# Patient Record
Sex: Male | Born: 1965 | Race: White | Hispanic: No | Marital: Married | Smoking: Never smoker
Health system: Southern US, Community
[De-identification: ages and names within clinical notes are randomized; demographics above are authoritative.]

## PROBLEM LIST (undated history)

## (undated) DIAGNOSIS — E119 Type 2 diabetes mellitus without complications: Secondary | ICD-10-CM

## (undated) DIAGNOSIS — L719 Rosacea, unspecified: Secondary | ICD-10-CM

## (undated) DIAGNOSIS — E785 Hyperlipidemia, unspecified: Secondary | ICD-10-CM

## (undated) DIAGNOSIS — F419 Anxiety disorder, unspecified: Secondary | ICD-10-CM

## (undated) DIAGNOSIS — I10 Essential (primary) hypertension: Secondary | ICD-10-CM

## (undated) DIAGNOSIS — K219 Gastro-esophageal reflux disease without esophagitis: Secondary | ICD-10-CM

## (undated) DIAGNOSIS — T7840XA Allergy, unspecified, initial encounter: Secondary | ICD-10-CM

## (undated) DIAGNOSIS — H269 Unspecified cataract: Secondary | ICD-10-CM

## (undated) HISTORY — DX: Allergy, unspecified, initial encounter: T78.40XA

## (undated) HISTORY — PX: CHOLECYSTECTOMY: SHX55

## (undated) HISTORY — DX: Gastro-esophageal reflux disease without esophagitis: K21.9

## (undated) HISTORY — DX: Essential (primary) hypertension: I10

## (undated) HISTORY — DX: Anxiety disorder, unspecified: F41.9

## (undated) HISTORY — DX: Unspecified cataract: H26.9

## (undated) HISTORY — DX: Type 2 diabetes mellitus without complications: E11.9

## (undated) HISTORY — DX: Hyperlipidemia, unspecified: E78.5

## (undated) HISTORY — PX: COLONOSCOPY: SHX174

## (undated) HISTORY — DX: Rosacea, unspecified: L71.9

## (undated) HISTORY — PX: TOTAL HIP ARTHROPLASTY: SHX124

## (undated) HISTORY — PX: NASAL SINUS SURGERY: SHX719

---

## 2000-04-30 ENCOUNTER — Encounter: Payer: Self-pay | Admitting: Emergency Medicine

## 2000-04-30 ENCOUNTER — Emergency Department (HOSPITAL_COMMUNITY): Admission: EM | Admit: 2000-04-30 | Discharge: 2000-04-30 | Payer: Self-pay | Admitting: Emergency Medicine

## 2001-09-11 ENCOUNTER — Ambulatory Visit (HOSPITAL_COMMUNITY): Admission: RE | Admit: 2001-09-11 | Discharge: 2001-09-11 | Payer: Self-pay | Admitting: *Deleted

## 2001-09-11 ENCOUNTER — Encounter (INDEPENDENT_AMBULATORY_CARE_PROVIDER_SITE_OTHER): Payer: Self-pay | Admitting: *Deleted

## 2002-08-18 ENCOUNTER — Emergency Department (HOSPITAL_COMMUNITY): Admission: EM | Admit: 2002-08-18 | Discharge: 2002-08-18 | Payer: Self-pay

## 2003-05-17 ENCOUNTER — Emergency Department (HOSPITAL_COMMUNITY): Admission: EM | Admit: 2003-05-17 | Discharge: 2003-05-17 | Payer: Self-pay | Admitting: Family Medicine

## 2003-07-03 ENCOUNTER — Ambulatory Visit (HOSPITAL_COMMUNITY): Admission: RE | Admit: 2003-07-03 | Discharge: 2003-07-03 | Payer: Self-pay | Admitting: Neurology

## 2004-04-03 ENCOUNTER — Ambulatory Visit: Payer: Self-pay | Admitting: Internal Medicine

## 2004-06-26 ENCOUNTER — Ambulatory Visit: Payer: Self-pay | Admitting: Internal Medicine

## 2004-06-30 ENCOUNTER — Ambulatory Visit: Payer: Self-pay | Admitting: Internal Medicine

## 2006-11-25 ENCOUNTER — Encounter: Payer: Self-pay | Admitting: Internal Medicine

## 2006-11-25 ENCOUNTER — Ambulatory Visit: Payer: Self-pay | Admitting: Internal Medicine

## 2006-11-25 DIAGNOSIS — K219 Gastro-esophageal reflux disease without esophagitis: Secondary | ICD-10-CM | POA: Insufficient documentation

## 2006-11-25 DIAGNOSIS — J45909 Unspecified asthma, uncomplicated: Secondary | ICD-10-CM | POA: Insufficient documentation

## 2006-11-25 DIAGNOSIS — I10 Essential (primary) hypertension: Secondary | ICD-10-CM | POA: Insufficient documentation

## 2006-11-25 DIAGNOSIS — F411 Generalized anxiety disorder: Secondary | ICD-10-CM | POA: Insufficient documentation

## 2006-11-25 DIAGNOSIS — J309 Allergic rhinitis, unspecified: Secondary | ICD-10-CM | POA: Insufficient documentation

## 2007-06-14 ENCOUNTER — Ambulatory Visit: Payer: Self-pay | Admitting: Internal Medicine

## 2007-06-16 LAB — CONVERTED CEMR LAB
ALT: 68 units/L — ABNORMAL HIGH (ref 0–53)
AST: 39 units/L — ABNORMAL HIGH (ref 0–37)
Albumin: 4.2 g/dL (ref 3.5–5.2)
Alkaline Phosphatase: 50 units/L (ref 39–117)
BUN: 12 mg/dL (ref 6–23)
Basophils Absolute: 0 10*3/uL (ref 0.0–0.1)
Basophils Relative: 0.8 % (ref 0.0–1.0)
Bilirubin Urine: NEGATIVE
Bilirubin, Direct: 0.1 mg/dL (ref 0.0–0.3)
CO2: 31 meq/L (ref 19–32)
Calcium: 9.2 mg/dL (ref 8.4–10.5)
Chloride: 107 meq/L (ref 96–112)
Cholesterol: 212 mg/dL (ref 0–200)
Creatinine, Ser: 1.1 mg/dL (ref 0.4–1.5)
Direct LDL: 140.1 mg/dL
Eosinophils Absolute: 0.2 10*3/uL (ref 0.0–0.7)
Eosinophils Relative: 5.9 % — ABNORMAL HIGH (ref 0.0–5.0)
GFR calc Af Amer: 94 mL/min
GFR calc non Af Amer: 78 mL/min
Glucose, Bld: 112 mg/dL — ABNORMAL HIGH (ref 70–99)
HCT: 43 % (ref 39.0–52.0)
HDL: 39 mg/dL (ref >39.0)
Hemoglobin, Urine: NEGATIVE
Hemoglobin: 14.6 g/dL (ref 13.0–17.0)
Ketones, ur: NEGATIVE mg/dL
Leukocytes, UA: NEGATIVE
Lymphocytes Relative: 33.5 % (ref 12.0–46.0)
MCHC: 33.9 g/dL (ref 30.0–36.0)
MCV: 89.8 fL (ref 78.0–100.0)
Monocytes Absolute: 0.7 10*3/uL (ref 0.1–1.0)
Monocytes Relative: 16.5 % — ABNORMAL HIGH (ref 3.0–12.0)
Neutro Abs: 1.9 10*3/uL (ref 1.4–7.7)
Neutrophils Relative %: 43.3 % (ref 43.0–77.0)
Nitrite: NEGATIVE
Platelets: 195 10*3/uL (ref 150–400)
Potassium: 4.1 meq/L (ref 3.5–5.1)
RBC: 4.79 M/uL (ref 4.22–5.81)
RDW: 11.9 % (ref 11.5–14.6)
Sodium: 141 meq/L (ref 135–145)
Specific Gravity, Urine: 1.015 (ref 1.000–1.03)
Total Bilirubin: 0.9 mg/dL (ref 0.3–1.2)
Total CHOL/HDL Ratio: 5.4
Total Protein, Urine: NEGATIVE mg/dL
Total Protein: 7 g/dL (ref 6.0–8.3)
Triglycerides: 129 mg/dL (ref 0–149)
Urine Glucose: NEGATIVE mg/dL
Urobilinogen, UA: 0.2 (ref 0.0–1.0)
VLDL: 26 mg/dL (ref 0–40)
WBC: 4.2 10*3/uL — ABNORMAL LOW (ref 4.5–10.5)
pH: 7 (ref 5.0–8.0)

## 2007-06-21 ENCOUNTER — Ambulatory Visit: Payer: Self-pay | Admitting: Internal Medicine

## 2007-06-21 DIAGNOSIS — R945 Abnormal results of liver function studies: Secondary | ICD-10-CM | POA: Insufficient documentation

## 2007-06-21 DIAGNOSIS — R7309 Other abnormal glucose: Secondary | ICD-10-CM | POA: Insufficient documentation

## 2007-06-21 DIAGNOSIS — D485 Neoplasm of uncertain behavior of skin: Secondary | ICD-10-CM | POA: Insufficient documentation

## 2007-07-31 ENCOUNTER — Encounter: Payer: Self-pay | Admitting: Internal Medicine

## 2007-07-31 ENCOUNTER — Ambulatory Visit: Payer: Self-pay | Admitting: Internal Medicine

## 2007-07-31 DIAGNOSIS — L570 Actinic keratosis: Secondary | ICD-10-CM | POA: Insufficient documentation

## 2007-11-01 ENCOUNTER — Telehealth: Payer: Self-pay | Admitting: Internal Medicine

## 2007-11-15 ENCOUNTER — Telehealth: Payer: Self-pay | Admitting: Internal Medicine

## 2007-11-29 ENCOUNTER — Encounter: Payer: Self-pay | Admitting: Internal Medicine

## 2008-01-15 ENCOUNTER — Telehealth: Payer: Self-pay | Admitting: Internal Medicine

## 2008-04-16 ENCOUNTER — Telehealth: Payer: Self-pay | Admitting: Internal Medicine

## 2008-05-09 ENCOUNTER — Ambulatory Visit: Payer: Self-pay | Admitting: Family Medicine

## 2008-05-14 ENCOUNTER — Telehealth: Payer: Self-pay | Admitting: Internal Medicine

## 2008-06-26 ENCOUNTER — Telehealth: Payer: Self-pay | Admitting: Internal Medicine

## 2009-01-23 ENCOUNTER — Ambulatory Visit: Payer: Self-pay | Admitting: Internal Medicine

## 2009-01-27 LAB — CONVERTED CEMR LAB
ALT: 76 units/L — ABNORMAL HIGH (ref 0–53)
AST: 47 units/L — ABNORMAL HIGH (ref 0–37)
Albumin: 4.1 g/dL (ref 3.5–5.2)
Alkaline Phosphatase: 37 units/L — ABNORMAL LOW (ref 39–117)
BUN: 8 mg/dL (ref 6–23)
Basophils Absolute: 0 10*3/uL (ref 0.0–0.1)
Basophils Relative: 0.6 % (ref 0.0–3.0)
Bilirubin Urine: NEGATIVE
Bilirubin, Direct: 0.1 mg/dL (ref 0.0–0.3)
CO2: 29 meq/L (ref 19–32)
Calcium: 9.3 mg/dL (ref 8.4–10.5)
Chloride: 102 meq/L (ref 96–112)
Cholesterol: 230 mg/dL — ABNORMAL HIGH (ref 0–200)
Creatinine, Ser: 1 mg/dL (ref 0.4–1.5)
Direct LDL: 118.7 mg/dL
Eosinophils Absolute: 0.4 10*3/uL (ref 0.0–0.7)
Eosinophils Relative: 6.3 % — ABNORMAL HIGH (ref 0.0–5.0)
GFR calc non Af Amer: 86.24 mL/min (ref >60)
Glucose, Bld: 107 mg/dL — ABNORMAL HIGH (ref 70–99)
HCT: 44.2 % (ref 39.0–52.0)
HDL: 44.1 mg/dL (ref >39.00)
Hemoglobin: 15.2 g/dL (ref 13.0–17.0)
Ketones, ur: NEGATIVE mg/dL
Leukocytes, UA: NEGATIVE
Lymphocytes Relative: 39 % (ref 12.0–46.0)
Lymphs Abs: 2.2 10*3/uL (ref 0.7–4.0)
MCHC: 34.3 g/dL (ref 30.0–36.0)
MCV: 91.8 fL (ref 78.0–100.0)
Monocytes Absolute: 0.6 10*3/uL (ref 0.1–1.0)
Monocytes Relative: 11.4 % (ref 3.0–12.0)
Neutro Abs: 2.5 10*3/uL (ref 1.4–7.7)
Neutrophils Relative %: 42.7 % — ABNORMAL LOW (ref 43.0–77.0)
Nitrite: NEGATIVE
Platelets: 172 10*3/uL (ref 150.0–400.0)
Potassium: 3.4 meq/L — ABNORMAL LOW (ref 3.5–5.1)
RBC: 4.82 M/uL (ref 4.22–5.81)
RDW: 11.7 % (ref 11.5–14.6)
Sodium: 140 meq/L (ref 135–145)
Specific Gravity, Urine: 1.02 (ref 1.000–1.030)
TSH: 2.67 microintl units/mL (ref 0.35–5.50)
Total Bilirubin: 0.9 mg/dL (ref 0.3–1.2)
Total CHOL/HDL Ratio: 5
Total Protein, Urine: NEGATIVE mg/dL
Total Protein: 7.2 g/dL (ref 6.0–8.3)
Triglycerides: 506 mg/dL — ABNORMAL HIGH (ref 0.0–149.0)
Urine Glucose: NEGATIVE mg/dL
Urobilinogen, UA: 0.2 (ref 0.0–1.0)
VLDL: 101.2 mg/dL — ABNORMAL HIGH (ref 0.0–40.0)
WBC: 5.7 10*3/uL (ref 4.5–10.5)
pH: 6 (ref 5.0–8.0)

## 2009-04-01 ENCOUNTER — Telehealth: Payer: Self-pay | Admitting: Internal Medicine

## 2009-05-01 ENCOUNTER — Telehealth: Payer: Self-pay | Admitting: Internal Medicine

## 2009-07-22 ENCOUNTER — Telehealth: Payer: Self-pay | Admitting: Internal Medicine

## 2009-10-02 ENCOUNTER — Telehealth: Payer: Self-pay | Admitting: Internal Medicine

## 2009-12-29 ENCOUNTER — Ambulatory Visit: Payer: Self-pay | Admitting: Internal Medicine

## 2009-12-29 DIAGNOSIS — R1011 Right upper quadrant pain: Secondary | ICD-10-CM | POA: Insufficient documentation

## 2009-12-30 LAB — CONVERTED CEMR LAB
ALT: 66 units/L — ABNORMAL HIGH (ref 0–53)
AST: 40 units/L — ABNORMAL HIGH (ref 0–37)
Albumin: 4.6 g/dL (ref 3.5–5.2)
Alkaline Phosphatase: 39 units/L (ref 39–117)
BUN: 20 mg/dL (ref 6–23)
Basophils Absolute: 0 10*3/uL (ref 0.0–0.1)
Basophils Relative: 0.6 % (ref 0.0–3.0)
Bilirubin Urine: NEGATIVE
Bilirubin, Direct: 0.1 mg/dL (ref 0.0–0.3)
CO2: 27 meq/L (ref 19–32)
Calcium: 9.4 mg/dL (ref 8.4–10.5)
Chloride: 99 meq/L (ref 96–112)
Creatinine, Ser: 1 mg/dL (ref 0.4–1.5)
Eosinophils Absolute: 0.5 10*3/uL (ref 0.0–0.7)
Eosinophils Relative: 8 % — ABNORMAL HIGH (ref 0.0–5.0)
GFR calc non Af Amer: 84.89 mL/min (ref >60)
Glucose, Bld: 94 mg/dL (ref 70–99)
HCT: 43.9 % (ref 39.0–52.0)
Hemoglobin: 15.4 g/dL (ref 13.0–17.0)
Ketones, ur: NEGATIVE mg/dL
Leukocytes, UA: NEGATIVE
Lipase: 33 units/L (ref 11.0–59.0)
Lymphocytes Relative: 29 % (ref 12.0–46.0)
Lymphs Abs: 1.9 10*3/uL (ref 0.7–4.0)
MCHC: 35.1 g/dL (ref 30.0–36.0)
MCV: 90 fL (ref 78.0–100.0)
Monocytes Absolute: 0.7 10*3/uL (ref 0.1–1.0)
Monocytes Relative: 10 % (ref 3.0–12.0)
Neutro Abs: 3.4 10*3/uL (ref 1.4–7.7)
Neutrophils Relative %: 52.4 % (ref 43.0–77.0)
Nitrite: NEGATIVE
Platelets: 187 10*3/uL (ref 150.0–400.0)
Potassium: 4.2 meq/L (ref 3.5–5.1)
RBC: 4.88 M/uL (ref 4.22–5.81)
RDW: 12.5 % (ref 11.5–14.6)
Sed Rate: 5 mm/hr (ref 0–22)
Sodium: 135 meq/L (ref 135–145)
Specific Gravity, Urine: 1.02 (ref 1.000–1.030)
Total Bilirubin: 0.8 mg/dL (ref 0.3–1.2)
Total Protein, Urine: NEGATIVE mg/dL
Total Protein: 7.4 g/dL (ref 6.0–8.3)
Urine Glucose: NEGATIVE mg/dL
Urobilinogen, UA: 0.2 (ref 0.0–1.0)
WBC: 6.6 10*3/uL (ref 4.5–10.5)
pH: 5 (ref 5.0–8.0)

## 2010-01-05 ENCOUNTER — Encounter
Admission: RE | Admit: 2010-01-05 | Discharge: 2010-01-05 | Payer: Self-pay | Source: Home / Self Care | Attending: Internal Medicine | Admitting: Internal Medicine

## 2010-01-08 ENCOUNTER — Telehealth: Payer: Self-pay | Admitting: Internal Medicine

## 2010-01-08 DIAGNOSIS — K8020 Calculus of gallbladder without cholecystitis without obstruction: Secondary | ICD-10-CM | POA: Insufficient documentation

## 2010-02-18 ENCOUNTER — Encounter: Payer: Self-pay | Admitting: Internal Medicine

## 2010-02-24 NOTE — Progress Notes (Signed)
Summary: Rf Clonazepam  Phone Note Refill Request Message from:  Pharmacy  Refills Requested: Medication #1:  KLONOPIN 0.5 MG  TABS 2 times a day as needed   Dosage confirmed as above?Dosage Confirmed   Supply Requested: 60   Last Refilled: 04/06/2009  Method Requested: Telephone to Pharmacy Next Appointment Scheduled: none Initial call taken by: Lanier Prude, [facility]),  October 02, 2009 9:15 AM  Follow-up for Phone Call        ok  3 ref Follow-up by: Tresa Garter MD,  October 02, 2009 12:38 PM    Prescriptions: KLONOPIN 0.5 MG  TABS (CLONAZEPAM) 2 times a day as needed  #60 x 3   Entered by:   Lamar Sprinkles, CMA   Authorized by:   Tresa Garter MD   Signed by:   Lamar Sprinkles, CMA on 10/02/2009   Method used:   Telephoned to ...       [facility] (retail)       [address] [place], [place]  [address]       Ph: [phone]       Fax: [phone]   RxID:   [id number]

## 2010-02-24 NOTE — Progress Notes (Signed)
  Phone Note Refill Request Message from:  Fax from Pharmacy on April 01, 2009 9:51 AM  Refills Requested: Medication #1:  FLOVENT HFA 110 MCG/ACT  AERO 2 puffs qd   Dosage confirmed as above?Dosage Confirmed   Supply Requested: 3 months  Method Requested: Fax to [facility] Initial call taken by: Rock Nephew CMA,  April 01, 2009 9:51 AM    Prescriptions: FLOVENT HFA 110 MCG/ACT  AERO (FLUTICASONE PROPIONATE  HFA) 2 puffs qd  #3 x 3   Entered by:   Rock Nephew CMA   Authorized by:   Tresa Garter MD   Signed by:   Rock Nephew CMA on 04/01/2009   Method used:   Faxed to ...       [facility]* (mail-order)             ,          Ph: [phone]       Fax: [phone]   RxID:   [id number]

## 2010-02-24 NOTE — Progress Notes (Signed)
Summary: diovan & flonase  Phone Note Refill Request Message from:  Fax from Pharmacy on May 01, 2009 10:47 AM  Refills Requested: Medication #1:  DIOVAN HCT 160-12.5 MG  TABS one by mouth every morning  Medication #2:  FLONASE 50 MCG/ACT SUSP 2 spr qd [facility] [phone] Last ov 01/24/09  Initial call taken by: Orlan Leavens,  May 01, 2009 10:47 AM    Prescriptions: DIOVAN HCT 160-12.5 MG  TABS (VALSARTAN-HYDROCHLOROTHIAZIDE) one by mouth every morning  #90 x 2   Entered by:   Orlan Leavens   Authorized by:   Tresa Garter MD   Signed by:   Orlan Leavens on 05/01/2009   Method used:   Faxed to ...       [facility]* (mail-order)             ,          Ph: [phone]       Fax: [phone]   RxID:   [id number] FLONASE 50 MCG/ACT SUSP (FLUTICASONE PROPIONATE) 2 spr qd  #3 x 2   Entered by:   Orlan Leavens   Authorized by:   Tresa Garter MD   Signed by:   Orlan Leavens on 05/01/2009   Method used:   Faxed to ...       [facility]* (mail-order)             ,          Ph: [phone]       Fax: [phone]   RxID:   [id number]

## 2010-02-24 NOTE — Assessment & Plan Note (Signed)
Summary: PER PT D/T--MID TO LOWER R SIDE BACK PAIN X SEVERAL WK  --STC   Vital Signs:  Patient profile:   45 year old male Height:      72 inches Weight:      200 pounds BMI:     27.22 Temp:     98.6 degrees F oral Pulse rate:   80 / minute Pulse rhythm:   regular Resp:     16 per minute BP sitting:   120 / 88  (left arm) Cuff size:   regular  Vitals Entered By: Lanier Prude, CMA(AAMA) (December 29, 2009 8:33 AM) CC: Rt side abd/back pain X 2 wks Is Patient Diabetic? No   CC:  Rt side abd/back pain X 2 wks.  History of Present Illness: C/o R midback and RUQ pain x x months. 2 wks ago it got worse w/eating and physical activity  Current Medications (verified): 1)  Nexium 40 Mg Cpdr (Esomeprazole Magnesium) .Marland Kitchen.. 1 Qam 2)  Fluoxetine Hcl 10 Mg  Caps (Fluoxetine Hcl) .... Qd 3)  Klonopin 0.5 Mg  Tabs (Clonazepam) .... 2 Times A Day As Needed 4)  Flonase 50 Mcg/act Susp (Fluticasone Propionate) .... 2 Spr Qd 5)  Flovent Hfa 110 Mcg/act  Aero (Fluticasone Propionate  Hfa) .... 2 Puffs Qd 6)  Diovan Hct 160-12.5 Mg  Tabs (Valsartan-Hydrochlorothiazide) .... One By Mouth Every Morning 7)  Vitamin D 1000 Unit Tabs (Cholecalciferol) .Marland Kitchen.. 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    Physical Exam  General:  well developed, well nourished, no acute distress Nose:  External  nasal examination shows no deformity or inflammation. Nasal mucosa are pink and moist without lesions or exudates. Mouth:  Oral mucosa and oropharynx without lesions or exudates.  Teeth in good repair. Lungs:  Normal respiratory effort, chest expands symmetrically. Lungs are clear to auscultation, no crackles or wheezes. Heart:  Normal rate and regular rhythm. S1 and S2 normal without gallop, murmur, click, rub or other extra sounds. Abdomen:  Bowel sounds positive,abdomen soft and non-tender without masses, organomegaly or hernias noted. Sensitive in RUQ, no mass. Msk:  No deformity or scoliosis noted of thoracic or lumbar spine.   Neurologic:  No cranial nerve deficits noted. Station and gait are normal. Plantar reflexes are down-going bilaterally. DTRs are symmetrical throughout. Sensory, motor and coordinative functions appear intact. Skin:  no obvious rashes or lesions Psych:  Cognition and judgment appear intact. Alert and cooperative with normal attention span and concentration. No apparent delusions, illusions, hallucinations   Impression & Recommendations:  Problem # 1:  RUQ PAIN (ICD-789.01) - r/o GS vs MSK vs other Assessment New  Orders: T-Helicobacter AB - IgG (63016-01093) Radiology Referral (Radiology) abd [place] TLB-Hepatic/Liver Function Pnl (80076-HEPATIC) TLB-Lipase (83690-LIPASE) TLB-BMP (Basic Metabolic Panel-BMET) (80048-METABOL) TLB-CBC Platelet - w/Differential (85025-CBCD) TLB-Sedimentation Rate (ESR) (85652-ESR) TLB-Udip ONLY (81003-UDIP)  Problem # 2:  GERD (ICD-530.81) Assessment: Deteriorated  His updated medication list for this problem includes:    Nexium 40 Mg Cpdr (  Esomeprazole magnesium) .Marland Kitchen... 1 qam  Complete Medication List: 1)  Nexium 40 Mg Cpdr (Esomeprazole magnesium) .Marland Kitchen.. 1 qam 2)  Fluoxetine Hcl 10 Mg Caps (Fluoxetine hcl) .... Qd 3)  Klonopin 0.5 Mg Tabs (Clonazepam) .... 2 times a day as needed 4)  Flonase 50 Mcg/act Susp (Fluticasone  propionate) .... 2 spr qd 5)  Flovent Hfa 110 Mcg/act Aero (Fluticasone propionate  hfa) .... 2 puffs qd 6)  Diovan Hct 160-12.5 Mg Tabs (Valsartan-hydrochlorothiazide) .... One by mouth every morning 7)  Vitamin D 1000 Unit Tabs (Cholecalciferol) .Marland Kitchen.. 1 by mouth qd 8)  Ciprofloxacin Hcl 500 Mg Tabs (Ciprofloxacin hcl) .Marland Kitchen.. 1 by mouth bid  Patient Instructions: 1)  Call if you are not better in a reasonable amount of time or if worse. Go to ER if feeling really bad!  2)  Avoid fatty, spicy foods for now 3)  Please schedule a follow-up appointment in 6 months well w/labs. Prescriptions: CIPROFLOXACIN HCL 500 MG TABS (CIPROFLOXACIN HCL) 1 by mouth bid  #20 x 1   Entered and Authorized by:   Tresa Garter MD   Signed by:   Tresa Garter MD on 12/29/2009   Method used:   Print then Give to Patient   RxID:   [id number]    Orders Added: 1)  T-Helicobacter AB - IgG [[id number]] 2)  Est. Patient Level IV [84132] 3)  Radiology Referral [Radiology] 4)  TLB-Hepatic/Liver Function Pnl [80076-HEPATIC] 5)  TLB-Lipase [83690-LIPASE] 6)  TLB-BMP (Basic Metabolic Panel-BMET) [80048-METABOL] 7)  TLB-CBC Platelet - w/Differential [85025-CBCD] 8)  TLB-Sedimentation Rate (ESR) [85652-ESR] 9)  TLB-Udip ONLY [81003-UDIP]

## 2010-02-24 NOTE — Progress Notes (Signed)
  Phone Note Refill Request Message from:  Fax from Pharmacy on July 22, 2009 8:47 AM  Refills Requested: Medication #1:  NEXIUM 40 MG CPDR 1 qam pt needs 90 day supply faxed to [facility]  Initial call taken by: Ami Bullins CMA,  July 22, 2009 8:47 AM    Prescriptions: NEXIUM 40 MG CPDR (ESOMEPRAZOLE MAGNESIUM) 1 qam  #90 x 3   Entered by:   Ami Bullins CMA   Authorized by:   Tresa Garter MD   Signed by:   Bill Salinas CMA on 07/22/2009   Method used:   Electronically to        [facility]* (retail)             ,          Ph: [phone]       Fax: [phone]   RxID:   [id number] NEXIUM 40 MG CPDR (ESOMEPRAZOLE MAGNESIUM) 1 qam  #90 x 3   Entered by:   Bill Salinas CMA   Authorized by:   Jacques Navy MD   Signed by:   Bill Salinas CMA on 07/22/2009   Method used:   Electronically to        [facility]* (retail)             ,          Ph: [phone]       Fax: [phone]   RxID:   [id number]

## 2010-02-26 NOTE — Progress Notes (Signed)
Summary: RESULTS  Phone Note Call from Patient Call back at [phone]   Summary of Call: Patient is requesting results of u/s.  Initial call taken by: Lamar Sprinkles, CMA,  January 08, 2010 2:22 PM  Follow-up for Phone Call        Gallstones on [place] - he needs to see a surgeon. Who would he like to see? Follow-up by: Tresa Garter MD,  January 09, 2010 12:04 PM  Additional Follow-up for Phone Call Additional follow up Details #1::        Pt does not have any surgeon in mind. He does prefer to go to [facility].  Additional Follow-up by: Lamar Sprinkles, CMA,  January 09, 2010 2:23 PM  New Problems: CHOLELITHIASIS [phone])   Additional Follow-up for Phone Call Additional follow up Details #2::    ok Follow-up by: Tresa Garter MD,  January 09, 2010 5:25 PM  New Problems: CHOLELITHIASIS [phone])

## 2010-03-12 NOTE — Letter (Signed)
Summary: [facility]   Imported By: Sherian Rein 03/04/2010 11:06:51  _____________________________________________________________________  External Attachment:    Type:   Image     Comment:   External Document

## 2010-06-29 ENCOUNTER — Encounter: Payer: Self-pay | Admitting: Internal Medicine

## 2010-06-30 ENCOUNTER — Ambulatory Visit (INDEPENDENT_AMBULATORY_CARE_PROVIDER_SITE_OTHER): Payer: BC Managed Care – PPO | Admitting: Internal Medicine

## 2010-06-30 ENCOUNTER — Encounter: Payer: Self-pay | Admitting: Internal Medicine

## 2010-06-30 DIAGNOSIS — R197 Diarrhea, unspecified: Secondary | ICD-10-CM

## 2010-06-30 DIAGNOSIS — K915 Postcholecystectomy syndrome: Secondary | ICD-10-CM | POA: Insufficient documentation

## 2010-06-30 MED ORDER — PANCRELIPASE (LIP-PROT-AMYL) 20000-68000 UNITS PO CPEP: 1.0000 | ORAL_CAPSULE | Freq: Three times a day (TID) | ORAL | Status: DC

## 2010-06-30 MED ORDER — CLONAZEPAM 0.5 MG PO TABS: 0.5000 mg | ORAL_TABLET | Freq: Every evening | ORAL | Status: DC | PRN

## 2010-06-30 MED ORDER — METRONIDAZOLE 500 MG PO TABS: 500.0000 mg | ORAL_TABLET | Freq: Three times a day (TID) | ORAL | Status: DC

## 2010-06-30 MED ORDER — ALIGN 4 MG PO CAPS: 1.0000 | ORAL_CAPSULE | Freq: Every day | ORAL | Status: DC

## 2010-06-30 MED ORDER — METRONIDAZOLE 500 MG PO TABS: 500.0000 mg | ORAL_TABLET | Freq: Three times a day (TID) | ORAL | Status: AC

## 2010-06-30 NOTE — Progress Notes (Signed)
  Subjective:    Patient ID: Darrell Santos, male    DOB: 1965/05/30, 45 y.o.   MRN: [id number]  HPI   The patient is here to follow up on chronic depression, anxiety, headaches and chronic moderate fibromyalgia symptoms controlled with medicines, diet and exercise.   Review of Systems  Constitutional: Negative for appetite change, fatigue and unexpected weight change.  HENT: Negative for nosebleeds, congestion, sore throat, sneezing, trouble swallowing and neck pain.   Eyes: Negative for itching and visual disturbance.  Respiratory: Negative for cough.   Cardiovascular: Negative for chest pain, palpitations and leg swelling.  Gastrointestinal: Positive for diarrhea (after meals and gas). Negative for nausea, blood in stool and abdominal distention.  Genitourinary: Negative for frequency and hematuria.  Musculoskeletal: Negative for back pain, joint swelling and gait problem.  Skin: Negative for rash.  Neurological: Negative for dizziness, tremors, speech difficulty and weakness.  Psychiatric/Behavioral: Negative for sleep disturbance, dysphoric mood and agitation. The patient is not nervous/anxious.        Objective:   Physical Exam  Constitutional: He is oriented to person, place, and time. He appears well-developed.  HENT:  Mouth/Throat: Oropharynx is clear and moist.  Eyes: Conjunctivae are normal. Pupils are equal, round, and reactive to light.  Neck: Normal range of motion. No JVD present. No thyromegaly present.  Cardiovascular: Normal rate, regular rhythm, normal heart sounds and intact distal pulses.  Exam reveals no gallop and no friction rub.   No murmur heard. Pulmonary/Chest: Effort normal and breath sounds normal. No respiratory distress. He has no wheezes. He has no rales. He exhibits no tenderness.  Abdominal: Soft. Bowel sounds are normal. He exhibits no distension and no mass. There is no tenderness. There is no rebound and no guarding.  Musculoskeletal: Normal  range of motion. He exhibits no edema and no tenderness.  Lymphadenopathy:    He has no cervical adenopathy.  Neurological: He is alert and oriented to person, place, and time. He has normal reflexes. No cranial nerve deficit. He exhibits normal muscle tone. Coordination normal.  Skin: Skin is warm and dry. No rash noted.  Psychiatric: He has a normal mood and affect. His behavior is normal. Judgment and thought content normal.          Assessment & Plan:

## 2010-06-30 NOTE — Patient Instructions (Signed)
Take Align x 1-2 wks after you finished Metronidazole

## 2010-06-30 NOTE — Assessment & Plan Note (Signed)
Ty Flagyl and Align Use enzymes

## 2010-09-02 ENCOUNTER — Other Ambulatory Visit: Payer: Self-pay | Admitting: Internal Medicine

## 2010-09-24 ENCOUNTER — Other Ambulatory Visit: Payer: Self-pay | Admitting: Internal Medicine

## 2010-12-07 ENCOUNTER — Other Ambulatory Visit: Payer: Self-pay | Admitting: *Deleted

## 2010-12-07 MED ORDER — VALSARTAN-HYDROCHLOROTHIAZIDE 160-12.5 MG PO TABS: 1.0000 | ORAL_TABLET | Freq: Every day | ORAL | Status: DC

## 2010-12-09 ENCOUNTER — Telehealth: Payer: Self-pay | Admitting: *Deleted

## 2010-12-09 NOTE — Telephone Encounter (Signed)
[facility] needs clarification on Diovan Rx Reference [id number]

## 2010-12-12 ENCOUNTER — Emergency Department (INDEPENDENT_AMBULATORY_CARE_PROVIDER_SITE_OTHER)
Admission: EM | Admit: 2010-12-12 | Discharge: 2010-12-12 | Disposition: A | Discharge status: Home / Self Care | Payer: BC Managed Care – PPO | Source: Home / Self Care | Attending: Emergency Medicine | Admitting: Emergency Medicine

## 2010-12-12 DIAGNOSIS — J329 Chronic sinusitis, unspecified: Secondary | ICD-10-CM

## 2010-12-12 MED ORDER — AMOXICILLIN-POT CLAVULANATE 875-125 MG PO TABS: 1.0000 | ORAL_TABLET | Freq: Two times a day (BID) | ORAL | Status: AC

## 2010-12-12 NOTE — ED Provider Notes (Signed)
History     CSN: [id number] Arrival date & time: 12/12/2010  4:32 PM   First MD Initiated Contact with Patient 12/12/10 1633      Chief Complaint  Patient presents with  . Sinusitis  . URI    (Consider location/radiation/quality/duration/timing/severity/associated sxs/prior treatment) HPI Dontrelle is a 45 y.o. male who complains of onset of cold symptoms for a months. He is taking Claritin & Flonase.  This feels different than his normal allergies.  He was getting better, then acutely got worse with facial pressure. No sore throat + cough No pleuritic pain No wheezing + nasal congestion + post-nasal drainage + sinus pain/pressure No chest congestion No itchy/red eyes No earache No hemoptysis No SOB No chills/sweats No fever No nausea No vomiting No abdominal pain No diarrhea No skin rashes No fatigue No myalgias No headache     Past Medical History  Diagnosis Date  . Anxiety   . GERD (gastroesophageal reflux disease)   . HTN (hypertension)   . Rosacea   . Allergic rhinitis   . Asthma     Past Surgical History  Procedure Date  . Nasal sinus surgery     Family History  Problem Relation Age of Onset  . Hypertension Other   . Diabetes Other     1st degree relative     History  Substance Use Topics  . Smoking status: Never Smoker   . Smokeless tobacco: Not on file  . Alcohol Use: 7.0 oz/week    14 drink(s) per week     2/day      Review of Systems  Allergies  Review of patient's allergies indicates no known allergies.  Home Medications   Current Outpatient Rx  Name Route Sig Dispense Refill  . VITAMIN D 1000 UNITS PO CAPS Oral Take 1,000 Units by mouth daily.      Marland Kitchen CLONAZEPAM 0.5 MG PO TABS Oral Take 1 tablet (0.5 mg total) by mouth at bedtime as needed. 90 tablet 1  . FLOVENT HFA 110 MCG/ACT IN AERO  INHALE 2 PUFFS DAILY 3 g 2  . FLUOXETINE HCL 10 MG PO CAPS Oral Take 10 mg by mouth daily.      Marland Kitchen FLUTICASONE PROPIONATE 50 MCG/ACT NA  SUSP Nasal 2 sprays by Nasal route daily.      Marland Kitchen NEXIUM 40 MG PO CPDR  TAKE 1 CAPSULE EVERY MORNING 90 capsule 2  . PANCRELIPASE (LIP-PROT-AMYL) 20000 UNITS PO CPEP Oral Take 1 capsule (20,000 Units total) by mouth 3 (three) times daily before meals. 180 capsule 6  . ALIGN 4 MG PO CAPS Oral Take 1 capsule by mouth daily. 30 capsule 1  . VALSARTAN-HYDROCHLOROTHIAZIDE 160-12.5 MG PO TABS Oral Take 1 tablet by mouth daily. BRAND NAME ONLY 90 tablet 3    There were no vitals taken for this visit.  Physical Exam  Nursing note and vitals reviewed. Constitutional: He is oriented to person, place, and time. He appears well-developed and well-nourished.  HENT:  Head: Normocephalic and atraumatic.  Right Ear: Tympanic membrane, external ear and ear canal normal.  Left Ear: Tympanic membrane, external ear and ear canal normal.  Nose: Mucosal edema and rhinorrhea present.  Mouth/Throat: Posterior oropharyngeal erythema present. No oropharyngeal exudate or posterior oropharyngeal edema.  Neck: Neck supple.  Cardiovascular: Regular rhythm and normal heart sounds.   Pulmonary/Chest: Effort normal and breath sounds normal. No respiratory distress.  Neurological: He is alert and oriented to person, place, and time.  Skin: Skin is warm  and dry.  Psychiatric: He has a normal mood and affect. His speech is normal.    ED Course  Procedures (including critical care time)  Labs Reviewed - No data to display No results found.   No diagnosis found.    MDM   1)  Take the prescribed antibiotic as instructed. 2)  Use nasal saline solution (over the counter) at least 3 times a day. 3)  Use over the counter decongestants like Zyrtec-D every 12 hours as needed to help with congestion.  If you have hypertension, do not take medicines with sudafed.  4)  Can take tylenol every 6 hours or motrin every 8 hours for pain or fever. 5)  Follow up with your primary doctor if no improvement in 5-7 days, sooner if  increasing pain, fever, or new symptoms.     Lily Kocher, MD 12/12/10 [phone]

## 2010-12-12 NOTE — ED Notes (Signed)
STATES SYMPTOMS STARTED ONE MONTH AGO. W/OUT RELIEF FROM OTC MEDS

## 2010-12-14 NOTE — Telephone Encounter (Signed)
Caller needs clarification on last Diovan. It was last filled for [facility]. I called pt to clarify this. He states he has to take the brand name due to trouble swallowing the generic form.

## 2010-12-15 ENCOUNTER — Telehealth: Payer: Self-pay | Admitting: Emergency Medicine

## 2011-01-21 ENCOUNTER — Other Ambulatory Visit: Payer: Self-pay | Admitting: *Deleted

## 2011-01-21 MED ORDER — FLUTICASONE PROPIONATE 50 MCG/ACT NA SUSP: 2.0000 | Freq: Every day | NASAL | Status: DC

## 2011-01-21 NOTE — Telephone Encounter (Signed)
Pt is requesting refill of Flonase to be sent to [facility] pharmacy.

## 2011-03-29 ENCOUNTER — Telehealth: Payer: Self-pay | Admitting: *Deleted

## 2011-03-29 NOTE — Telephone Encounter (Signed)
Per pt we need to call [phone] to advise he has to take brand name Diovan HCT 160-12.5. Called number provided and spoke to [facility]. Gave all pertinent info. Med has been approved from 02/27/11 until 03/28/12.  Left detailed mess informing pt.

## 2011-05-13 ENCOUNTER — Other Ambulatory Visit: Payer: Self-pay | Admitting: Internal Medicine

## 2011-10-19 ENCOUNTER — Other Ambulatory Visit: Payer: Self-pay | Admitting: Internal Medicine

## 2011-11-26 ENCOUNTER — Ambulatory Visit (INDEPENDENT_AMBULATORY_CARE_PROVIDER_SITE_OTHER): Payer: BC Managed Care – PPO | Admitting: Internal Medicine

## 2011-11-26 ENCOUNTER — Encounter: Payer: Self-pay | Admitting: Internal Medicine

## 2011-11-26 VITALS — BP 122/80 | HR 97 | Temp 97.0°F | Resp 16 | Wt 190.0 lb

## 2011-11-26 DIAGNOSIS — R197 Diarrhea, unspecified: Secondary | ICD-10-CM

## 2011-11-26 DIAGNOSIS — J45909 Unspecified asthma, uncomplicated: Secondary | ICD-10-CM

## 2011-11-26 DIAGNOSIS — I10 Essential (primary) hypertension: Secondary | ICD-10-CM

## 2011-11-26 DIAGNOSIS — F411 Generalized anxiety disorder: Secondary | ICD-10-CM

## 2011-11-26 MED ORDER — FLUTICASONE PROPIONATE 50 MCG/ACT NA SUSP: 2.0000 | Freq: Every day | NASAL | Status: DC

## 2011-11-26 MED ORDER — FLUTICASONE PROPIONATE HFA 220 MCG/ACT IN AERO: 1.0000 | INHALATION_SPRAY | Freq: Two times a day (BID) | RESPIRATORY_TRACT | Status: DC

## 2011-11-26 MED ORDER — CLONAZEPAM 0.5 MG PO TABS: 0.5000 mg | ORAL_TABLET | Freq: Every evening | ORAL | Status: DC | PRN

## 2011-11-26 MED ORDER — VALSARTAN-HYDROCHLOROTHIAZIDE 160-12.5 MG PO TABS: 1.0000 | ORAL_TABLET | Freq: Every day | ORAL | Status: DC

## 2011-11-26 MED ORDER — ESCITALOPRAM OXALATE 10 MG PO TABS: 10.0000 mg | ORAL_TABLET | Freq: Every day | ORAL | Status: DC

## 2011-11-26 MED ORDER — ESOMEPRAZOLE MAGNESIUM 40 MG PO CPDR: 40.0000 mg | DELAYED_RELEASE_CAPSULE | Freq: Every day | ORAL | Status: DC

## 2011-11-26 MED ORDER — AZELASTINE HCL 0.05 % OP SOLN: 1.0000 [drp] | Freq: Two times a day (BID) | OPHTHALMIC | Status: DC

## 2011-11-26 NOTE — Progress Notes (Signed)
   Subjective:    Patient ID: Darrell Santos, male    DOB: 1965-09-12, 46 y.o.   MRN: [id number]  Eye Problem  Pertinent negatives include no nausea or weakness.     The patient is here to follow up on chronic depression, anxiety, headaches and chronic moderate fibromyalgia symptoms controlled with medicines, diet and exercise.   Review of Systems  Constitutional: Negative for appetite change, fatigue and unexpected weight change.  HENT: Negative for nosebleeds, congestion, sore throat, sneezing, trouble swallowing and neck pain.   Eyes: Negative for itching and visual disturbance.  Respiratory: Negative for cough.   Cardiovascular: Negative for chest pain, palpitations and leg swelling.  Gastrointestinal: Positive for diarrhea (after meals and gas). Negative for nausea, blood in stool and abdominal distention.  Genitourinary: Negative for frequency and hematuria.  Musculoskeletal: Negative for back pain, joint swelling and gait problem.  Skin: Negative for rash.  Neurological: Negative for dizziness, tremors, speech difficulty and weakness.  Psychiatric/Behavioral: Negative for disturbed wake/sleep cycle, dysphoric mood and agitation. The patient is not nervous/anxious.        Objective:   Physical Exam  Constitutional: He is oriented to person, place, and time. He appears well-developed.  HENT:  Mouth/Throat: Oropharynx is clear and moist.  Eyes: Conjunctivae normal are normal. Pupils are equal, round, and reactive to light.  Neck: Normal range of motion. No JVD present. No thyromegaly present.  Cardiovascular: Normal rate, regular rhythm, normal heart sounds and intact distal pulses.  Exam reveals no gallop and no friction rub.   No murmur heard. Pulmonary/Chest: Effort normal and breath sounds normal. No respiratory distress. He has no wheezes. He has no rales. He exhibits no tenderness.  Abdominal: Soft. Bowel sounds are normal. He exhibits no distension and no mass.  There is no tenderness. There is no rebound and no guarding.  Musculoskeletal: Normal range of motion. He exhibits no edema and no tenderness.  Lymphadenopathy:    He has no cervical adenopathy.  Neurological: He is alert and oriented to person, place, and time. He has normal reflexes. No cranial nerve deficit. He exhibits normal muscle tone. Coordination normal.  Skin: Skin is warm and dry. No rash noted.  Psychiatric: He has a normal mood and affect. His behavior is normal. Judgment and thought content normal.          Assessment & Plan:

## 2011-11-28 ENCOUNTER — Encounter: Payer: Self-pay | Admitting: Internal Medicine

## 2011-11-28 NOTE — Assessment & Plan Note (Signed)
Lexapro Klonopin prn

## 2011-11-28 NOTE — Assessment & Plan Note (Signed)
Continue with current prescription therapy as reflected on the Med list.  

## 2011-11-28 NOTE — Assessment & Plan Note (Signed)
Stable

## 2011-12-15 ENCOUNTER — Other Ambulatory Visit: Payer: Self-pay | Admitting: Internal Medicine

## 2011-12-15 DIAGNOSIS — Z125 Encounter for screening for malignant neoplasm of prostate: Secondary | ICD-10-CM

## 2012-01-14 ENCOUNTER — Other Ambulatory Visit: Payer: Self-pay | Admitting: Internal Medicine

## 2012-03-01 ENCOUNTER — Other Ambulatory Visit: Payer: Self-pay | Admitting: Internal Medicine

## 2012-03-03 ENCOUNTER — Other Ambulatory Visit: Payer: Self-pay | Admitting: *Deleted

## 2012-03-03 MED ORDER — BECLOMETHASONE DIPROPIONATE 80 MCG/ACT IN AERS: 1.0000 | INHALATION_SPRAY | Freq: Two times a day (BID) | RESPIRATORY_TRACT | Status: DC

## 2012-03-03 NOTE — Telephone Encounter (Signed)
Left message for pt to callback office.  

## 2012-03-03 NOTE — Telephone Encounter (Signed)
Ok Thx 

## 2012-03-03 NOTE — Telephone Encounter (Signed)
Pt informed

## 2012-03-03 NOTE — Telephone Encounter (Signed)
Pt's insurance will no longer cover Flovent and he needs a rx for alternative Qvar sent to [facility] pharmacy.

## 2012-04-14 ENCOUNTER — Telehealth: Payer: Self-pay | Admitting: Internal Medicine

## 2012-04-14 NOTE — Telephone Encounter (Signed)
Completed PA form faxed to ins. Will wait on Ins decision.

## 2012-04-14 NOTE — Telephone Encounter (Signed)
Needs PA on Diovan

## 2012-04-25 ENCOUNTER — Telehealth: Payer: Self-pay | Admitting: *Deleted

## 2012-04-25 NOTE — Telephone Encounter (Signed)
Left msg on triage stating left msg yesterday wanting to get md to authorize for him to get diovan again. Insurance change to another BP med not able to swallow pill...Raechel Chute

## 2012-04-25 NOTE — Telephone Encounter (Signed)
Called insurance faxing over Pa form with case id # [id number]. Called pt no answer LMOM with PA status...lmb

## 2012-04-28 MED ORDER — DIOVAN HCT 160-12.5 MG PO TABS: 1.0000 | ORAL_TABLET | Freq: Every day | ORAL | Status: DC

## 2012-04-28 NOTE — Telephone Encounter (Signed)
Spoke with Lurena Joiner at [employer]. Diovan is approved 04/26/12 until 04/26/13.  Case Id# [id number]. Pt informed

## 2012-05-01 ENCOUNTER — Other Ambulatory Visit: Payer: Self-pay | Admitting: *Deleted

## 2012-05-01 MED ORDER — VALSARTAN-HYDROCHLOROTHIAZIDE 160-12.5 MG PO TABS: 1.0000 | ORAL_TABLET | Freq: Every day | ORAL | Status: DC

## 2012-05-01 NOTE — Telephone Encounter (Signed)
PA was approved previously from 04/26/12 to 04/26/13. Pt informed on 04/28/12.

## 2012-05-26 ENCOUNTER — Encounter: Payer: BC Managed Care – PPO | Admitting: Internal Medicine

## 2012-06-15 ENCOUNTER — Encounter: Payer: BC Managed Care – PPO | Admitting: Internal Medicine

## 2012-07-06 ENCOUNTER — Other Ambulatory Visit: Payer: Self-pay | Admitting: Internal Medicine

## 2012-07-27 ENCOUNTER — Telehealth: Payer: Self-pay | Admitting: *Deleted

## 2012-07-27 NOTE — Telephone Encounter (Signed)
Message copied by Merrilyn Puma on Thu Jul 27, 2012  9:08 AM ------      Message from: Etheleen Sia      Created: Fri Jul 07, 2012  4:35 PM      Regarding: LAB       PHYSICAL LAB FOR July 22 APPT ------

## 2012-07-27 NOTE — Telephone Encounter (Signed)
Labs entered.

## 2012-08-10 ENCOUNTER — Other Ambulatory Visit (INDEPENDENT_AMBULATORY_CARE_PROVIDER_SITE_OTHER): Payer: BC Managed Care – PPO

## 2012-08-10 DIAGNOSIS — Z125 Encounter for screening for malignant neoplasm of prostate: Secondary | ICD-10-CM

## 2012-08-10 DIAGNOSIS — R7989 Other specified abnormal findings of blood chemistry: Secondary | ICD-10-CM

## 2012-08-10 LAB — URINALYSIS, ROUTINE W REFLEX MICROSCOPIC
Bilirubin Urine: NEGATIVE
Leukocytes, UA: NEGATIVE
Nitrite: NEGATIVE
pH: 6 (ref 5.0–8.0)

## 2012-08-10 LAB — COMPREHENSIVE METABOLIC PANEL
ALT: 50 U/L (ref 0–53)
AST: 31 U/L (ref 0–37)
Albumin: 4.3 g/dL (ref 3.5–5.2)
Calcium: 9.4 mg/dL (ref 8.4–10.5)
Chloride: 101 mEq/L (ref 96–112)
Potassium: 3.8 mEq/L (ref 3.5–5.1)

## 2012-08-10 LAB — PSA: PSA: 0.45 ng/mL (ref 0.10–4.00)

## 2012-08-10 LAB — CBC WITH DIFFERENTIAL/PLATELET
Basophils Absolute: 0 10*3/uL (ref 0.0–0.1)
Eosinophils Absolute: 0.4 10*3/uL (ref 0.0–0.7)
Hemoglobin: 14.9 g/dL (ref 13.0–17.0)
Lymphocytes Relative: 37.5 % (ref 12.0–46.0)
MCHC: 34.5 g/dL (ref 30.0–36.0)
Neutro Abs: 2.7 10*3/uL (ref 1.4–7.7)
Neutrophils Relative %: 44.5 % (ref 43.0–77.0)
RDW: 12.8 % (ref 11.5–14.6)

## 2012-08-10 LAB — LIPID PANEL
Total CHOL/HDL Ratio: 4
VLDL: 78.2 mg/dL — ABNORMAL HIGH (ref 0.0–40.0)

## 2012-08-15 ENCOUNTER — Encounter: Payer: Self-pay | Admitting: Internal Medicine

## 2012-08-15 ENCOUNTER — Ambulatory Visit (INDEPENDENT_AMBULATORY_CARE_PROVIDER_SITE_OTHER): Payer: BC Managed Care – PPO | Admitting: Internal Medicine

## 2012-08-15 DIAGNOSIS — Z0000 Encounter for general adult medical examination without abnormal findings: Secondary | ICD-10-CM

## 2012-08-15 DIAGNOSIS — Z23 Encounter for immunization: Secondary | ICD-10-CM

## 2012-08-15 DIAGNOSIS — K915 Postcholecystectomy syndrome: Secondary | ICD-10-CM

## 2012-08-15 DIAGNOSIS — Z91018 Allergy to other foods: Secondary | ICD-10-CM | POA: Insufficient documentation

## 2012-08-15 DIAGNOSIS — Z5189 Encounter for other specified aftercare: Secondary | ICD-10-CM

## 2012-08-15 DIAGNOSIS — L570 Actinic keratosis: Secondary | ICD-10-CM

## 2012-08-15 MED ORDER — COLESEVELAM HCL 625 MG PO TABS: 1250.0000 mg | ORAL_TABLET | Freq: Two times a day (BID) | ORAL | Status: DC

## 2012-08-15 NOTE — Progress Notes (Signed)
Subjective:    HPI  The patient is here for a wellness exam. The patient has been doing well overall without major physical or psychological issues going on lately. C/o occ diarrhea (following cholecystitis) fter eating eggs  Wt Readings from Last 3 Encounters:  08/15/12 195 lb (88.451 kg)  11/26/11 190 lb (86.183 kg)  12/12/10 197 lb 4 oz (89.472 kg)   BP Readings from Last 3 Encounters:  08/15/12 138/84  11/26/11 122/80  12/12/10 118/77      Review of Systems  Constitutional: Negative for appetite change, fatigue and unexpected weight change.  HENT: Negative for nosebleeds, congestion, sore throat, sneezing, trouble swallowing and neck pain.   Eyes: Negative for itching and visual disturbance.  Respiratory: Negative for cough.   Cardiovascular: Negative for chest pain, palpitations and leg swelling.  Gastrointestinal: Positive for diarrhea (after meals and gas). Negative for blood in stool and abdominal distention.  Genitourinary: Negative for frequency and hematuria.  Musculoskeletal: Negative for back pain, joint swelling and gait problem.  Skin: Negative for rash.  Neurological: Negative for dizziness, tremors, speech difficulty and weakness.  Psychiatric/Behavioral: Negative for sleep disturbance, dysphoric mood and agitation. The patient is not nervous/anxious.        Objective:   Physical Exam  Constitutional: He is oriented to person, place, and time. He appears well-developed.  HENT:  Mouth/Throat: Oropharynx is clear and moist.  Eyes: Conjunctivae are normal. Pupils are equal, round, and reactive to light.  Neck: Normal range of motion. No JVD present. No thyromegaly present.  Cardiovascular: Normal rate, regular rhythm, normal heart sounds and intact distal pulses.  Exam reveals no gallop and no friction rub.   No murmur heard. Pulmonary/Chest: Effort normal and breath sounds normal. No respiratory distress. He has no wheezes. He has no rales. He exhibits  no tenderness.  Abdominal: Soft. Bowel sounds are normal. He exhibits no distension and no mass. There is no tenderness. There is no rebound and no guarding.  Musculoskeletal: Normal range of motion. He exhibits no edema and no tenderness.  Lymphadenopathy:    He has no cervical adenopathy.  Neurological: He is alert and oriented to person, place, and time. He has normal reflexes. No cranial nerve deficit. He exhibits normal muscle tone. Coordination normal.  Skin: Skin is warm and dry. No rash noted.  Psychiatric: He has a normal mood and affect. His behavior is normal. Judgment and thought content normal.  Growths on skin AKs  Lab Results  Component Value Date   WBC 6.1 08/10/2012   HGB 14.9 08/10/2012   HCT 43.2 08/10/2012   PLT 184.0 08/10/2012   GLUCOSE 90 08/10/2012   CHOL 204* 08/10/2012   TRIG 391.0* 08/10/2012   HDL 45.80 08/10/2012   LDLDIRECT 103.7 08/10/2012   ALT 50 08/10/2012   AST 31 08/10/2012   NA 136 08/10/2012   K 3.8 08/10/2012   CL 101 08/10/2012   CREATININE 1.0 08/10/2012   BUN 14 08/10/2012   CO2 28 08/10/2012   TSH 2.54 08/10/2012   PSA 0.45 08/10/2012       Procedure Note :     Procedure : Cryosurgery   Indication:  Actinic keratosis(es)   Risks including unsuccessful procedure , bleeding, infection, bruising, scar, a need for a repeat  procedure and others were explained to the patient in detail as well as the benefits. Informed consent was obtained verbally.    2 lesion(s)  on  B cheeks  was/were treated with liquid nitrogen  on a Q-tip in a usual fasion . Band-Aid was applied and antibiotic ointment was given for a later use.   Tolerated well. Complications none.        Assessment & Plan:

## 2012-08-15 NOTE — Assessment & Plan Note (Signed)
Tree nuts, milk intolerant  Sudafed, Benadryl in a Ziplock back

## 2012-08-15 NOTE — Assessment & Plan Note (Signed)
Discussed.

## 2012-08-15 NOTE — Patient Instructions (Addendum)
Gluten free trial (no wheat products) x4-6 weeks. OK to use Gluten-free bread and pasta. Welchol 1 once or twice a day

## 2012-08-16 DIAGNOSIS — L570 Actinic keratosis: Secondary | ICD-10-CM | POA: Insufficient documentation

## 2012-08-16 DIAGNOSIS — Z0000 Encounter for general adult medical examination without abnormal findings: Secondary | ICD-10-CM | POA: Insufficient documentation

## 2012-08-16 NOTE — Assessment & Plan Note (Signed)
7/14 face AKs x 2 See procedure

## 2012-09-14 ENCOUNTER — Other Ambulatory Visit: Payer: Self-pay | Admitting: Internal Medicine

## 2012-09-19 ENCOUNTER — Ambulatory Visit (INDEPENDENT_AMBULATORY_CARE_PROVIDER_SITE_OTHER): Payer: BC Managed Care – PPO | Admitting: Internal Medicine

## 2012-09-19 ENCOUNTER — Encounter: Payer: Self-pay | Admitting: Internal Medicine

## 2012-09-19 VITALS — BP 158/90 | HR 88 | Temp 98.5°F | Wt 195.0 lb

## 2012-09-19 DIAGNOSIS — B079 Viral wart, unspecified: Secondary | ICD-10-CM

## 2012-09-19 DIAGNOSIS — D485 Neoplasm of uncertain behavior of skin: Secondary | ICD-10-CM

## 2012-09-19 MED ORDER — COLESEVELAM HCL 3.75 G PO PACK: PACK | ORAL | Status: DC

## 2012-09-19 NOTE — Patient Instructions (Addendum)
Postprocedure instructions :    A Band-Aid should be  changed twice daily. You can take a shower tomorrow.  Keep the wounds clean. You can wash them with liquid soap and water. Pat dry with gauze or a Kleenex tissue  Before applying antibiotic ointment and a Band-Aid.   You need to report immediately  if fever, chills or any signs of infection develop.    The biopsy results should be available in 1 -2 weeks. 

## 2012-09-19 NOTE — Assessment & Plan Note (Signed)
Skin bx 

## 2012-09-19 NOTE — Progress Notes (Signed)
Patient ID: Darrell Santos, male   DOB: [birth year], 47 y.o.   MRN: [id number]   Procedure Note :     Procedure :  Skin biopsy   Indication:  Changing mole (s ),  Suspicious lesion(s)   Risks including unsuccessful procedure , bleeding, infection, bruising, scar, a need for another complete procedure and others were explained to the patient in detail as well as the benefits. Informed consent was obtained and signed.   The patient was placed in a decubitus position.  Lesion #1 on the L pectoralis   measuring  4x4   mm   Skin over lesion #1  was prepped with Betadine and alcohol  and anesthetized with 1 cc of 2% lidocaine and epinephrine, using a 25-gauge 1 inch needle.  Shave biopsy with a sterile Dermablade was carried out in the usual fashion. Hyfrecator was used to destroy the rest of the lesion potentially left behind and for hemostasis. Band-Aid was applied with antibiotic ointment.    Lesion #2 on L chest wall laterally    measuring  6x3 mm   Skin over lesion #2  was prepped with Betadine and alcohol  and anesthetized with 1 cc of 2% lidocaine and epinephrine, using a 25-gauge 1 inch needle.  Shave biopsy with a sterile Dermablade was carried out in the usual fashion. Hyfrecator was used to destroy the rest of the lesion potentially left behind and for hemostasis. Band-Aid was applied with antibiotic ointment.  Lesion #3 on the L lateral chest wall    measuring  2x4 mm   Skin over lesion #3  was prepped with Betadine and alcohol  and anesthetized with 1 cc of 2% lidocaine and epinephrine, using a 25-gauge 1 inch needle.  Shave biopsy with a sterile Dermablade was carried out in the usual fashion. Hyfrecator was used to destroy the rest of the lesion potentially left behind and for hemostasis. Band-Aid was applied with antibiotic ointment.  Lesions #4-6 measuring 1x2 mm each were removed as well. #1-2 were sent to path lab.   Tolerated well. Complications none.      Postprocedure instructions :    A Band-Aid should be  changed twice daily. You can take a shower tomorrow.  Keep the wounds clean. You can wash them with liquid soap and water. Pat dry with gauze or a Kleenex tissue  Before applying antibiotic ointment and a Band-Aid.   You need to report immediately  if fever, chills or any signs of infection develop.    The biopsy results should be available in 1 -2 weeks.    Procedure Note :     Procedure : Cryosurgery   Indication:  Wart(s)     Risks including unsuccessful procedure , bleeding, infection, bruising, scar, a need for a repeat  procedure and others were explained to the patient in detail as well as the benefits. Informed consent was obtained verbally.    3 lesion(s)  on  L forearm and one on L chest  was/were treated with liquid nitrogen on a Q-tip in a usual fasion . Band-Aid was applied and antibiotic ointment was given for a later use.   Tolerated well. Complications none.   Postprocedure instructions :     Keep the wounds clean. You can wash them with liquid soap and water. Pat dry with gauze or a Kleenex tissue  Before applying antibiotic ointment and a Band-Aid.   You need to report immediately  if  any signs of infection develop.

## 2012-09-27 DIAGNOSIS — B079 Viral wart, unspecified: Secondary | ICD-10-CM | POA: Insufficient documentation

## 2012-09-27 NOTE — Assessment & Plan Note (Signed)
See procedure 

## 2012-10-23 ENCOUNTER — Telehealth: Payer: Self-pay | Admitting: *Deleted

## 2012-10-23 NOTE — Telephone Encounter (Signed)
Rf re for clonazepam 0.5 mg 1 po qhs. Ok to Rf?

## 2012-10-24 MED ORDER — CLONAZEPAM 0.5 MG PO TABS: 0.5000 mg | ORAL_TABLET | Freq: Every evening | ORAL | Status: DC | PRN

## 2012-10-24 NOTE — Telephone Encounter (Signed)
Done

## 2012-10-24 NOTE — Telephone Encounter (Signed)
OK to fill this prescription with additional refills x2 Thank you!  

## 2012-11-30 ENCOUNTER — Other Ambulatory Visit: Payer: Self-pay

## 2013-01-01 ENCOUNTER — Other Ambulatory Visit: Payer: Self-pay | Admitting: Internal Medicine

## 2013-03-05 DIAGNOSIS — M2550 Pain in unspecified joint: Secondary | ICD-10-CM | POA: Insufficient documentation

## 2013-03-05 DIAGNOSIS — M84376A Stress fracture, unspecified foot, initial encounter for fracture: Secondary | ICD-10-CM | POA: Insufficient documentation

## 2013-03-05 DIAGNOSIS — M25579 Pain in unspecified ankle and joints of unspecified foot: Secondary | ICD-10-CM | POA: Insufficient documentation

## 2013-03-19 ENCOUNTER — Other Ambulatory Visit: Payer: Self-pay | Admitting: Internal Medicine

## 2013-06-04 ENCOUNTER — Telehealth: Payer: Self-pay

## 2013-06-04 NOTE — Telephone Encounter (Signed)
Patient called lmovm requesting status of PA on Diovan, states he couple a few weeks ago and have not heard anything. Thanks

## 2013-06-07 ENCOUNTER — Telehealth: Payer: Self-pay | Admitting: Internal Medicine

## 2013-06-07 NOTE — Telephone Encounter (Signed)
PA for Diovan approved. Rx mail order has accepted the refill and pt has been informed.

## 2013-06-07 NOTE — Telephone Encounter (Signed)
DIOVAN PA = This has been completed and approved. LVMM for pt to call back so I can inform.

## 2013-07-06 ENCOUNTER — Other Ambulatory Visit: Payer: Self-pay | Admitting: Internal Medicine

## 2013-08-23 ENCOUNTER — Other Ambulatory Visit: Payer: Self-pay | Admitting: Internal Medicine

## 2013-09-10 DIAGNOSIS — Z202 Contact with and (suspected) exposure to infections with a predominantly sexual mode of transmission: Secondary | ICD-10-CM | POA: Insufficient documentation

## 2013-09-19 ENCOUNTER — Telehealth: Payer: Self-pay | Admitting: *Deleted

## 2013-09-19 MED ORDER — CLONAZEPAM 0.5 MG PO TABS: 0.5000 mg | ORAL_TABLET | Freq: Every evening | ORAL | Status: DC | PRN

## 2013-09-19 NOTE — Telephone Encounter (Signed)
Done

## 2013-09-19 NOTE — Telephone Encounter (Signed)
Rf req for Clonazepam 0.5 mg 1 po qhs. Last filled 02/19/13 ? Ok to rf?

## 2013-09-19 NOTE — Telephone Encounter (Signed)
OK to fill this prescription with additional refills x1 Thank you!  

## 2013-10-08 ENCOUNTER — Other Ambulatory Visit: Payer: Self-pay | Admitting: Internal Medicine

## 2013-11-25 ENCOUNTER — Emergency Department
Admission: EM | Admit: 2013-11-25 | Discharge: 2013-11-25 | Disposition: A | Discharge status: Home / Self Care | Payer: BC Managed Care – PPO | Source: Home / Self Care | Attending: Emergency Medicine | Admitting: Emergency Medicine

## 2013-11-25 ENCOUNTER — Encounter: Payer: Self-pay | Admitting: *Deleted

## 2013-11-25 DIAGNOSIS — J209 Acute bronchitis, unspecified: Secondary | ICD-10-CM

## 2013-11-25 DIAGNOSIS — J0100 Acute maxillary sinusitis, unspecified: Secondary | ICD-10-CM

## 2013-11-25 MED ORDER — PREDNISONE (PAK) 10 MG PO TABS: ORAL_TABLET | ORAL | Status: DC

## 2013-11-25 MED ORDER — ALBUTEROL SULFATE HFA 108 (90 BASE) MCG/ACT IN AERS: 2.0000 | INHALATION_SPRAY | RESPIRATORY_TRACT | Status: DC | PRN

## 2013-11-25 MED ORDER — FLUTICASONE PROPIONATE 50 MCG/ACT NA SUSP: NASAL | Status: DC

## 2013-11-25 MED ORDER — AMOXICILLIN 250 MG PO CAPS: 500.0000 mg | ORAL_CAPSULE | Freq: Three times a day (TID) | ORAL | Status: AC

## 2013-11-25 NOTE — ED Provider Notes (Signed)
CSN: [id number]     Arrival date & time 11/25/13  1404 History   First MD Initiated Contact with Patient 11/25/13 1434     Chief Complaint  Patient presents with  . Sinusitis   (Consider location/radiation/quality/duration/timing/severity/associated sxs/prior Treatment) HPI URI HISTORY  Kalonji is a 48 y.o. male who complains of onset of sinus and chest congestion with progressive symptoms and wheezing, drainage for 2 1/2 weeks. Using Qvar and pro-air as prescribed by his PCP, and that's not helping significantly.  Have been using over-the-counter treatment which helps a little bit.  No chills/sweats +  Low gradeFever  +  Nasal congestion +  Discolored Post-nasal drainage No sinus pain/pressure No sore throat  +  cough positive wheezing positive chest congestion No hemoptysis No shortness of breath No pleuritic pain  No itchy/red eyes No earache  No nausea No vomiting No abdominal pain No diarrhea  No skin rashes +  Fatigue No myalgias No headache   Past Medical History  Diagnosis Date  . Anxiety   . GERD (gastroesophageal reflux disease)   . HTN (hypertension)   . Rosacea   . Allergic rhinitis   . Asthma    Past Surgical History  Procedure Laterality Date  . Nasal sinus surgery    . Cholecystectomy     Family History  Problem Relation Age of Onset  . Hypertension Other   . Diabetes Other     1st degree relative   . Hypertension Other    History  Substance Use Topics  . Smoking status: Never Smoker   . Smokeless tobacco: Not on file  . Alcohol Use: 7.0 oz/week    14 drink(s) per week     Comment: 2/day    Review of Systems  All other systems reviewed and are negative.   Allergies  Other and Pollen extract  Home Medications   Prior to Admission medications   Medication Sig Start Date End Date Taking? Authorizing Provider  albuterol (PROAIR HFA) 108 (90 BASE) MCG/ACT inhaler Inhale 2 puffs into the lungs every 4 (four) hours as needed for  wheezing or shortness of breath. 11/25/13   Jacqulyn Cane, MD  amoxicillin (AMOXIL) 250 MG capsule Take 2 capsules (500 mg total) by mouth 3 (three) times daily. For 10 days 11/25/13 12/05/13  Jacqulyn Cane, MD  azelastine (OPTIVAR) 0.05 % ophthalmic solution Place 1 drop into both eyes 2 (two) times daily. 11/26/11   Aleksei Plotnikov V, MD  clonazePAM (KLONOPIN) 0.5 MG tablet Take 1 tablet (0.5 mg total) by mouth at bedtime as needed. 09/19/13   Aleksei Plotnikov V, MD  Colesevelam HCl [facility]) 3.75 G PACK 1 pack qd 09/19/12   Aleksei Plotnikov V, MD  DIOVAN HCT 160-12.5 MG per tablet TAKE 1 TABLET DAILY    Aleksei Plotnikov V, MD  escitalopram (LEXAPRO) 10 MG tablet TAKE 1 TABLET DAILY (NEEDS OFFICE VISIT FOR FUTURE REFILLS) 07/06/13   Cassandria Anger, MD  esomeprazole (NEXIUM) 40 MG capsule TAKE 1 CAPSULE DAILY BEFORE BREAKFAST 10/08/13   Aleksei Plotnikov V, MD  fluticasone (FLONASE) 50 MCG/ACT nasal spray Place 2 sprays into the nose daily. 11/26/11 11/25/12  Aleksei Plotnikov V, MD  fluticasone (FLONASE) 50 MCG/ACT nasal spray USE 2 SPRAYS NASALLY DAILY 03/01/12   Cassandria Anger, MD  fluticasone Asencion [facility]) 50 MCG/ACT nasal spray 1 or 2 sprays each nostril twice a day 11/25/13   Jacqulyn Cane, MD  predniSONE (STERAPRED UNI-PAK) 10 MG tablet Take as directed for 6 days.--Take 6  on day 1, 5 on day 2, 4 on day 3, then 3 tablets on day 4, then 2 tablets on day 5, then 1 on day 6. 11/25/13   Jacqulyn Cane, MD  QVAR 80 MCG/ACT inhaler INHALE 1 PUFF INTO THE LUNGS TWICE A DAY    Aleksei Plotnikov V, MD  valsartan-hydrochlorothiazide (DIOVAN HCT) 160-12.5 MG per tablet Take 1 tablet by mouth daily. BRAND NAME ONLY 05/01/12   Aleksei Plotnikov V, MD   BP 130/82 mmHg  Pulse 90  Temp(Src) 98.1 F (36.7 C) (Oral)  Ht 6' (1.829 m)  Wt 198 lb (89.812 kg)  BMI 26.85 kg/m2  SpO2 97% Physical Exam  Constitutional: He is oriented to person, place, and time. He appears well-developed and well-nourished. No  distress.  HENT:  Head: Normocephalic and atraumatic.  Right Ear: Tympanic membrane, external ear and ear canal normal.  Left Ear: Tympanic membrane, external ear and ear canal normal.  Nose: Mucosal edema and rhinorrhea present. Right sinus exhibits maxillary sinus tenderness. Left sinus exhibits maxillary sinus tenderness.  Mouth/Throat: Oropharynx is clear and moist. No oral lesions. No oropharyngeal exudate.  Eyes: Right eye exhibits no discharge. Left eye exhibits no discharge. No scleral icterus.  Neck: Neck supple.  Cardiovascular: Normal rate, regular rhythm and normal heart sounds.   Pulmonary/Chest: Effort normal. He has wheezes. He has rhonchi. He has no rales.  Lymphadenopathy:    He has no cervical adenopathy.  Neurological: He is alert and oriented to person, place, and time.  Skin: Skin is warm and dry.  Nursing note and vitals reviewed.   ED Course  Procedures (including critical care time) Labs Review Labs Reviewed - No data to display  Imaging Review No results found.   MDM   1. Acute bronchitis with bronchospasm   2. Acute maxillary sinusitis, recurrence not specified    Treatment options discussed, as well as risks, benefits, alternatives. Patient voiced understanding and agreement with the following plans:  Amoxicillin 500 3 times a day 10 days. He prefers the 250 mg, so advised to take 2  ,3 times a day. Flonase Prednisone 10 mg-6 day Dosepak Refilled albuterol  HFA, for rescue inhaler when necessary. Any further refills of this to be done by PCP. He declined chest x-ray or IM medication today. Follow-up with your primary care doctor in 5-7 days if not improving, or sooner if symptoms become worse. Precautions discussed. Red flags discussed. Questions invited and answered. Patient voiced understanding and agreement.    Jacqulyn Cane, MD 11/27/13 [phone]

## 2013-11-25 NOTE — ED Notes (Signed)
Pt complains of sore throat, nasal drainage, wheezing, sinus pressure and headache for 2 1/2 years.  5/10 pain

## 2013-12-10 ENCOUNTER — Other Ambulatory Visit: Payer: Self-pay | Admitting: Internal Medicine

## 2013-12-24 ENCOUNTER — Other Ambulatory Visit: Payer: Self-pay | Admitting: Internal Medicine

## 2014-01-29 ENCOUNTER — Telehealth: Payer: Self-pay | Admitting: Internal Medicine

## 2014-01-29 NOTE — Telephone Encounter (Signed)
Pt states [facility] will not fill Lexapro and was told to call MD, he is wondering if there is a problem or will it be filled?  [phone]

## 2014-01-30 ENCOUNTER — Telehealth: Payer: Self-pay | Admitting: Internal Medicine

## 2014-01-30 ENCOUNTER — Other Ambulatory Visit: Payer: Self-pay | Admitting: Internal Medicine

## 2014-01-30 DIAGNOSIS — M76899 Other specified enthesopathies of unspecified lower limb, excluding foot: Secondary | ICD-10-CM | POA: Insufficient documentation

## 2014-01-30 MED ORDER — ESCITALOPRAM OXALATE 10 MG PO TABS: 10.0000 mg | ORAL_TABLET | Freq: Every day | ORAL | Status: DC

## 2014-01-30 NOTE — Telephone Encounter (Signed)
Pt is overdue for appt last saw md August 2014, need appt before refilling...Darrell Santos

## 2014-01-30 NOTE — Telephone Encounter (Signed)
We can only send a 30 day to local pharmacy until pt see md. Sent 30 day to [facility], and once he is seen we can [facility]!...Johny Chess

## 2014-01-30 NOTE — Telephone Encounter (Signed)
Pt will be out of Lexapro within a few days, pt has not been seen since 7/15 however pt did set up med f/u appt for 02/05/14. He is requesting small refill sent to [facility].

## 2014-02-05 ENCOUNTER — Encounter: Payer: Self-pay | Admitting: Internal Medicine

## 2014-02-05 ENCOUNTER — Ambulatory Visit (INDEPENDENT_AMBULATORY_CARE_PROVIDER_SITE_OTHER): Payer: BLUE CROSS/BLUE SHIELD | Admitting: Internal Medicine

## 2014-02-05 VITALS — BP 120/88 | HR 92 | Temp 98.1°F | Wt 208.0 lb

## 2014-02-05 DIAGNOSIS — Z91018 Allergy to other foods: Secondary | ICD-10-CM

## 2014-02-05 DIAGNOSIS — F411 Generalized anxiety disorder: Secondary | ICD-10-CM

## 2014-02-05 DIAGNOSIS — I10 Essential (primary) hypertension: Secondary | ICD-10-CM

## 2014-02-05 DIAGNOSIS — J4520 Mild intermittent asthma, uncomplicated: Secondary | ICD-10-CM

## 2014-02-05 MED ORDER — ESCITALOPRAM OXALATE 10 MG PO TABS: 10.0000 mg | ORAL_TABLET | Freq: Every day | ORAL | Status: DC

## 2014-02-05 MED ORDER — CLONAZEPAM 0.5 MG PO TABS: 0.5000 mg | ORAL_TABLET | Freq: Every evening | ORAL | Status: DC | PRN

## 2014-02-05 NOTE — Assessment & Plan Note (Signed)
Avoidance Meds discussed

## 2014-02-05 NOTE — Assessment & Plan Note (Signed)
Public speaking anxiety

## 2014-02-05 NOTE — Progress Notes (Signed)
   Subjective:    HPI  C/o public anxiety - better on Lexapro. The patient has been doing well overall without major physical or psychological issues going on lately. C/o occ diarrhea (following cholecystitis) fter eating eggs  Wt Readings from Last 3 Encounters:  02/05/14 208 lb (94.348 kg)  11/25/13 198 lb (89.812 kg)  09/19/12 195 lb (88.451 kg)   BP Readings from Last 3 Encounters:  02/05/14 120/88  11/25/13 130/82  09/19/12 158/90      Review of Systems  Constitutional: Negative for appetite change, fatigue and unexpected weight change.  HENT: Negative for congestion, nosebleeds, sneezing, sore throat and trouble swallowing.   Eyes: Negative for itching and visual disturbance.  Respiratory: Negative for cough.   Cardiovascular: Negative for chest pain, palpitations and leg swelling.  Gastrointestinal: Positive for diarrhea (after meals and gas). Negative for blood in stool and abdominal distention.  Genitourinary: Negative for frequency and hematuria.  Musculoskeletal: Negative for back pain, joint swelling, gait problem and neck pain.  Skin: Negative for rash.  Neurological: Negative for dizziness, tremors, speech difficulty and weakness.  Psychiatric/Behavioral: Negative for sleep disturbance, dysphoric mood and agitation. The patient is not nervous/anxious.        Objective:   Physical Exam  Constitutional: He is oriented to person, place, and time. He appears well-developed. No distress.  NAD  HENT:  Mouth/Throat: Oropharynx is clear and moist.  Eyes: Conjunctivae are normal. Pupils are equal, round, and reactive to light.  Neck: Normal range of motion. No JVD present. No thyromegaly present.  Cardiovascular: Normal rate, regular rhythm, normal heart sounds and intact distal pulses.  Exam reveals no gallop and no friction rub.   No murmur heard. Pulmonary/Chest: Effort normal and breath sounds normal. No respiratory distress. He has no wheezes. He has no rales.  He exhibits no tenderness.  Abdominal: Soft. Bowel sounds are normal. He exhibits no distension and no mass. There is no tenderness. There is no rebound and no guarding.  Musculoskeletal: Normal range of motion. He exhibits no edema or tenderness.  Lymphadenopathy:    He has no cervical adenopathy.  Neurological: He is alert and oriented to person, place, and time. He has normal reflexes. No cranial nerve deficit. He exhibits normal muscle tone. He displays a negative Romberg sign. Coordination and gait normal.  No meningeal signs  Skin: Skin is warm and dry. No rash noted.  Psychiatric: He has a normal mood and affect. His behavior is normal. Judgment and thought content normal.    Lab Results  Component Value Date   WBC 6.1 08/10/2012   HGB 14.9 08/10/2012   HCT 43.2 08/10/2012   PLT 184.0 08/10/2012   GLUCOSE 90 08/10/2012   CHOL 204* 08/10/2012   TRIG 391.0* 08/10/2012   HDL 45.80 08/10/2012   LDLDIRECT 103.7 08/10/2012   ALT 50 08/10/2012   AST 31 08/10/2012   NA 136 08/10/2012   K 3.8 08/10/2012   CL 101 08/10/2012   CREATININE 1.0 08/10/2012   BUN 14 08/10/2012   CO2 28 08/10/2012   TSH 2.54 08/10/2012   PSA 0.45 08/10/2012            Assessment & Plan:

## 2014-02-05 NOTE — Assessment & Plan Note (Signed)
No relapse 

## 2014-02-05 NOTE — Progress Notes (Signed)
Pre visit review using our clinic review tool, if applicable. No additional management support is needed unless otherwise documented below in the visit note. 

## 2014-02-05 NOTE — Assessment & Plan Note (Signed)
Continue with current prescription therapy as reflected on the Med list.  

## 2014-02-11 ENCOUNTER — Encounter: Payer: Self-pay | Admitting: *Deleted

## 2014-02-11 ENCOUNTER — Emergency Department
Admission: EM | Admit: 2014-02-11 | Discharge: 2014-02-11 | Disposition: A | Discharge status: Home / Self Care | Payer: BLUE CROSS/BLUE SHIELD | Source: Home / Self Care | Attending: Family Medicine | Admitting: Family Medicine

## 2014-02-11 DIAGNOSIS — N410 Acute prostatitis: Secondary | ICD-10-CM

## 2014-02-11 LAB — POCT URINALYSIS DIP (MANUAL ENTRY)
BILIRUBIN UA: NEGATIVE
Bilirubin, UA: NEGATIVE
Bilirubin, UA: NEGATIVE
Glucose, UA: NEGATIVE
Glucose, UA: NEGATIVE
Ketones, POC UA: NEGATIVE
Leukocytes, UA: NEGATIVE
Leukocytes, UA: NEGATIVE
Nitrite, UA: NEGATIVE
Nitrite, UA: NEGATIVE
PROTEIN UA: NEGATIVE
Protein Ur, POC: NEGATIVE
SPEC GRAV UA: 1.025 (ref 1.005–1.03)
Spec Grav, UA: >=1.03 (ref 1.005–1.03)
UROBILINOGEN UA: 0.2 (ref 0–1)
Urobilinogen, UA: 0.2 (ref 0–1)
pH, UA: 5 (ref 5–8)
pH, UA: 5.5 (ref 5–8)

## 2014-02-11 MED ORDER — SULFAMETHOXAZOLE-TRIMETHOPRIM 200-40 MG/5ML PO SUSP: 20.0000 mL | Freq: Two times a day (BID) | ORAL | Status: DC

## 2014-02-11 NOTE — ED Notes (Signed)
Pt c/o hematuria yesterday x 3, none today. Denies any dysuria, flank pain or abd pain. Denies fever. Denies any hx of kidney stones. Denies any STD exposure.

## 2014-02-11 NOTE — Discharge Instructions (Signed)
Increase fluid intake.   Prostatitis The prostate gland is about the size and shape of a walnut. It is located just below your bladder. It produces one of the components of semen, which is made up of sperm and the fluids that help nourish and transport it out from the testicles. Prostatitis is inflammation of the prostate gland.  There are four types of prostatitis:  Acute bacterial prostatitis. This is the least common type of prostatitis. It starts quickly and usually is associated with a bladder infection, high fever, and shaking chills. It can occur at any age.  Chronic bacterial prostatitis. This is a persistent bacterial infection in the prostate. It usually develops from repeated acute bacterial prostatitis or acute bacterial prostatitis that was not properly treated. It can occur in men of any age but is most common in middle-aged men whose prostate has begun to enlarge. The symptoms are not as severe as those in acute bacterial prostatitis. Discomfort in the part of your body that is in front of your rectum and below your scrotum (perineum), lower abdomen, or in the head of your penis (glans) may represent your primary discomfort.  Chronic prostatitis (nonbacterial). This is the most common type of prostatitis. It is inflammation of the prostate gland that is not caused by a bacterial infection. The cause is unknown and may be associated with a viral infection or autoimmune disorder.  Prostatodynia (pelvic floor disorder). This is associated with increased muscular tone in the pelvis surrounding the prostate. CAUSES The causes of bacterial prostatitis are bacterial infection. The causes of the other types of prostatitis are unknown.  SYMPTOMS  Symptoms can vary depending upon the type of prostatitis that exists. There can also be overlap in symptoms. Possible symptoms for each type of prostatitis are listed below. Acute Bacterial Prostatitis  Painful urination.  Fever or  chills.  Muscle or joint pains.  Low back pain.  Low abdominal pain.  Inability to empty bladder completely. Chronic Bacterial Prostatitis, Chronic Nonbacterial Prostatitis, and Prostatodynia  Sudden urge to urinate.  Frequent urination.  Difficulty starting urine stream.  Weak urine stream.  Discharge from the urethra.  Dribbling after urination.  Rectal pain.  Pain in the testicles, penis, or tip of the penis.  Pain in the perineum.  Problems with sexual function.  Painful ejaculation.  Bloody semen. DIAGNOSIS  In order to diagnose prostatitis, your health care provider will ask about your symptoms. One or more urine samples will be taken and tested (urinalysis). If the urinalysis result is negative for bacteria, your health care provider may use a finger to feel your prostate (digital rectal exam). This exam helps your health care provider determine if your prostate is swollen and tender. It will also produce a specimen of semen that can be analyzed. TREATMENT  Treatment for prostatitis depends on the cause. If a bacterial infection is the cause, it can be treated with antibiotic medicine. In cases of chronic bacterial prostatitis, the use of antibiotics for up to 1 month or 6 weeks may be necessary. Your health care provider may instruct you to take sitz baths to help relieve pain. A sitz bath is a bath of hot water in which your hips and buttocks are under water. This relaxes the pelvic floor muscles and often helps to relieve the pressure on your prostate. HOME CARE INSTRUCTIONS   Take all medicines as directed by your health care provider.  Take sitz baths as directed by your health care provider. [place]  IF:   Your symptoms get worse, not better.  You have a fever. SEEK IMMEDIATE MEDICAL CARE IF:   You have chills.  You feel nauseous or vomit.  You feel lightheaded or faint.  You are unable to urinate.  You have blood or blood clots in your  urine. MAKE SURE YOU:  Understand these instructions.  Will watch your condition.  Will get help right away if you are not doing well or get worse. Document Released: 01/09/2000 Document Revised: 01/16/2013 Document Reviewed: 07/31/2012 [facility] Patient Information 2015 [place], [place]. This information is not intended to replace advice given to you by your health care provider. Make sure you discuss any questions you have with your health care provider.

## 2014-02-11 NOTE — ED Provider Notes (Signed)
CSN: [id number]     Arrival date & time 02/11/14  1627 History   First MD Initiated Contact with Patient 02/11/14 1649     Chief Complaint  Patient presents with  . Hematuria      HPI Comments: Patient noted blood in his urine yesterday morning without dysuria, frequency, urgency, flank pain, or fever.  The hematuria gradually resolved during the day.  He noted mild right lower back ache yesterday that resolved.  No STD exposure.  No testicular pain.  No fevers, chills, and sweats.  Patient is a 49 y.o. male presenting with hematuria. The history is provided by the patient.  Hematuria This is a new problem. The current episode started yesterday. The problem occurs hourly. The problem has been resolved. Pertinent negatives include no abdominal pain. Associated symptoms comments: Mild right lower back ache yesterday. Nothing aggravates the symptoms. Nothing relieves the symptoms. He has tried nothing for the symptoms.    Past Medical History  Diagnosis Date  . Anxiety   . GERD (gastroesophageal reflux disease)   . HTN (hypertension)   . Rosacea   . Allergic rhinitis   . Asthma    Past Surgical History  Procedure Laterality Date  . Nasal sinus surgery    . Cholecystectomy     Family History  Problem Relation Age of Onset  . Hypertension Other   . Diabetes Other     1st degree relative   . Hypertension Other    History  Substance Use Topics  . Smoking status: Never Smoker   . Smokeless tobacco: Not on file  . Alcohol Use: 7.0 oz/week    14 drink(s) per week     Comment: 2/day    Review of Systems  Constitutional: Negative for fever, chills and fatigue.  Gastrointestinal: Negative for abdominal pain.  Genitourinary: Positive for hematuria. Negative for dysuria, urgency, frequency, flank pain, decreased urine volume, discharge, scrotal swelling, difficulty urinating, genital sores, penile pain and testicular pain.    Allergies  Other and Pollen extract  Home Medications    Prior to Admission medications   Medication Sig Start Date End Date Taking? Authorizing Provider  albuterol (PROAIR HFA) 108 (90 BASE) MCG/ACT inhaler Inhale 2 puffs into the lungs every 4 (four) hours as needed for wheezing or shortness of breath. 11/25/13   Jacqulyn Cane, MD  azelastine (OPTIVAR) 0.05 % ophthalmic solution Place 1 drop into both eyes 2 (two) times daily. 11/26/11   Aleksei Plotnikov V, MD  clonazePAM (KLONOPIN) 0.5 MG tablet Take 1 tablet (0.5 mg total) by mouth at bedtime as needed. 02/05/14   Aleksei Plotnikov V, MD  Colesevelam HCl [facility]) 3.75 G PACK 1 pack qd 09/19/12   Aleksei Plotnikov V, MD  DIOVAN HCT 160-12.5 MG per tablet TAKE 1 TABLET DAILY 12/25/13   Aleksei Plotnikov V, MD  escitalopram (LEXAPRO) 10 MG tablet Take 1 tablet (10 mg total) by mouth daily. Must keep yearly appt for additional refills 02/05/14   Aleksei Plotnikov V, MD  esomeprazole (NEXIUM) 40 MG capsule TAKE 1 CAPSULE DAILY BEFORE BREAKFAST. OVERDUE FOR YEARLY PHYSICAL MUST SEE MD FOR FUTURE REFILLS. 12/25/13   Aleksei Plotnikov V, MD  fluticasone (FLONASE) 50 MCG/ACT nasal spray Place 2 sprays into the nose daily. 11/26/11 11/25/12  Aleksei Plotnikov V, MD  fluticasone (FLONASE) 50 MCG/ACT nasal spray USE 2 SPRAYS NASALLY DAILY 03/01/12   Aleksei Plotnikov V, MD  fluticasone (FLONASE) 50 MCG/ACT nasal spray 1 or 2 sprays each nostril twice a day  11/25/13   Jacqulyn Cane, MD  predniSONE (STERAPRED UNI-PAK) 10 MG tablet Take as directed for 6 days.--Take 6 on day 1, 5 on day 2, 4 on day 3, then 3 tablets on day 4, then 2 tablets on day 5, then 1 on day 6. 11/25/13   Jacqulyn Cane, MD  QVAR 80 MCG/ACT inhaler INHALE 1 PUFF INTO THE LUNGS TWICE A DAY    Aleksei Plotnikov V, MD  sulfamethoxazole-trimethoprim (BACTRIM,SEPTRA) 200-40 MG/5ML suspension Take 20 mLs by mouth 2 (two) times daily. 02/11/14   Kandra Nicolas, MD  valsartan-hydrochlorothiazide (DIOVAN HCT) 160-12.5 MG per tablet Take 1 tablet by mouth daily.  BRAND NAME ONLY 05/01/12   Aleksei Plotnikov V, MD   BP 136/80 mmHg  Pulse 92  Temp(Src) 98.1 F (36.7 C) (Oral)  Resp 16  Ht 6' (1.829 m)  Wt 204 lb (92.534 kg)  BMI 27.66 kg/m2  SpO2 99% Physical Exam Nursing notes and Vital Signs reviewed. Appearance:  Patient appears stated age, and in no acute distress Eyes:  Pupils are equal, round, and reactive to light and accomodation.  Extraocular movement is intact.  Conjunctivae are not inflamed  Ears:  Canals normal.  Tympanic membranes normal.  Nose:   Normal turbinates.  No sinus tenderness.   Pharynx:  Normal Neck:  Supple.  No adenopathy Lungs:  Clear to auscultation.  Breath sounds are equal.  Heart:  Regular rate and rhythm without murmurs, rubs, or gallops.  Abdomen:  Nontender without masses or hepatosplenomegaly.  Bowel sounds are present.  No CVA or flank tenderness.  Extremities:  No edema.  No calf tenderness Skin:  No rash present. Rectal Exam:  Anus is normal without surrounding erythema. No external hemorrhoids are present.  No lesions are palpated in the rectal vault.  Able to reach only the posterior edge of prostate which is somewhat "boggy" and tender to palpation   ED Course  Procedures  none    Labs Reviewed  URINE CULTURE  POCT URINALYSIS DIP (MANUAL ENTRY) Initial specimen: BLO trace intact; otherwise negative  POCT URINALYSIS DIP (MANUAL ENTRY) Second specimen (after prostate exam):  SG >= 1.030; BLO small; otherwise negative         MDM   1. Acute prostatitis    Urine culture pending Begin trimethoprim/sulfamethoxazole suspension BID for 10 days Increase fluid intake. Followup with Family Doctor in 10 days.    Kandra Nicolas, MD 02/13/14 [phone]

## 2014-02-12 LAB — URINE CULTURE
Colony Count: NO GROWTH
Organism ID, Bacteria: NO GROWTH

## 2014-02-14 ENCOUNTER — Telehealth: Payer: Self-pay | Admitting: Emergency Medicine

## 2014-02-22 ENCOUNTER — Encounter: Payer: Self-pay | Admitting: Internal Medicine

## 2014-02-22 ENCOUNTER — Other Ambulatory Visit: Payer: Self-pay | Admitting: Internal Medicine

## 2014-02-22 DIAGNOSIS — R319 Hematuria, unspecified: Secondary | ICD-10-CM

## 2014-03-12 MED ORDER — COLESEVELAM HCL 3.75 G PO PACK: PACK | ORAL | Status: DC

## 2014-03-25 ENCOUNTER — Other Ambulatory Visit: Payer: Self-pay | Admitting: Internal Medicine

## 2014-04-02 DIAGNOSIS — T50905A Adverse effect of unspecified drugs, medicaments and biological substances, initial encounter: Secondary | ICD-10-CM | POA: Insufficient documentation

## 2014-04-02 DIAGNOSIS — L299 Pruritus, unspecified: Secondary | ICD-10-CM | POA: Insufficient documentation

## 2014-06-03 DIAGNOSIS — L820 Inflamed seborrheic keratosis: Secondary | ICD-10-CM | POA: Insufficient documentation

## 2014-07-30 ENCOUNTER — Encounter: Payer: Self-pay | Admitting: Internal Medicine

## 2014-08-05 ENCOUNTER — Other Ambulatory Visit: Payer: Self-pay | Admitting: Internal Medicine

## 2014-08-05 DIAGNOSIS — M87051 Idiopathic aseptic necrosis of right femur: Secondary | ICD-10-CM | POA: Insufficient documentation

## 2014-08-06 ENCOUNTER — Ambulatory Visit: Payer: BLUE CROSS/BLUE SHIELD | Admitting: Internal Medicine

## 2014-08-19 ENCOUNTER — Telehealth: Payer: Self-pay

## 2014-08-19 MED ORDER — PANTOPRAZOLE SODIUM 40 MG PO TBEC: 40.0000 mg | DELAYED_RELEASE_TABLET | Freq: Every day | ORAL | Status: DC

## 2014-08-19 NOTE — Telephone Encounter (Signed)
OK Pantoprazole. Done Thx

## 2014-08-19 NOTE — Telephone Encounter (Signed)
Request for alternative to Nexium.   Esomeprazole Lansoprazole Omeprazole Pantoprazole  Pls give strength if one of the above alternatives is approved.    [facility] fax number [phone]

## 2014-08-21 ENCOUNTER — Telehealth: Payer: Self-pay | Admitting: Internal Medicine

## 2014-08-21 DIAGNOSIS — Z96642 Presence of left artificial hip joint: Secondary | ICD-10-CM | POA: Insufficient documentation

## 2014-08-21 NOTE — Telephone Encounter (Signed)
Ref # [id number]  [facility] called and needing verification for prescription esomeprazole ([place]) 40 MG capsule [[id number]] DISCONTINUED  Call back number is [phone]

## 2014-08-22 NOTE — Telephone Encounter (Signed)
Pt informed of below.  

## 2014-08-22 NOTE — Telephone Encounter (Signed)
Pantoprazole was emailed on 7/25 OK to use 2 otc neium while waiting Thx

## 2014-08-22 NOTE — Telephone Encounter (Signed)
I called [facility]. Per pharmacist pt only wants Brand Name Nexium. Per his Insurance the plan will not cover Brand Nexium unless he has tried other alternatives. MD changed to Pantoprazole. See meds. Pt is aware of this change and why. He is willing to try pantoprazole. However, he wants to know if taking the OTC Nexium 2 qd will be sufficient. Please advise

## 2014-09-19 ENCOUNTER — Ambulatory Visit (INDEPENDENT_AMBULATORY_CARE_PROVIDER_SITE_OTHER): Payer: BLUE CROSS/BLUE SHIELD | Admitting: Internal Medicine

## 2014-09-19 ENCOUNTER — Encounter: Payer: Self-pay | Admitting: Internal Medicine

## 2014-09-19 VITALS — BP 120/90 | HR 92 | Wt 202.0 lb

## 2014-09-19 DIAGNOSIS — G2581 Restless legs syndrome: Secondary | ICD-10-CM | POA: Diagnosis not present

## 2014-09-19 DIAGNOSIS — R21 Rash and other nonspecific skin eruption: Secondary | ICD-10-CM

## 2014-09-19 MED ORDER — VALSARTAN 160 MG PO TABS: 160.0000 mg | ORAL_TABLET | Freq: Every day | ORAL | Status: DC

## 2014-09-19 MED ORDER — CLONAZEPAM 0.5 MG PO TABS
0.5000 mg | ORAL_TABLET | Freq: Every evening | ORAL | Status: DC | PRN
Start: 2014-09-19 — End: 2015-11-21

## 2014-09-19 MED ORDER — PRAMIPEXOLE DIHYDROCHLORIDE 0.25 MG PO TABS: 0.2500 mg | ORAL_TABLET | Freq: Every day | ORAL | Status: DC

## 2014-09-19 NOTE — Progress Notes (Signed)
Subjective:  Patient ID: Darrell Santos, male    DOB: 1965/09/11  Age: 49 y.o. MRN: [id number]  CC: Insomnia   HPI Darrell Santos presents for insomnia, restless legs after L THR in Jul 2016 for avascular necrosis. 4-5 h of sleep. Tried Melatonin. C/o trunk and UE rash x 6 months  Outpatient Prescriptions Prior to Visit  Medication Sig Dispense Refill  . albuterol (PROAIR HFA) 108 (90 BASE) MCG/ACT inhaler Inhale 2 puffs into the lungs every 4 (four) hours as needed for wheezing or shortness of breath. 1 Inhaler 0  . azelastine (OPTIVAR) 0.05 % ophthalmic solution Place 1 drop into both eyes 2 (two) times daily. 6 mL 12  . clonazePAM (KLONOPIN) 0.5 MG tablet Take 1 tablet (0.5 mg total) by mouth at bedtime as needed. 90 tablet 1  . Colesevelam HCl (WELCHOL) 3.75 G PACK 1 pack qd 90 each 3  . DIOVAN HCT 160-12.5 MG per tablet TAKE 1 TABLET DAILY 90 tablet 3  . escitalopram (LEXAPRO) 10 MG tablet TAKE 1 TABLET DAILY. MUST KEEP YEARLY APPOINTMENT FOR ADDITIONAL REFILLS 90 tablet 3  . fluticasone (FLONASE) 50 MCG/ACT nasal spray 1 or 2 sprays each nostril twice a day 16 g 0  . pantoprazole (PROTONIX) 40 MG tablet Take 1 tablet (40 mg total) by mouth daily. 90 tablet 3  . QVAR 80 MCG/ACT inhaler INHALE 1 PUFF INTO THE LUNGS TWICE A DAY 3 g 2  . valsartan-hydrochlorothiazide (DIOVAN HCT) 160-12.5 MG per tablet Take 1 tablet by mouth daily. BRAND NAME ONLY 7 tablet 0  . fluticasone (FLONASE) 50 MCG/ACT nasal spray USE 2 SPRAYS NASALLY DAILY 16 g 3  . predniSONE (STERAPRED UNI-PAK) 10 MG tablet Take as directed for 6 days.--Take 6 on day 1, 5 on day 2, 4 on day 3, then 3 tablets on day 4, then 2 tablets on day 5, then 1 on day 6. 21 tablet 0  . sulfamethoxazole-trimethoprim (BACTRIM,SEPTRA) 200-40 MG/5ML suspension Take 20 mLs by mouth 2 (two) times daily. 400 mL 0  . fluticasone (FLONASE) 50 MCG/ACT nasal spray Place 2 sprays into the nose daily. 48 g 3   No facility-administered  medications prior to visit.    ROS Review of Systems  Constitutional: Negative for appetite change, fatigue and unexpected weight change.  HENT: Negative for congestion, nosebleeds, sneezing, sore throat and trouble swallowing.   Eyes: Negative for itching and visual disturbance.  Respiratory: Negative for cough.   Cardiovascular: Negative for chest pain, palpitations and leg swelling.  Gastrointestinal: Negative for nausea, diarrhea, blood in stool and abdominal distention.  Genitourinary: Negative for frequency and hematuria.  Musculoskeletal: Positive for gait problem. Negative for back pain, joint swelling and neck pain.  Skin: Positive for rash.  Neurological: Negative for dizziness, tremors, speech difficulty and weakness.  Psychiatric/Behavioral: Negative for sleep disturbance, dysphoric mood and agitation. The patient is not nervous/anxious.     Objective:  BP 120/90 mmHg  Pulse 92  Wt 202 lb (91.627 kg)  SpO2 96%  BP Readings from Last 3 Encounters:  09/19/14 120/90  02/11/14 136/80  02/05/14 120/88    Wt Readings from Last 3 Encounters:  09/19/14 202 lb (91.627 kg)  02/11/14 204 lb (92.534 kg)  02/05/14 208 lb (94.348 kg)    Physical Exam  Constitutional: He is oriented to person, place, and time. He appears well-developed. No distress.  NAD  HENT:  Mouth/Throat: Oropharynx is clear and moist.  Eyes: Conjunctivae are normal. Pupils are equal,  round, and reactive to light.  Neck: Normal range of motion. No JVD present. No thyromegaly present.  Cardiovascular: Normal rate, regular rhythm, normal heart sounds and intact distal pulses.  Exam reveals no gallop and no friction rub.   No murmur heard. Pulmonary/Chest: Effort normal and breath sounds normal. No respiratory distress. He has no wheezes. He has no rales. He exhibits no tenderness.  Abdominal: Soft. Bowel sounds are normal. He exhibits no distension and no mass. There is no tenderness. There is no rebound  and no guarding.  Musculoskeletal: Normal range of motion. He exhibits tenderness. He exhibits no edema.  Lymphadenopathy:    He has no cervical adenopathy.  Neurological: He is alert and oriented to person, place, and time. He has normal reflexes. No cranial nerve deficit. He exhibits normal muscle tone. He displays a negative Romberg sign. Coordination and gait normal.  Skin: Skin is warm and dry. Rash noted.  Psychiatric: He has a normal mood and affect. His behavior is normal. Judgment and thought content normal.  eryth pap rash - trunk, arms Limp  Lab Results  Component Value Date   WBC 6.1 08/10/2012   HGB 14.9 08/10/2012   HCT 43.2 08/10/2012   PLT 184.0 08/10/2012   GLUCOSE 90 08/10/2012   CHOL 204* 08/10/2012   TRIG 391.0* 08/10/2012   HDL 45.80 08/10/2012   LDLDIRECT 103.7 08/10/2012   ALT 50 08/10/2012   AST 31 08/10/2012   NA 136 08/10/2012   K 3.8 08/10/2012   CL 101 08/10/2012   CREATININE 1.0 08/10/2012   BUN 14 08/10/2012   CO2 28 08/10/2012   TSH 2.54 08/10/2012   PSA 0.45 08/10/2012    No results found.  Assessment & Plan:   There are no diagnoses linked to this encounter. I have discontinued Mr. Sitzman predniSONE and sulfamethoxazole-trimethoprim. I am also having him maintain his azelastine, valsartan-hydrochlorothiazide, QVAR, fluticasone, albuterol, clonazePAM, Colesevelam HCl, DIOVAN HCT, escitalopram, pantoprazole, and triamcinolone cream.  Meds ordered this encounter  Medications  . triamcinolone cream (KENALOG) 0.1 %    Sig: Apply 2 times a day to body. Never to the face.     Follow-up: No Follow-up on file.  Walker Kehr, MD

## 2014-09-19 NOTE — Assessment & Plan Note (Signed)
2016 ?allergy to Rx -?HCTZ D/c HCTZ Claritin qd

## 2014-09-19 NOTE — Patient Instructions (Signed)
Valerian root 

## 2014-09-19 NOTE — Assessment & Plan Note (Signed)
Options to treat presented Mirapex to try Clonazepam prn Valerian root

## 2014-09-19 NOTE — Progress Notes (Signed)
Pre visit review using our clinic review tool, if applicable. No additional management support is needed unless otherwise documented below in the visit note. 

## 2014-09-23 ENCOUNTER — Ambulatory Visit: Payer: BLUE CROSS/BLUE SHIELD | Admitting: Internal Medicine

## 2014-09-23 ENCOUNTER — Encounter: Payer: Self-pay | Admitting: Internal Medicine

## 2014-09-24 ENCOUNTER — Other Ambulatory Visit: Payer: Self-pay | Admitting: Internal Medicine

## 2014-09-24 MED ORDER — DIOVAN 160 MG PO TABS: 160.0000 mg | ORAL_TABLET | Freq: Every day | ORAL | Status: DC

## 2014-09-27 ENCOUNTER — Ambulatory Visit: Payer: BLUE CROSS/BLUE SHIELD | Admitting: Internal Medicine

## 2014-10-03 ENCOUNTER — Encounter: Payer: Self-pay | Admitting: Internal Medicine

## 2014-10-04 ENCOUNTER — Other Ambulatory Visit: Payer: Self-pay | Admitting: Internal Medicine

## 2014-10-04 MED ORDER — DIOVAN 160 MG PO TABS: 160.0000 mg | ORAL_TABLET | Freq: Every day | ORAL | Status: DC

## 2014-10-17 ENCOUNTER — Ambulatory Visit: Payer: BLUE CROSS/BLUE SHIELD | Admitting: Internal Medicine

## 2014-12-14 ENCOUNTER — Emergency Department
Admission: EM | Admit: 2014-12-14 | Discharge: 2014-12-14 | Disposition: A | Discharge status: Home / Self Care | Payer: BLUE CROSS/BLUE SHIELD | Source: Home / Self Care | Attending: Family Medicine | Admitting: Family Medicine

## 2014-12-14 ENCOUNTER — Encounter: Payer: Self-pay | Admitting: Emergency Medicine

## 2014-12-14 DIAGNOSIS — J209 Acute bronchitis, unspecified: Secondary | ICD-10-CM | POA: Diagnosis not present

## 2014-12-14 MED ORDER — AZITHROMYCIN 250 MG PO TABS: 250.0000 mg | ORAL_TABLET | Freq: Every day | ORAL | Status: DC

## 2014-12-14 MED ORDER — PREDNISONE 20 MG PO TABS: ORAL_TABLET | ORAL | Status: DC

## 2014-12-14 NOTE — ED Notes (Signed)
Pt c/o non productive cough and chest congestion x3 weeks. He has asthma and c/o frequent wheezing.

## 2014-12-14 NOTE — ED Provider Notes (Signed)
CSN: [id number]     Arrival date & time 12/14/14  1659 History   First MD Initiated Contact with Patient 12/14/14 1707     Chief Complaint  Patient presents with  . Cough   (Consider location/radiation/quality/duration/timing/severity/associated sxs/prior Treatment) HPI  Pt is a 49yo male presenting to [facility] with c/o gradually worsening moderately intermittent non-productive cough for 3 weeks.  Pt reports hx of asthma and has been having increased frequency of wheezing.  He reports mild sore throat and increased sneezing and rhinorrhea. Sick contacts at home had mild congestion and improved within a few days but pt is not improving. Denies fever, n/v/d. Denies chest pain or SOB at this time. He does have a ProAir albuterol inhaler at home he uses as needed.   Past Medical History  Diagnosis Date  . Anxiety   . GERD (gastroesophageal reflux disease)   . HTN (hypertension)   . Rosacea   . Allergic rhinitis   . Asthma    Past Surgical History  Procedure Laterality Date  . Nasal sinus surgery    . Cholecystectomy     Family History  Problem Relation Age of Onset  . Hypertension Other   . Diabetes Other     1st degree relative   . Hypertension Other    Social History  Substance Use Topics  . Smoking status: Never Smoker   . Smokeless tobacco: None  . Alcohol Use: 7.0 oz/week    14 drink(s) per week     Comment: 2/day    Review of Systems  Constitutional: Negative for fever and chills.  HENT: Positive for congestion, rhinorrhea and sneezing. Negative for ear pain, sore throat, trouble swallowing and voice change.   Respiratory: Positive for cough and wheezing. Negative for shortness of breath.   Cardiovascular: Negative for chest pain and palpitations.  Gastrointestinal: Negative for nausea, vomiting, abdominal pain and diarrhea.  Musculoskeletal: Negative for myalgias, back pain and arthralgias.  Skin: Negative for rash.    Allergies  Midazolam; Molds & smuts; Other; and  Pollen extract  Home Medications   Prior to Admission medications   Medication Sig Start Date End Date Taking? Authorizing Provider  albuterol (PROAIR HFA) 108 (90 BASE) MCG/ACT inhaler Inhale 2 puffs into the lungs every 4 (four) hours as needed for wheezing or shortness of breath. 11/25/13   Jacqulyn Cane, MD  azelastine (OPTIVAR) 0.05 % ophthalmic solution Place 1 drop into both eyes 2 (two) times daily. 11/26/11   Aleksei Plotnikov V, MD  azithromycin (ZITHROMAX) 250 MG tablet Take 1 tablet (250 mg total) by mouth daily. Take first 2 tablets together, then 1 every day until finished. 12/14/14   Noland Fordyce, PA-C  clonazePAM (KLONOPIN) 0.5 MG tablet Take 1 tablet (0.5 mg total) by mouth at bedtime as needed. 09/19/14   Aleksei Plotnikov V, MD  Colesevelam HCl [facility]) 3.75 G PACK 1 pack qd 03/12/14   Aleksei Plotnikov V, MD  DIOVAN 160 MG tablet Take 1 tablet (160 mg total) by mouth daily. 10/04/14   Aleksei Plotnikov V, MD  escitalopram (LEXAPRO) 10 MG tablet TAKE 1 TABLET DAILY. MUST KEEP YEARLY APPOINTMENT FOR ADDITIONAL REFILLS 08/05/14   Cassandria Anger, MD  fluticasone [facility]) 50 MCG/ACT nasal spray 1 or 2 sprays each nostril twice a day 11/25/13   Jacqulyn Cane, MD  pantoprazole (PROTONIX) 40 MG tablet Take 1 tablet (40 mg total) by mouth daily. 08/19/14   Aleksei Plotnikov V, MD  pramipexole (MIRAPEX) 0.25 MG tablet Take 1-2  tablets (0.25-0.5 mg total) by mouth at bedtime. 09/19/14   Aleksei Plotnikov V, MD  predniSONE (DELTASONE) 20 MG tablet 3 tabs po day one, then 2 po daily x 4 days 12/14/14   Noland Fordyce, PA-C  QVAR 80 MCG/ACT inhaler INHALE 1 PUFF INTO THE LUNGS TWICE A DAY    Aleksei Plotnikov V, MD  triamcinolone cream (KENALOG) 0.1 % Apply 2 times a day to body. Never to the face. 04/02/14   Historical Provider, MD   Meds Ordered and Administered this Visit  Medications - No data to display  BP 133/81 mmHg  Pulse 94  Temp(Src) 97.8 F (36.6 C) (Oral)  Wt 207 lb (93.895 kg)   SpO2 97% No data found.   Physical Exam  Constitutional: He appears well-developed and well-nourished.  HENT:  Head: Normocephalic and atraumatic.  Right Ear: Hearing, tympanic membrane, external ear and ear canal normal.  Left Ear: Hearing, tympanic membrane, external ear and ear canal normal.  Nose: Nose normal.  Mouth/Throat: Uvula is midline and mucous membranes are normal. Posterior oropharyngeal erythema present. No oropharyngeal exudate, posterior oropharyngeal edema or tonsillar abscesses.  Eyes: Conjunctivae are normal. No scleral icterus.  Neck: Normal range of motion. Neck supple.  Cardiovascular: Normal rate, regular rhythm and normal heart sounds.   Pulmonary/Chest: Effort normal. No respiratory distress. He has wheezes in the right lower field. He has rhonchi in the right lower field. He has no rales. He exhibits no tenderness.  Abdominal: Soft. He exhibits no distension and no mass. There is no tenderness. There is no rebound and no guarding.  Musculoskeletal: Normal range of motion.  Neurological: He is alert.  Skin: Skin is warm and dry.  Nursing note and vitals reviewed.   ED Course  Procedures (including critical care time)  Labs Review Labs Reviewed - No data to display  Imaging Review No results found.    MDM   1. Acute bronchitis, unspecified organism     Pt c/o worsening non-productive cough and wheeze for 3 weeks. Hx of asthma No respiratory distress. O2 Sat 97% on RA Lungs: wheeze and rhonchi in Right lower lung field. Rx: azithromycin and prednisone.  F/u with PCP in 7-10 days if not improving, sooner if worsening. Patient verbalized understanding and agreement with treatment plan.    Noland Fordyce, PA-C 12/14/14 [phone]

## 2014-12-14 NOTE — Discharge Instructions (Signed)

## 2015-03-25 ENCOUNTER — Other Ambulatory Visit: Payer: Self-pay

## 2015-03-25 MED ORDER — DIOVAN 160 MG PO TABS: 160.0000 mg | ORAL_TABLET | Freq: Every day | ORAL | Status: DC

## 2015-05-28 ENCOUNTER — Emergency Department
Admission: EM | Admit: 2015-05-28 | Discharge: 2015-05-28 | Disposition: A | Discharge status: Home / Self Care | Payer: BLUE CROSS/BLUE SHIELD | Source: Home / Self Care | Attending: Family Medicine | Admitting: Family Medicine

## 2015-05-28 ENCOUNTER — Encounter: Payer: Self-pay | Admitting: *Deleted

## 2015-05-28 DIAGNOSIS — B9789 Other viral agents as the cause of diseases classified elsewhere: Secondary | ICD-10-CM

## 2015-05-28 DIAGNOSIS — J069 Acute upper respiratory infection, unspecified: Secondary | ICD-10-CM

## 2015-05-28 DIAGNOSIS — H66002 Acute suppurative otitis media without spontaneous rupture of ear drum, left ear: Secondary | ICD-10-CM | POA: Diagnosis not present

## 2015-05-28 MED ORDER — AMOXICILLIN 250 MG PO CAPS: ORAL_CAPSULE | ORAL | Status: DC

## 2015-05-28 MED ORDER — PREDNISONE 20 MG PO TABS: ORAL_TABLET | ORAL | Status: DC

## 2015-05-28 NOTE — ED Provider Notes (Signed)
CSN: [id number]     Arrival date & time 05/28/15  [id number] History   First MD Initiated Contact with Patient 05/28/15 [phone]     Chief Complaint  Patient presents with  . Otalgia  . Nasal Congestion      HPI Comments: Patient complains of one week history of typical cold-like symptoms developing over several days,  including mild sore throat, sinus congestion, headache, fatigue, and cough.  He has now developed a left earache over the past two days.  He has a history of asthma, controlled with QVAR, and perennial rhinitis.  The history is provided by the patient.    Past Medical History  Diagnosis Date  . Anxiety   . GERD (gastroesophageal reflux disease)   . HTN (hypertension)   . Rosacea   . Allergic rhinitis   . Asthma    Past Surgical History  Procedure Laterality Date  . Nasal sinus surgery    . Cholecystectomy    . Total hip arthroplasty     Family History  Problem Relation Age of Onset  . Hypertension Other   . Diabetes Other     1st degree relative   . Hypertension Other    Social History  Substance Use Topics  . Smoking status: Never Smoker   . Smokeless tobacco: None  . Alcohol Use: 7.0 oz/week    14 Standard drinks or equivalent per week     Comment: 2/day    Review of Systems + sore throat + cough No pleuritic pain No wheezing + nasal congestion + post-nasal drainage No sinus pain/pressure No itchy/red eyes + left earache No hemoptysis No SOB No fever, + chills/sweats No nausea No vomiting No abdominal pain No diarrhea No urinary symptoms No skin rash + fatigue + myalgias No headache Used OTC meds without relief  Allergies  Midazolam; Molds & smuts; Other; and Pollen extract  Home Medications   Prior to Admission medications   Medication Sig Start Date End Date Taking? Authorizing Provider  albuterol (PROAIR HFA) 108 (90 BASE) MCG/ACT inhaler Inhale 2 puffs into the lungs every 4 (four) hours as needed for wheezing or shortness of breath.  11/25/13   Jacqulyn Cane, MD  amoxicillin (AMOXIL) 250 MG capsule Take 2 caps by mouth every 8 hours 05/28/15   Kandra Nicolas, MD  azelastine (OPTIVAR) 0.05 % ophthalmic solution Place 1 drop into both eyes 2 (two) times daily. 11/26/11   Aleksei Plotnikov V, MD  clonazePAM (KLONOPIN) 0.5 MG tablet Take 1 tablet (0.5 mg total) by mouth at bedtime as needed. 09/19/14   Aleksei Plotnikov V, MD  Colesevelam HCl [facility]) 3.75 G PACK 1 pack qd 03/12/14   Aleksei Plotnikov V, MD  DIOVAN 160 MG tablet Take 1 tablet (160 mg total) by mouth daily. 03/25/15   Aleksei Plotnikov V, MD  escitalopram (LEXAPRO) 10 MG tablet TAKE 1 TABLET DAILY. MUST KEEP YEARLY APPOINTMENT FOR ADDITIONAL REFILLS 08/05/14   Cassandria Anger, MD  fluticasone [facility]) 50 MCG/ACT nasal spray 1 or 2 sprays each nostril twice a day 11/25/13   Jacqulyn Cane, MD  pantoprazole (PROTONIX) 40 MG tablet Take 1 tablet (40 mg total) by mouth daily. 08/19/14   Aleksei Plotnikov V, MD  pramipexole (MIRAPEX) 0.25 MG tablet Take 1-2 tablets (0.25-0.5 mg total) by mouth at bedtime. 09/19/14   Aleksei Plotnikov V, MD  predniSONE (DELTASONE) 20 MG tablet Take one tab by mouth twice daily for 5 days, then one daily for 3 days. Take with  food. 05/28/15   Kandra Nicolas, MD  QVAR 80 MCG/ACT inhaler INHALE 1 PUFF INTO THE LUNGS TWICE A DAY    Aleksei Plotnikov V, MD  triamcinolone cream (KENALOG) 0.1 % Apply 2 times a day to body. Never to the face. 04/02/14   Historical Provider, MD   Meds Ordered and Administered this Visit  Medications - No data to display  BP 130/85 mmHg  Pulse 71  Temp(Src) 98.1 F (36.7 C) (Oral)  Resp 16  Ht 6' (1.829 m)  Wt 207 lb (93.895 kg)  BMI 28.07 kg/m2  SpO2 97% No data found.   Physical Exam Nursing notes and Vital Signs reviewed. Appearance:  Patient appears stated age, and in no acute distress Eyes:  Pupils are equal, round, and reactive to light and accomodation.  Extraocular movement is intact.  Conjunctivae are  not inflamed  Ears:  Canals normal.  Right tympanic membrane normal.  Left tympanic membrane erythematous and bulging. Nose:  Congested turbinates.  No sinus tenderness.  Pharynx:  Normal Neck:  Supple.  Tender enlarged posterior/lateral nodes are palpated bilaterally  Lungs:  Clear to auscultation.  Breath sounds are equal.  Moving air well. Heart:  Regular rate and rhythm without murmurs, rubs, or gallops.  Abdomen:  Nontender without masses or hepatosplenomegaly.  Bowel sounds are present.  No CVA or flank tenderness.  Extremities:  No edema.  Skin:  No rash present.   ED Course  Procedures none   MDM   1. Acute suppurative otitis media of left ear without spontaneous rupture of tympanic membrane, recurrence not specified   2. Viral URI with cough    Begin amoxicillin, and prednisone burst/taper. Take plain guaifenesin (such as Robitussin syrup) every 4 hours, with plenty of water, for cough and congestion.  Get adequate rest.   May use Afrin nasal spray (or generic oxymetazoline) twice daily for about 5 days and then discontinue.  Also recommend using saline nasal spray several times daily and saline nasal irrigation (AYR is a common brand).  Use Flonase nasal spray each morning after using Afrin nasal spray and saline nasal irrigation. Try warm salt water gargles for sore throat.  Stop all antihistamines for now, and other non-prescription cough/cold preparations. May take Delsym Cough Suppressant at bedtime for nighttime cough.  Continue QVAR inhaler. Follow-up with family doctor if not improving about10 days.     Kandra Nicolas, MD 05/28/15 [phone]

## 2015-05-28 NOTE — Discharge Instructions (Signed)
Take plain guaifenesin (such as Robitussin syrup) every 4 hours, with plenty of water, for cough and congestion.  Get adequate rest.   May use Afrin nasal spray (or generic oxymetazoline) twice daily for about 5 days and then discontinue.  Also recommend using saline nasal spray several times daily and saline nasal irrigation (AYR is a common brand).  Use Flonase nasal spray each morning after using Afrin nasal spray and saline nasal irrigation. Try warm salt water gargles for sore throat.  Stop all antihistamines for now, and other non-prescription cough/cold preparations. May take Delsym Cough Suppressant at bedtime for nighttime cough.  Continue QVAR inhaler. Follow-up with family doctor if not improving about10 days.

## 2015-05-28 NOTE — ED Notes (Signed)
Pt c/o LT ear pain and sinus pressure post URI x 1 wk. Denies fever.

## 2015-06-06 ENCOUNTER — Emergency Department
Admission: EM | Admit: 2015-06-06 | Discharge: 2015-06-06 | Disposition: A | Discharge status: Home / Self Care | Payer: BLUE CROSS/BLUE SHIELD | Source: Home / Self Care | Attending: Emergency Medicine | Admitting: Emergency Medicine

## 2015-06-06 ENCOUNTER — Encounter: Payer: Self-pay | Admitting: *Deleted

## 2015-06-06 DIAGNOSIS — J209 Acute bronchitis, unspecified: Secondary | ICD-10-CM | POA: Diagnosis not present

## 2015-06-06 DIAGNOSIS — J309 Allergic rhinitis, unspecified: Secondary | ICD-10-CM

## 2015-06-06 MED ORDER — CEFDINIR 250 MG/5ML PO SUSR: 300.0000 mg | Freq: Two times a day (BID) | ORAL | Status: DC

## 2015-06-06 MED ORDER — PROMETHAZINE-CODEINE 6.25-10 MG/5ML PO SYRP: ORAL_SOLUTION | ORAL | Status: DC

## 2015-06-06 MED ORDER — FLUTICASONE PROPIONATE 50 MCG/ACT NA SUSP: NASAL | Status: DC

## 2015-06-06 MED ORDER — PREDNISONE 10 MG (21) PO TBPK: ORAL_TABLET | ORAL | Status: DC

## 2015-06-06 NOTE — ED Notes (Signed)
Pt seen on 05/28/15 for uri and ear pain. He is almost finished with Amox Rx, not much improvement,

## 2015-06-09 NOTE — ED Provider Notes (Signed)
CSN: [id number]     Arrival date & time 06/06/15  1617 History   First MD Initiated Contact with Patient 06/06/15 1633     Chief Complaint  Patient presents with  . Cough  . Otalgia   Pt seen on 05/28/15 for uri and ear pain. He is almost finished with Amox Rx, not much improvement,  HPI  50 y.o. male who complains of onset of sinus and cold symptoms for 15 days.  No chills/sweats +  Low-gradeFever  +  Nasal congestion +  Discolored Post-nasal drainage + sinus pain/pressure No sore throat + seasonal allery sxs +   Cough, occ yellow sputum No wheezing No chest congestion No hemoptysis No shortness of breath No pleuritic pain  No itchy/red eyes +  bilat earache, ear pressure  No nausea No vomiting No abdominal pain No diarrhea  No skin rashes +  Fatigue No myalgias No headache   Past Medical History  Diagnosis Date  . Anxiety   . GERD (gastroesophageal reflux disease)   . HTN (hypertension)   . Rosacea   . Allergic rhinitis   . Asthma    Past Surgical History  Procedure Laterality Date  . Nasal sinus surgery    . Cholecystectomy    . Total hip arthroplasty     Family History  Problem Relation Age of Onset  . Hypertension Other   . Diabetes Other     1st degree relative   . Hypertension Other    Social History  Substance Use Topics  . Smoking status: Never Smoker   . Smokeless tobacco: None  . Alcohol Use: 7.0 oz/week    14 Standard drinks or equivalent per week     Comment: 2/day    Review of Systems  All other systems reviewed and are negative.   Allergies  Midazolam; Molds & smuts; Other; and Pollen extract  Home Medications   Prior to Admission medications   Medication Sig Start Date End Date Taking? Authorizing Provider  albuterol (PROAIR HFA) 108 (90 BASE) MCG/ACT inhaler Inhale 2 puffs into the lungs every 4 (four) hours as needed for wheezing or shortness of breath. 11/25/13   Jacqulyn Cane, MD  amoxicillin (AMOXIL) 250 MG capsule Take  2 caps by mouth every 8 hours 05/28/15   Kandra Nicolas, MD  azelastine (OPTIVAR) 0.05 % ophthalmic solution Place 1 drop into both eyes 2 (two) times daily. 11/26/11   Aleksei Plotnikov V, MD  cefdinir (OMNICEF) 250 MG/5ML suspension Take 6 mLs (300 mg total) by mouth 2 (two) times daily. For 10 days 06/06/15   Jacqulyn Cane, MD  clonazePAM (KLONOPIN) 0.5 MG tablet Take 1 tablet (0.5 mg total) by mouth at bedtime as needed. 09/19/14   Aleksei Plotnikov V, MD  Colesevelam HCl [facility] 3.75 G PACK 1 pack qd 03/12/14   Aleksei Plotnikov V, MD  DIOVAN 160 MG tablet Take 1 tablet (160 mg total) by mouth daily. 03/25/15   Aleksei Plotnikov V, MD  escitalopram (LEXAPRO) 10 MG tablet TAKE 1 TABLET DAILY. MUST KEEP YEARLY APPOINTMENT FOR ADDITIONAL REFILLS 08/05/14   Cassandria Anger, MD  fluticasone [facility]) 50 MCG/ACT nasal spray 1 or 2 sprays each nostril twice a day 11/25/13   Jacqulyn Cane, MD  fluticasone [facility]) 50 MCG/ACT nasal spray 1 or 2 sprays each nostril twice a day 06/06/15   Jacqulyn Cane, MD  pantoprazole (PROTONIX) 40 MG tablet Take 1 tablet (40 mg total) by mouth daily. 08/19/14   Aleksei Plotnikov V,  MD  pramipexole (MIRAPEX) 0.25 MG tablet Take 1-2 tablets (0.25-0.5 mg total) by mouth at bedtime. 09/19/14   Aleksei Plotnikov V, MD  predniSONE (STERAPRED UNI-PAK 21 TAB) 10 MG (21) TBPK tablet Take as directed for 6 days.--Take 6 on day 1, 5 on day 2, 4 on day 3, then 3 tablets on day 4, then 2 tablets on day 5, then 1 on day 6. 06/06/15   Jacqulyn Cane, MD  promethazine-codeine [facility] WITH CODEINE) 6.25-10 MG/5ML syrup Take 1-2 teaspoons every 4-6 hours as needed for cough. May cause drowsiness. 06/06/15   Jacqulyn Cane, MD  QVAR 80 MCG/ACT inhaler INHALE 1 PUFF INTO THE LUNGS TWICE A DAY    Aleksei Plotnikov V, MD  triamcinolone cream (KENALOG) 0.1 % Apply 2 times a day to body. Never to the face. 04/02/14   Historical Provider, MD   Meds Ordered and Administered this Visit  Medications - No  data to display  BP 130/76 mmHg  Pulse 80  Temp(Src) 98.4 F (36.9 C) (Oral)  SpO2 98% No data found.   Physical Exam  Constitutional: He is oriented to person, place, and time. He appears well-developed and well-nourished. No distress.  HENT:  Head: Normocephalic and atraumatic.  Right Ear: Tympanic membrane, external ear and ear canal normal.  Left Ear: Tympanic membrane, external ear and ear canal normal.  Nose: Mucosal edema and rhinorrhea present. Right sinus exhibits maxillary sinus tenderness. Left sinus exhibits maxillary sinus tenderness.  Mouth/Throat: Oropharynx is clear and moist. No oral lesions. No oropharyngeal exudate.  Eyes: Right eye exhibits no discharge. Left eye exhibits no discharge. No scleral icterus.  Neck: Neck supple.  Cardiovascular: Normal rate, regular rhythm and normal heart sounds.   Pulmonary/Chest: Effort normal. He has wheezes (mild, late exp. But good air movement bilat). He has rhonchi. He has no rales.  Lymphadenopathy:    He has no cervical adenopathy.  Neurological: He is alert and oriented to person, place, and time.  Skin: Skin is warm and dry. No rash noted.  Nursing note and vitals reviewed.   ED Course  Procedures (including critical care time)  Labs Review Labs Reviewed - No data to display  Imaging Review No results found.   Visual Acuity Review  Right Eye Distance:   Left Eye Distance:   Bilateral Distance:    Right Eye Near:   Left Eye Near:    Bilateral Near:         MDM   1. Acute bronchitis with bronchospasm   2. Allergic sinusitis    Treatment options discussed, as well as risks, benefits, alternatives. Patient voiced understanding and agreement with the following plans: Discharge Medication List as of 06/06/2015  5:02 PM    START taking these medications   Details  cefdinir (OMNICEF) 250 MG/5ML suspension Take 6 mLs (300 mg total) by mouth 2 (two) times daily. For 10 days, Starting 06/06/2015, Until  Discontinued, Print  (pt preferred liquid form of this)   fluticasone (FLONASE) 50 MCG/ACT nasal spray 1 or 2 sprays each nostril twice a day, Print    predniSONE (STERAPRED UNI-PAK 21 TAB) 10 MG (21) TBPK tablet Take as directed for 6 days.--Take 6 on day 1, 5 on day 2, 4 on day 3, then 3 tablets on day 4, then 2 tablets on day 5, then 1 on day 6., Print    promethazine-codeine (PHENERGAN WITH CODEINE) 6.25-10 MG/5ML syrup Take 1-2 teaspoons every 4-6 hours as needed for cough. May cause drowsiness.,  Print        Other advice given. Follow-up with your primary care doctor in 5-7 days if not improving, or sooner if symptoms become worse. Precautions discussed. Red flags discussed. Questions invited and answered. Patient voiced understanding and agreement.     Jacqulyn Cane, MD 06/09/15 [phone]

## 2015-07-08 DIAGNOSIS — Z96642 Presence of left artificial hip joint: Secondary | ICD-10-CM | POA: Diagnosis not present

## 2015-07-08 DIAGNOSIS — I10 Essential (primary) hypertension: Secondary | ICD-10-CM | POA: Diagnosis not present

## 2015-07-08 DIAGNOSIS — Z471 Aftercare following joint replacement surgery: Secondary | ICD-10-CM | POA: Diagnosis not present

## 2015-07-10 ENCOUNTER — Encounter: Payer: Self-pay | Admitting: Internal Medicine

## 2015-07-10 ENCOUNTER — Other Ambulatory Visit: Payer: Self-pay | Admitting: Internal Medicine

## 2015-07-10 NOTE — Telephone Encounter (Signed)
Was Diovan HCT d/c? Please advise ok to Rf?

## 2015-07-11 ENCOUNTER — Other Ambulatory Visit: Payer: Self-pay | Admitting: *Deleted

## 2015-07-11 MED ORDER — DIOVAN HCT 160-12.5 MG PO TABS: 1.0000 | ORAL_TABLET | Freq: Every day | ORAL | Status: DC

## 2015-07-22 ENCOUNTER — Encounter: Payer: Self-pay | Admitting: Internal Medicine

## 2015-07-31 ENCOUNTER — Other Ambulatory Visit: Payer: Self-pay | Admitting: Internal Medicine

## 2015-08-20 ENCOUNTER — Ambulatory Visit: Payer: BLUE CROSS/BLUE SHIELD | Admitting: Internal Medicine

## 2015-08-25 ENCOUNTER — Other Ambulatory Visit: Payer: Self-pay | Admitting: Internal Medicine

## 2015-08-26 ENCOUNTER — Encounter: Payer: Self-pay | Admitting: Internal Medicine

## 2015-08-28 ENCOUNTER — Other Ambulatory Visit: Payer: Self-pay | Admitting: Internal Medicine

## 2015-08-28 DIAGNOSIS — Z0000 Encounter for general adult medical examination without abnormal findings: Secondary | ICD-10-CM

## 2015-08-28 MED ORDER — PANTOPRAZOLE SODIUM 40 MG PO TBEC: 40.0000 mg | DELAYED_RELEASE_TABLET | Freq: Every day | ORAL | 3 refills | Status: DC

## 2015-09-02 ENCOUNTER — Telehealth: Payer: Self-pay | Admitting: Internal Medicine

## 2015-09-02 NOTE — Telephone Encounter (Signed)
Please make sure labs are good for patient to have done tomorrow.

## 2015-09-02 NOTE — Telephone Encounter (Signed)
Lab orders are entered. Pt informed

## 2015-09-04 ENCOUNTER — Other Ambulatory Visit (INDEPENDENT_AMBULATORY_CARE_PROVIDER_SITE_OTHER): Payer: BLUE CROSS/BLUE SHIELD

## 2015-09-04 DIAGNOSIS — Z0000 Encounter for general adult medical examination without abnormal findings: Secondary | ICD-10-CM | POA: Diagnosis not present

## 2015-09-04 LAB — BASIC METABOLIC PANEL
BUN: 14 mg/dL (ref 6–23)
CHLORIDE: 101 meq/L (ref 96–112)
CO2: 29 meq/L (ref 19–32)
Calcium: 9.4 mg/dL (ref 8.4–10.5)
Creatinine, Ser: 0.95 mg/dL (ref 0.40–1.50)
GFR: 88.94 mL/min (ref >60.00)
Glucose, Bld: 103 mg/dL — ABNORMAL HIGH (ref 70–99)
POTASSIUM: 3.8 meq/L (ref 3.5–5.1)
SODIUM: 139 meq/L (ref 135–145)

## 2015-09-04 LAB — HEPATIC FUNCTION PANEL
ALBUMIN: 4.4 g/dL (ref 3.5–5.2)
ALT: 51 U/L (ref 0–53)
AST: 31 U/L (ref 0–37)
Alkaline Phosphatase: 44 U/L (ref 39–117)
BILIRUBIN DIRECT: 0.1 mg/dL (ref 0.0–0.3)
Total Bilirubin: 0.6 mg/dL (ref 0.2–1.2)
Total Protein: 6.9 g/dL (ref 6.0–8.3)

## 2015-09-04 LAB — CBC WITH DIFFERENTIAL/PLATELET
Basophils Absolute: 0 10*3/uL (ref 0.0–0.1)
Basophils Relative: 0.5 % (ref 0.0–3.0)
EOS PCT: 6.4 % — AB (ref 0.0–5.0)
Eosinophils Absolute: 0.4 10*3/uL (ref 0.0–0.7)
HCT: 44.4 % (ref 39.0–52.0)
Hemoglobin: 15 g/dL (ref 13.0–17.0)
LYMPHS ABS: 2.5 10*3/uL (ref 0.7–4.0)
Lymphocytes Relative: 38.4 % (ref 12.0–46.0)
MCHC: 33.8 g/dL (ref 30.0–36.0)
MCV: 92.8 fl (ref 78.0–100.0)
MONO ABS: 0.6 10*3/uL (ref 0.1–1.0)
Monocytes Relative: 9.6 % (ref 3.0–12.0)
NEUTROS ABS: 3 10*3/uL (ref 1.4–7.7)
Neutrophils Relative %: 45.1 % (ref 43.0–77.0)
PLATELETS: 195 10*3/uL (ref 150.0–400.0)
RBC: 4.79 Mil/uL (ref 4.22–5.81)
RDW: 13.1 % (ref 11.5–15.5)
WBC: 6.6 10*3/uL (ref 4.0–10.5)

## 2015-09-04 LAB — LIPID PANEL
Cholesterol: 211 mg/dL — ABNORMAL HIGH (ref 0–200)
HDL: 42 mg/dL (ref >39.00)
Total CHOL/HDL Ratio: 5

## 2015-09-04 LAB — URINALYSIS, ROUTINE W REFLEX MICROSCOPIC
BILIRUBIN URINE: NEGATIVE
KETONES UR: NEGATIVE
Leukocytes, UA: NEGATIVE
Nitrite: NEGATIVE
PH: 5.5 (ref 5.0–8.0)
SPECIFIC GRAVITY, URINE: 1.025 (ref 1.000–1.030)
Total Protein, Urine: NEGATIVE
UROBILINOGEN UA: 0.2 (ref 0.0–1.0)
Urine Glucose: NEGATIVE
WBC UA: NONE SEEN (ref 0–?)

## 2015-09-04 LAB — TSH: TSH: 3.1 u[IU]/mL (ref 0.35–4.50)

## 2015-09-04 LAB — PSA: PSA: 0.49 ng/mL (ref 0.10–4.00)

## 2015-09-04 LAB — LDL CHOLESTEROL, DIRECT: LDL DIRECT: 87 mg/dL

## 2015-09-10 ENCOUNTER — Encounter: Payer: Self-pay | Admitting: Internal Medicine

## 2015-09-10 ENCOUNTER — Ambulatory Visit (INDEPENDENT_AMBULATORY_CARE_PROVIDER_SITE_OTHER): Payer: BLUE CROSS/BLUE SHIELD | Admitting: Internal Medicine

## 2015-09-10 VITALS — BP 140/94 | HR 89 | Ht 72.0 in | Wt 203.0 lb

## 2015-09-10 DIAGNOSIS — I10 Essential (primary) hypertension: Secondary | ICD-10-CM | POA: Diagnosis not present

## 2015-09-10 DIAGNOSIS — K219 Gastro-esophageal reflux disease without esophagitis: Secondary | ICD-10-CM | POA: Diagnosis not present

## 2015-09-10 DIAGNOSIS — E785 Hyperlipidemia, unspecified: Secondary | ICD-10-CM | POA: Diagnosis not present

## 2015-09-10 MED ORDER — FENOFIBRATE 160 MG PO TABS: 160.0000 mg | ORAL_TABLET | Freq: Every day | ORAL | 3 refills | Status: DC

## 2015-09-10 NOTE — Assessment & Plan Note (Signed)
Cut back on salt Diovan

## 2015-09-10 NOTE — Assessment & Plan Note (Signed)
Protonix.  ?

## 2015-09-10 NOTE — Assessment & Plan Note (Addendum)
W/elev TG Fenofibrate Reduce ETOH. Stop adding coke

## 2015-09-10 NOTE — Progress Notes (Signed)
Pre visit review using our clinic review tool, if applicable. No additional management support is needed unless otherwise documented below in the visit note. 

## 2015-09-10 NOTE — Progress Notes (Signed)
Subjective:  Patient ID: Darrell Santos, male    DOB: 1965/06/15  Age: 50 y.o. MRN: [id number]  CC: No chief complaint on file.   HPI Darrell Santos presents for a well exam F/u HTN, anxiety, GERD f/u  Outpatient Medications Prior to Visit  Medication Sig Dispense Refill  . albuterol (PROAIR HFA) 108 (90 BASE) MCG/ACT inhaler Inhale 2 puffs into the lungs every 4 (four) hours as needed for wheezing or shortness of breath. 1 Inhaler 0  . azelastine (OPTIVAR) 0.05 % ophthalmic solution Place 1 drop into both eyes 2 (two) times daily. 6 mL 12  . clonazePAM (KLONOPIN) 0.5 MG tablet Take 1 tablet (0.5 mg total) by mouth at bedtime as needed. 90 tablet 1  . Colesevelam HCl (WELCHOL) 3.75 G PACK 1 pack qd 90 each 3  . DIOVAN HCT 160-12.5 MG tablet Take 1 tablet by mouth daily. Enough until receive mail order 30 tablet 0  . escitalopram (LEXAPRO) 10 MG tablet TAKE 1 TABLET DAILY (MUST KEEP YEARLY APPOINTMENT FOR ADDITIONAL REFILLS) 90 tablet 0  . fluticasone (FLONASE) 50 MCG/ACT nasal spray 1 or 2 sprays each nostril twice a day 16 g 0  . pantoprazole (PROTONIX) 40 MG tablet Take 1 tablet (40 mg total) by mouth daily. 90 tablet 3  . pramipexole (MIRAPEX) 0.25 MG tablet Take 1-2 tablets (0.25-0.5 mg total) by mouth at bedtime. 180 tablet 3  . predniSONE (STERAPRED UNI-PAK 21 TAB) 10 MG (21) TBPK tablet Take as directed for 6 days.--Take 6 on day 1, 5 on day 2, 4 on day 3, then 3 tablets on day 4, then 2 tablets on day 5, then 1 on day 6. 21 tablet 0  . promethazine-codeine (PHENERGAN WITH CODEINE) 6.25-10 MG/5ML syrup Take 1-2 teaspoons every 4-6 hours as needed for cough. May cause drowsiness. 120 mL 0  . QVAR 80 MCG/ACT inhaler INHALE 1 PUFF INTO THE LUNGS TWICE A DAY 3 g 2  . triamcinolone cream (KENALOG) 0.1 % Apply 2 times a day to body. Never to the face.    Marland Kitchen amoxicillin (AMOXIL) 250 MG capsule Take 2 caps by mouth every 8 hours 60 capsule 0  . cefdinir (OMNICEF) 250 MG/5ML  suspension Take 6 mLs (300 mg total) by mouth 2 (two) times daily. For 10 days 120 mL 0  . fluticasone (FLONASE) 50 MCG/ACT nasal spray 1 or 2 sprays each nostril twice a day 16 g 0  . DIOVAN 160 MG tablet Take 1 tablet (160 mg total) by mouth daily. (Patient not taking: Reported on 09/10/2015) 90 tablet 1   No facility-administered medications prior to visit.     ROS Review of Systems  Constitutional: Negative for appetite change, fatigue and unexpected weight change.  HENT: Negative for congestion, nosebleeds, sneezing, sore throat and trouble swallowing.   Eyes: Negative for itching and visual disturbance.  Respiratory: Negative for cough.   Cardiovascular: Negative for chest pain, palpitations and leg swelling.  Gastrointestinal: Negative for abdominal distention, blood in stool, diarrhea and nausea.  Genitourinary: Negative for frequency and hematuria.  Musculoskeletal: Negative for back pain, gait problem, joint swelling and neck pain.  Skin: Negative for rash.  Neurological: Negative for dizziness, tremors, speech difficulty and weakness.  Psychiatric/Behavioral: Negative for agitation, dysphoric mood and sleep disturbance. The patient is not nervous/anxious.     Objective:  BP (!) 140/94   Pulse 89   Ht 6' (1.829 m)   Wt 203 lb (92.1 kg)   SpO2 96%  BMI 27.53 kg/m   BP Readings from Last 3 Encounters:  09/10/15 (!) 140/94  06/06/15 130/76  05/28/15 130/85    Wt Readings from Last 3 Encounters:  09/10/15 203 lb (92.1 kg)  05/28/15 207 lb (93.9 kg)  12/14/14 207 lb (93.9 kg)    Physical Exam  Constitutional: He is oriented to person, place, and time. He appears well-developed. No distress.  NAD  HENT:  Mouth/Throat: Oropharynx is clear and moist.  Eyes: Conjunctivae are normal. Pupils are equal, round, and reactive to light.  Neck: Normal range of motion. No JVD present. No thyromegaly present.  Cardiovascular: Normal rate, regular rhythm, normal heart sounds  and intact distal pulses.  Exam reveals no gallop and no friction rub.   No murmur heard. Pulmonary/Chest: Effort normal and breath sounds normal. No respiratory distress. He has no wheezes. He has no rales. He exhibits no tenderness.  Abdominal: Soft. Bowel sounds are normal. He exhibits no distension and no mass. There is no tenderness. There is no rebound and no guarding.  Musculoskeletal: Normal range of motion. He exhibits no edema or tenderness.  Lymphadenopathy:    He has no cervical adenopathy.  Neurological: He is alert and oriented to person, place, and time. He has normal reflexes. No cranial nerve deficit. He exhibits normal muscle tone. He displays a negative Romberg sign. Coordination and gait normal.  Skin: Skin is warm and dry. No rash noted.  Psychiatric: He has a normal mood and affect. His behavior is normal. Judgment and thought content normal.  Pt declined rectal exam  Lab Results  Component Value Date   WBC 6.6 09/04/2015   HGB 15.0 09/04/2015   HCT 44.4 09/04/2015   PLT 195.0 09/04/2015   GLUCOSE 103 (H) 09/04/2015   CHOL 211 (H) 09/04/2015   TRIG (H) 09/04/2015    646.0 Triglyceride is over 400; calculations on Lipids are invalid.   HDL 42.00 09/04/2015   LDLDIRECT 87.0 09/04/2015   ALT 51 09/04/2015   AST 31 09/04/2015   NA 139 09/04/2015   K 3.8 09/04/2015   CL 101 09/04/2015   CREATININE 0.95 09/04/2015   BUN 14 09/04/2015   CO2 29 09/04/2015   TSH 3.10 09/04/2015   PSA 0.49 09/04/2015    No results found.  Assessment & Plan:   There are no diagnoses linked to this encounter. I have discontinued [name] DIOVAN, amoxicillin, and cefdinir. I am also having him maintain his azelastine, QVAR, albuterol, Colesevelam HCl, triamcinolone cream, clonazePAM, pramipexole, promethazine-codeine, fluticasone, predniSONE, DIOVAN HCT, escitalopram, and pantoprazole.  No orders of the defined types were placed in this encounter.    Follow-up: No  Follow-up on file.  Walker Kehr, MD

## 2015-09-18 ENCOUNTER — Other Ambulatory Visit: Payer: Self-pay | Admitting: Internal Medicine

## 2015-10-29 ENCOUNTER — Other Ambulatory Visit: Payer: Self-pay | Admitting: Internal Medicine

## 2015-11-20 ENCOUNTER — Encounter: Payer: Self-pay | Admitting: Internal Medicine

## 2015-11-21 MED ORDER — CLONAZEPAM 0.5 MG PO TABS: 0.5000 mg | ORAL_TABLET | Freq: Every evening | ORAL | 2 refills | Status: DC | PRN

## 2015-12-04 ENCOUNTER — Encounter: Payer: Self-pay | Admitting: Internal Medicine

## 2015-12-12 ENCOUNTER — Ambulatory Visit: Payer: BLUE CROSS/BLUE SHIELD | Admitting: Internal Medicine

## 2015-12-16 ENCOUNTER — Other Ambulatory Visit: Payer: Self-pay | Admitting: Internal Medicine

## 2015-12-16 ENCOUNTER — Encounter: Payer: Self-pay | Admitting: Internal Medicine

## 2015-12-16 MED ORDER — OLMESARTAN MEDOXOMIL-HCTZ 20-12.5 MG PO TABS: 1.0000 | ORAL_TABLET | Freq: Every day | ORAL | 3 refills | Status: DC

## 2016-01-16 ENCOUNTER — Ambulatory Visit: Payer: BLUE CROSS/BLUE SHIELD | Admitting: Internal Medicine

## 2016-03-04 DIAGNOSIS — Z01818 Encounter for other preprocedural examination: Secondary | ICD-10-CM | POA: Diagnosis not present

## 2016-03-04 DIAGNOSIS — M87051 Idiopathic aseptic necrosis of right femur: Secondary | ICD-10-CM | POA: Diagnosis not present

## 2016-03-04 DIAGNOSIS — Z96642 Presence of left artificial hip joint: Secondary | ICD-10-CM | POA: Diagnosis not present

## 2016-03-16 DIAGNOSIS — Z7982 Long term (current) use of aspirin: Secondary | ICD-10-CM | POA: Diagnosis not present

## 2016-03-16 DIAGNOSIS — E781 Pure hyperglyceridemia: Secondary | ICD-10-CM | POA: Diagnosis not present

## 2016-03-16 DIAGNOSIS — M6530 Trigger finger, unspecified finger: Secondary | ICD-10-CM | POA: Insufficient documentation

## 2016-03-16 DIAGNOSIS — M65332 Trigger finger, left middle finger: Secondary | ICD-10-CM | POA: Diagnosis not present

## 2016-03-16 DIAGNOSIS — I10 Essential (primary) hypertension: Secondary | ICD-10-CM | POA: Diagnosis not present

## 2016-03-16 DIAGNOSIS — Z888 Allergy status to other drugs, medicaments and biological substances status: Secondary | ICD-10-CM | POA: Diagnosis not present

## 2016-03-16 DIAGNOSIS — Z79899 Other long term (current) drug therapy: Secondary | ICD-10-CM | POA: Diagnosis not present

## 2016-03-16 DIAGNOSIS — J45909 Unspecified asthma, uncomplicated: Secondary | ICD-10-CM | POA: Diagnosis not present

## 2016-05-13 ENCOUNTER — Other Ambulatory Visit: Payer: Self-pay | Admitting: Internal Medicine

## 2016-05-13 ENCOUNTER — Encounter: Payer: Self-pay | Admitting: Internal Medicine

## 2016-05-13 MED ORDER — BECLOMETHASONE DIPROPIONATE 80 MCG/ACT IN AERS: INHALATION_SPRAY | RESPIRATORY_TRACT | 3 refills | Status: DC

## 2016-05-13 NOTE — Telephone Encounter (Signed)
Patient has not had medication filled since 2015, please advise

## 2016-07-07 DIAGNOSIS — M87051 Idiopathic aseptic necrosis of right femur: Secondary | ICD-10-CM | POA: Diagnosis not present

## 2016-07-07 DIAGNOSIS — Z96642 Presence of left artificial hip joint: Secondary | ICD-10-CM | POA: Diagnosis not present

## 2016-07-07 DIAGNOSIS — M879 Osteonecrosis, unspecified: Secondary | ICD-10-CM | POA: Diagnosis not present

## 2016-07-07 DIAGNOSIS — M1611 Unilateral primary osteoarthritis, right hip: Secondary | ICD-10-CM | POA: Diagnosis not present

## 2016-07-07 DIAGNOSIS — Z471 Aftercare following joint replacement surgery: Secondary | ICD-10-CM | POA: Diagnosis not present

## 2016-07-29 ENCOUNTER — Ambulatory Visit: Payer: Self-pay | Admitting: Internal Medicine

## 2016-08-09 ENCOUNTER — Other Ambulatory Visit: Payer: Self-pay | Admitting: Internal Medicine

## 2016-08-12 ENCOUNTER — Encounter: Payer: Self-pay | Admitting: Internal Medicine

## 2016-08-12 MED ORDER — FENOFIBRATE 160 MG PO TABS: 160.0000 mg | ORAL_TABLET | Freq: Every day | ORAL | 0 refills | Status: DC

## 2016-08-13 ENCOUNTER — Other Ambulatory Visit (INDEPENDENT_AMBULATORY_CARE_PROVIDER_SITE_OTHER): Payer: BLUE CROSS/BLUE SHIELD

## 2016-08-13 ENCOUNTER — Telehealth: Payer: Self-pay

## 2016-08-13 ENCOUNTER — Other Ambulatory Visit: Payer: BLUE CROSS/BLUE SHIELD

## 2016-08-13 DIAGNOSIS — E785 Hyperlipidemia, unspecified: Secondary | ICD-10-CM

## 2016-08-13 LAB — HEPATIC FUNCTION PANEL
ALBUMIN: 4.4 g/dL (ref 3.5–5.2)
ALK PHOS: 33 U/L — AB (ref 39–117)
ALT: 70 U/L — AB (ref 0–53)
AST: 42 U/L — ABNORMAL HIGH (ref 0–37)
Bilirubin, Direct: 0.2 mg/dL (ref 0.0–0.3)
TOTAL PROTEIN: 7 g/dL (ref 6.0–8.3)
Total Bilirubin: 0.6 mg/dL (ref 0.2–1.2)

## 2016-08-13 LAB — LIPID PANEL
Cholesterol: 207 mg/dL — ABNORMAL HIGH (ref 0–200)
HDL: 47.6 mg/dL (ref >39.00)
LDL Cholesterol: 128 mg/dL — ABNORMAL HIGH (ref 0–99)
NONHDL: 159.58
Total CHOL/HDL Ratio: 4
Triglycerides: 157 mg/dL — ABNORMAL HIGH (ref 0.0–149.0)
VLDL: 31.4 mg/dL (ref 0.0–40.0)

## 2016-08-13 NOTE — Telephone Encounter (Signed)
error 

## 2016-08-20 ENCOUNTER — Ambulatory Visit: Payer: Self-pay | Admitting: Internal Medicine

## 2016-08-27 ENCOUNTER — Encounter: Payer: Self-pay | Admitting: Internal Medicine

## 2016-09-01 NOTE — Telephone Encounter (Signed)
RX was sent in

## 2016-09-02 ENCOUNTER — Ambulatory Visit (INDEPENDENT_AMBULATORY_CARE_PROVIDER_SITE_OTHER): Payer: BLUE CROSS/BLUE SHIELD | Admitting: Internal Medicine

## 2016-09-02 ENCOUNTER — Encounter: Payer: Self-pay | Admitting: Internal Medicine

## 2016-09-02 DIAGNOSIS — E785 Hyperlipidemia, unspecified: Secondary | ICD-10-CM

## 2016-09-02 DIAGNOSIS — M79672 Pain in left foot: Secondary | ICD-10-CM

## 2016-09-02 DIAGNOSIS — I10 Essential (primary) hypertension: Secondary | ICD-10-CM | POA: Diagnosis not present

## 2016-09-02 MED ORDER — CLONAZEPAM 0.5 MG PO TABS: 0.5000 mg | ORAL_TABLET | Freq: Every evening | ORAL | 2 refills | Status: DC | PRN

## 2016-09-02 MED ORDER — BECLOMETHASONE DIPROPIONATE 80 MCG/ACT IN AERS: INHALATION_SPRAY | RESPIRATORY_TRACT | 3 refills | Status: DC

## 2016-09-02 MED ORDER — FENOFIBRATE 160 MG PO TABS: 160.0000 mg | ORAL_TABLET | Freq: Every day | ORAL | 11 refills | Status: DC

## 2016-09-02 MED ORDER — PANTOPRAZOLE SODIUM 40 MG PO TBEC: 40.0000 mg | DELAYED_RELEASE_TABLET | Freq: Every day | ORAL | 3 refills | Status: DC

## 2016-09-02 NOTE — Assessment & Plan Note (Signed)
Cont w/meds 

## 2016-09-02 NOTE — Assessment & Plan Note (Signed)
Fenofibrate Reduce ETOH to 1/d. Stop adding coke. Repeat labs

## 2016-09-02 NOTE — Assessment & Plan Note (Signed)
Wider shoe box shoe; insert for Morton's neuroma Sports Med

## 2016-09-02 NOTE — Patient Instructions (Addendum)
Wider shoe box shoe; insert for Morton's neuroma  Reduce drinks to 1/day. Stop adding coke.   Morton Neuroma  Morton neuralgia is a type of foot pain in the area closest to your toes. This area is sometimes called the ball of your foot. Morton neuralgia occurs when a branch of a nerve in your foot (digital nerve) becomes compressed. When this happens over a long period of time, the nerve can thicken (neuroma) and cause pain. This usually occurs between the third and fourth toe. Morton neuralgia can come and go but may get worse over time. What are the causes? Your digital nerve can become compressed and stretched at a point where it passes under a thick band of tissue that connects your toes (intermetatarsal ligament). Morton neuralgia can be caused by mild repetitive damage in this area. This type of damage can result from:  Activities such as running or jumping.  Wearing shoes that are too tight.  What increases the risk? You may be at risk for Morton neuralgia if you:  Are male.  Wear high heels.  Wear shoes that are narrow or tight.  Participate in activities that stretch your toes. These include: ? Running. ? [place]. ? Long-distance walking.  What are the signs or symptoms? The first symptom of Morton neuralgia is pain that spreads from the ball of your foot to your toes. It may feel like you are walking on a marble. Pain usually gets worse with walking and goes away at night. Other symptoms may include numbness and cramping of your toes. How is this diagnosed? Your health care provider will do a physical exam. When doing the exam, your health care provider may:  Squeeze your foot just behind your toe.  Ask you to move your toes to check for pain.  You may also have tests on your foot to confirm the diagnosis. These may include:  An X-ray.  An MRI.  How is this treated? Treatment for Morton neuralgia may be as simple as changing the kind of shoes you wear. Other  treatments may include:  Wearing a supportive pad (orthosis) under the front of your foot. This lifts your toe bones and takes pressure off the nerve.  Getting injections of numbing medicine and anti-inflammatory medicine (steroid) in the nerve.  Having surgery to remove part of the thickened nerve.  Follow these instructions at home:  Take medicine only as directed by your health care provider.  Wear soft-soled shoes with a wide toe area.  Stop activities that may be causing pain.  Elevate your foot when resting.  Massage your foot.  Apply ice to the injured area: ? Put ice in a plastic bag. ? Place a towel between your skin and the bag. ? Leave the ice on for 20 minutes, 2-3 times a day.  Keep all follow-up visits as directed by your health care provider. This is important. Contact a health care provider if:  Home care instructions are not helping you get better.  Your symptoms change or get worse. This information is not intended to replace advice given to you by your health care provider. Make sure you discuss any questions you have with your health care provider. Document Released: 04/19/2000 Document Revised: 06/19/2015 Document Reviewed: 03/14/2013 Elsevier Interactive Patient Education  [employer].

## 2016-09-02 NOTE — Progress Notes (Signed)
Subjective:  Patient ID: Darrell Santos, male    DOB: 1965/07/03  Age: 51 y.o. MRN: [id number]  CC: No chief complaint on file.   HPI Darrell Santos presents for a dyslipidemia, anxiety, HTN f/u  Outpatient Medications Prior to Visit  Medication Sig Dispense Refill  . albuterol (PROAIR HFA) 108 (90 BASE) MCG/ACT inhaler Inhale 2 puffs into the lungs every 4 (four) hours as needed for wheezing or shortness of breath. 1 Inhaler 0  . azelastine (OPTIVAR) 0.05 % ophthalmic solution Place 1 drop into both eyes 2 (two) times daily. 6 mL 12  . beclomethasone (QVAR) 80 MCG/ACT inhaler INHALE 1 PUFF INTO THE LUNGS TWICE A DAY 3 Inhaler 3  . clonazePAM (KLONOPIN) 0.5 MG tablet Take 1 tablet (0.5 mg total) by mouth at bedtime as needed. 30 tablet 2  . escitalopram (LEXAPRO) 10 MG tablet Take 1 tablet (10 mg total) by mouth daily. 90 tablet 3  . fenofibrate 160 MG tablet Take 1 tablet (160 mg total) by mouth daily. Keep appt for future refills 30 tablet 0  . olmesartan-hydrochlorothiazide (BENICAR HCT) 20-12.5 MG tablet Take 1 tablet by mouth daily. 90 tablet 3  . pantoprazole (PROTONIX) 40 MG tablet Take 1 tablet (40 mg total) by mouth daily. 90 tablet 3  . pramipexole (MIRAPEX) 0.25 MG tablet Take 1-2 tablets (0.25-0.5 mg total) by mouth at bedtime. 180 tablet 3  . fluticasone (FLONASE) 50 MCG/ACT nasal spray 1 or 2 sprays each nostril twice a day 16 g 0  . predniSONE (STERAPRED UNI-PAK 21 TAB) 10 MG (21) TBPK tablet Take as directed for 6 days.--Take 6 on day 1, 5 on day 2, 4 on day 3, then 3 tablets on day 4, then 2 tablets on day 5, then 1 on day 6. 21 tablet 0  . triamcinolone cream (KENALOG) 0.1 % Apply 2 times a day to body. Never to the face.     No facility-administered medications prior to visit.     ROS Review of Systems  Constitutional: Negative for appetite change, fatigue and unexpected weight change.  HENT: Negative for congestion, nosebleeds, sneezing, sore throat and  trouble swallowing.   Eyes: Negative for itching and visual disturbance.  Respiratory: Negative for cough.   Cardiovascular: Negative for chest pain, palpitations and leg swelling.  Gastrointestinal: Negative for abdominal distention, blood in stool, diarrhea and nausea.  Genitourinary: Negative for frequency and hematuria.  Musculoskeletal: Positive for arthralgias. Negative for back pain, gait problem, joint swelling and neck pain.  Skin: Negative for rash.  Neurological: Negative for dizziness, tremors, speech difficulty and weakness.  Psychiatric/Behavioral: Negative for agitation, dysphoric mood and sleep disturbance. The patient is not nervous/anxious.     Objective:  BP 118/68 (BP Location: Left Arm, Patient Position: Sitting, Cuff Size: Large)   Pulse 73   Temp 98.6 F (37 C) (Oral)   Ht 6' (1.829 m)   Wt 207 lb (93.9 kg)   SpO2 99%   BMI 28.07 kg/m   BP Readings from Last 3 Encounters:  09/02/16 118/68  09/10/15 (!) 140/94  06/06/15 130/76    Wt Readings from Last 3 Encounters:  09/02/16 207 lb (93.9 kg)  09/10/15 203 lb (92.1 kg)  05/28/15 207 lb (93.9 kg)    Physical Exam  Constitutional: He is oriented to person, place, and time. He appears well-developed. No distress.  NAD  HENT:  Mouth/Throat: Oropharynx is clear and moist.  Eyes: Pupils are equal, round, and reactive to light.  Conjunctivae are normal.  Neck: Normal range of motion. No JVD present. No thyromegaly present.  Cardiovascular: Normal rate, regular rhythm, normal heart sounds and intact distal pulses.  Exam reveals no gallop and no friction rub.   No murmur heard. Pulmonary/Chest: Effort normal and breath sounds normal. No respiratory distress. He has no wheezes. He has no rales. He exhibits no tenderness.  Abdominal: Soft. Bowel sounds are normal. He exhibits no distension and no mass. There is no tenderness. There is no rebound and no guarding.  Musculoskeletal: Normal range of motion. He  exhibits no edema or tenderness.  Lymphadenopathy:    He has no cervical adenopathy.  Neurological: He is alert and oriented to person, place, and time. He has normal reflexes. No cranial nerve deficit. He exhibits normal muscle tone. He displays a negative Romberg sign. Coordination and gait normal.  Skin: Skin is warm and dry. No rash noted.  Psychiatric: He has a normal mood and affect. His behavior is normal. Judgment and thought content normal.  L foot tender w/squeezing  Lab Results  Component Value Date   WBC 6.6 09/04/2015   HGB 15.0 09/04/2015   HCT 44.4 09/04/2015   PLT 195.0 09/04/2015   GLUCOSE 103 (H) 09/04/2015   CHOL 207 (H) 08/13/2016   TRIG 157.0 (H) 08/13/2016   HDL 47.60 08/13/2016   LDLDIRECT 87.0 09/04/2015   LDLCALC 128 (H) 08/13/2016   ALT 70 (H) 08/13/2016   AST 42 (H) 08/13/2016   NA 139 09/04/2015   K 3.8 09/04/2015   CL 101 09/04/2015   CREATININE 0.95 09/04/2015   BUN 14 09/04/2015   CO2 29 09/04/2015   TSH 3.10 09/04/2015   PSA 0.49 09/04/2015    No results found.  Assessment & Plan:   There are no diagnoses linked to this encounter. I have discontinued Mr. Snelgrove triamcinolone cream, fluticasone, and predniSONE. I am also having him maintain his azelastine, albuterol, pramipexole, pantoprazole, escitalopram, clonazePAM, olmesartan-hydrochlorothiazide, beclomethasone, and fenofibrate.  No orders of the defined types were placed in this encounter.    Follow-up: No Follow-up on file.  Walker Kehr, MD

## 2016-09-23 ENCOUNTER — Encounter: Payer: Self-pay | Admitting: Internal Medicine

## 2016-09-23 MED ORDER — FENOFIBRATE 160 MG PO TABS: 160.0000 mg | ORAL_TABLET | Freq: Every day | ORAL | 3 refills | Status: DC

## 2016-11-04 ENCOUNTER — Other Ambulatory Visit: Payer: Self-pay | Admitting: Internal Medicine

## 2016-11-25 ENCOUNTER — Other Ambulatory Visit: Payer: Self-pay | Admitting: Internal Medicine

## 2016-11-29 ENCOUNTER — Encounter: Payer: Self-pay | Admitting: Internal Medicine

## 2016-12-03 ENCOUNTER — Ambulatory Visit: Payer: BLUE CROSS/BLUE SHIELD | Admitting: Internal Medicine

## 2017-02-17 ENCOUNTER — Encounter: Payer: Self-pay | Admitting: *Deleted

## 2017-02-17 ENCOUNTER — Emergency Department
Admission: EM | Admit: 2017-02-17 | Discharge: 2017-02-17 | Disposition: A | Discharge status: Home / Self Care | Payer: BLUE CROSS/BLUE SHIELD | Source: Home / Self Care | Attending: Family Medicine | Admitting: Family Medicine

## 2017-02-17 ENCOUNTER — Other Ambulatory Visit: Payer: Self-pay

## 2017-02-17 DIAGNOSIS — R110 Nausea: Secondary | ICD-10-CM

## 2017-02-17 DIAGNOSIS — R197 Diarrhea, unspecified: Secondary | ICD-10-CM

## 2017-02-17 LAB — POCT INFLUENZA A/B
Influenza A, POC: NEGATIVE
Influenza B, POC: NEGATIVE

## 2017-02-17 NOTE — Discharge Instructions (Signed)
Begin clear liquids (Pedialyte while having diarrhea) until improved, then advance to a BRAT diet (Bananas, Rice, Applesauce, Toast).  Then gradually resume a regular diet when tolerated.  Avoid milk products until well.  When stools become more formed, may take Imodium (loperamide) once or twice daily to decrease stool frequency.  °If symptoms become significantly worse during the night or over the weekend, proceed to the local emergency room.  °

## 2017-02-17 NOTE — ED Triage Notes (Signed)
Patient c/o 1 1/2 days of body aches, low grade fever and HA. Received flu vac. Co-workers with flu.

## 2017-02-17 NOTE — ED Provider Notes (Signed)
[name]    CSN: [id number] Arrival date & time: 02/17/17  1047     History   Chief Complaint Chief Complaint  Patient presents with  . Generalized Body Aches    HPI Darrell Santos is a 52 y.o. male.   At 4am yesterday patient developed myalgias, fatigue, and headache, followed later by chills and low grade fever to 99.1.  He had a brief episode of nausea without vomiting yesterday, and developed watery diarrhea last night.  He has had two episodes of diarrhea today.  No sore throat, nasal congestion, or cough.  He has been exposed to co-workers who have the flu.   The history is provided by the patient.    Past Medical History:  Diagnosis Date  . Allergic rhinitis   . Anxiety   . Asthma   . GERD (gastroesophageal reflux disease)   . HTN (hypertension)   . Rosacea     Patient Active Problem List   Diagnosis Date Noted  . Foot pain, left 09/02/2016  . Dyslipidemia 09/10/2015  . Restless leg syndrome 09/19/2014  . Rash and nonspecific skin eruption 09/19/2014  . Warts 09/27/2012  . Well adult exam 08/16/2012  . Actinic keratoses 08/16/2012  . Food allergy 08/15/2012  . 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 Diarrhea 06/30/2010  . CHOLELITHIASIS 01/08/2010  . RUQ PAIN 12/29/2009  . ACTINIC KERATOSIS 07/31/2007  . Neoplasm of uncertain behavior of skin 06/21/2007  . ABNORMAL GLUCOSE NEC 06/21/2007  . ABNORMAL LIVER FUNCTION TESTS 06/21/2007  . Anxiety state 11/25/2006  . Essential hypertension 11/25/2006  . ALLERGIC RHINITIS 11/25/2006  . Asthma 11/25/2006  . GERD 11/25/2006    Past Surgical History:  Procedure Laterality Date  . CHOLECYSTECTOMY    . NASAL SINUS SURGERY    . TOTAL HIP ARTHROPLASTY         Home Medications    Prior to Admission medications   Medication Sig Start Date End Date Taking? Authorizing Provider  albuterol (PROAIR HFA) 108 (90 BASE) MCG/ACT inhaler Inhale 2 puffs into the lungs every 4 (four) hours  as needed for wheezing or shortness of breath. 11/25/13  Yes Jacqulyn Cane, MD  beclomethasone (QVAR) 80 MCG/ACT inhaler INHALE 1 PUFF INTO THE LUNGS TWICE A DAY 09/02/16  Yes Plotnikov, Evie Lacks, MD  escitalopram (LEXAPRO) 10 MG tablet TAKE 1 TABLET DAILY 11/29/16  Yes Plotnikov, Evie Lacks, MD  esomeprazole (NEXIUM) 20 MG packet Take 20 mg by mouth daily before breakfast.   Yes [provider]  azelastine (OPTIVAR) 0.05 % ophthalmic solution Place 1 drop into both eyes 2 (two) times daily. 11/26/11   Plotnikov, Evie Lacks, MD  clonazePAM (KLONOPIN) 0.5 MG tablet Take 1 tablet (0.5 mg total) by mouth at bedtime as needed. 09/02/16   Plotnikov, Evie Lacks, MD  fenofibrate 160 MG tablet Take 1 tablet (160 mg total) by mouth daily. Keep appt for future refills 09/23/16   Plotnikov, Evie Lacks, MD  olmesartan-hydrochlorothiazide (BENICAR HCT) 20-12.5 MG tablet TAKE 1 TABLET DAILY 11/04/16   Plotnikov, Evie Lacks, MD  pantoprazole (PROTONIX) 40 MG tablet Take 1 tablet (40 mg total) by mouth daily. 09/02/16   Plotnikov, Evie Lacks, MD  pramipexole (MIRAPEX) 0.25 MG tablet Take 1-2 tablets (0.25-0.5 mg total) by mouth at bedtime. 09/19/14   Plotnikov, Evie Lacks, MD    Family History Family History  Problem Relation Age of Onset  . Hypertension Other   . Hypertension Other   . Diabetes Other  1st degree relative     Social History Social History   Tobacco Use  . Smoking status: Never Smoker  . Smokeless tobacco: Never Used  Substance Use Topics  . Alcohol use: Yes    Alcohol/week: 7.0 oz    Types: 14 Standard drinks or equivalent per week    Comment: 2/day  . Drug use: No     Allergies   Midazolam; Molds & smuts; Other; and Pollen extract   Review of Systems Review of Systems No sore throat No cough No pleuritic pain No wheezing No nasal congestion No post-nasal drainage No sinus pain/pressure No itchy/red eyes No earache No hemoptysis No SOB + fever, + chills +  nausea No vomiting No abdominal pain + diarrhea No urinary symptoms No skin rash + fatigue + myalgias + headache Used OTC meds without relief   Physical Exam Triage Vital Signs ED Triage Vitals  Enc Vitals Group     BP 02/17/17 1111 113/73     Pulse Rate 02/17/17 1111 85     Resp 02/17/17 1111 16     Temp 02/17/17 1111 98.1 F (36.7 C)     Temp Source 02/17/17 1111 Oral     SpO2 02/17/17 1111 96 %     Weight 02/17/17 1112 212 lb (96.2 kg)     Height --      Head Circumference --      Peak Flow --      Pain Score 02/17/17 1112 0     Pain Loc --      Pain Edu? --      Excl. in [place]? --    No data found.  Updated Vital Signs BP 113/73 (BP Location: Right Arm)   Pulse 85   Temp 98.1 F (36.7 C) (Oral)   Resp 16   Wt 212 lb (96.2 kg)   SpO2 96%   BMI 28.75 kg/m   Visual Acuity Right Eye Distance:   Left Eye Distance:   Bilateral Distance:    Right Eye Near:   Left Eye Near:    Bilateral Near:     Physical Exam Nursing notes and Vital Signs reviewed. Appearance:  Patient appears stated age, and in no acute distress Eyes:  Pupils are equal, round, and reactive to light and accomodation.  Extraocular movement is intact.  Conjunctivae are not inflamed  Ears:  Canals normal.  Tympanic membranes normal.  Nose:  Normal turbinates.  No sinus tenderness.  Pharynx:  Normal; moist mucous membranes  Neck:  Supple. No adenopathy. Lungs:  Clear to auscultation.  Breath sounds are equal.  Moving air well. Heart:  Regular rate and rhythm without murmurs, rubs, or gallops.  Abdomen:  Nontender without masses or hepatosplenomegaly.  Bowel sounds are present.  No CVA or flank tenderness.  Extremities:  No edema.  Skin:  No rash present.    UC Treatments / Results  Labs (all labs ordered are listed, but only abnormal results are displayed) Labs Reviewed  POCT INFLUENZA A/B negative    EKG  EKG Interpretation None       Radiology No results  found.  Procedures Procedures (including critical care time)  Medications Ordered in UC Medications - No data to display   Initial Impression / Assessment and Plan / UC Course  I have reviewed the triage vital signs and the nursing notes.  Pertinent labs & imaging results that were available during my care of the patient were reviewed by me and considered in  my medical decision making (see chart for details).    Suspect mild viral gastroenteritis.  Treat symptomatically for now  Begin clear liquids (Pedialyte while having diarrhea) until improved, then advance to a [employer] (Bananas, Rice, Applesauce, Toast).  Then gradually resume a regular diet when tolerated.  Avoid milk products until well.   When stools become more formed, may take Imodium (loperamide) once or twice daily to decrease stool frequency.  If symptoms become significantly worse during the night or over the weekend, proceed to the local emergency room.  Followup with Family Doctor if not improved in about 5 days.    Final Clinical Impressions(s) / UC Diagnoses   Final diagnoses:  Nausea without vomiting  Diarrhea, unspecified type    ED Discharge Orders    None          Kandra Nicolas, MD 02/20/17 1207

## 2017-02-24 ENCOUNTER — Encounter: Payer: Self-pay | Admitting: Internal Medicine

## 2017-02-25 ENCOUNTER — Ambulatory Visit (INDEPENDENT_AMBULATORY_CARE_PROVIDER_SITE_OTHER): Payer: BLUE CROSS/BLUE SHIELD | Admitting: Internal Medicine

## 2017-02-25 ENCOUNTER — Other Ambulatory Visit: Payer: Self-pay | Admitting: Internal Medicine

## 2017-02-25 ENCOUNTER — Encounter: Payer: Self-pay | Admitting: Internal Medicine

## 2017-02-25 VITALS — BP 132/78 | HR 83 | Temp 98.6°F | Ht 72.0 in | Wt 214.0 lb

## 2017-02-25 DIAGNOSIS — Z1211 Encounter for screening for malignant neoplasm of colon: Secondary | ICD-10-CM | POA: Diagnosis not present

## 2017-02-25 DIAGNOSIS — I10 Essential (primary) hypertension: Secondary | ICD-10-CM

## 2017-02-25 DIAGNOSIS — Z0000 Encounter for general adult medical examination without abnormal findings: Secondary | ICD-10-CM | POA: Diagnosis not present

## 2017-02-25 DIAGNOSIS — Z23 Encounter for immunization: Secondary | ICD-10-CM

## 2017-02-25 DIAGNOSIS — E785 Hyperlipidemia, unspecified: Secondary | ICD-10-CM

## 2017-02-25 DIAGNOSIS — F411 Generalized anxiety disorder: Secondary | ICD-10-CM

## 2017-02-25 DIAGNOSIS — R197 Diarrhea, unspecified: Secondary | ICD-10-CM

## 2017-02-25 MED ORDER — BECLOMETHASONE DIPROPIONATE 80 MCG/ACT IN AERS: INHALATION_SPRAY | RESPIRATORY_TRACT | 3 refills | Status: DC

## 2017-02-25 MED ORDER — ALBUTEROL SULFATE HFA 108 (90 BASE) MCG/ACT IN AERS: 2.0000 | INHALATION_SPRAY | RESPIRATORY_TRACT | 0 refills | Status: DC | PRN

## 2017-02-25 MED ORDER — FENOFIBRATE 160 MG PO TABS: 160.0000 mg | ORAL_TABLET | Freq: Every day | ORAL | 3 refills | Status: DC

## 2017-02-25 MED ORDER — PANTOPRAZOLE SODIUM 40 MG PO TBEC: 40.0000 mg | DELAYED_RELEASE_TABLET | Freq: Every day | ORAL | 3 refills | Status: DC

## 2017-02-25 MED ORDER — TRIAMCINOLONE ACETONIDE 0.1 % EX OINT: 1.0000 "application " | TOPICAL_OINTMENT | Freq: Two times a day (BID) | CUTANEOUS | 1 refills | Status: DC

## 2017-02-25 MED ORDER — ESCITALOPRAM OXALATE 10 MG PO TABS: 10.0000 mg | ORAL_TABLET | Freq: Every day | ORAL | 3 refills | Status: DC

## 2017-02-25 MED ORDER — CLONAZEPAM 0.5 MG PO TABS
0.5000 mg | ORAL_TABLET | Freq: Every evening | ORAL | 2 refills | Status: DC | PRN
Start: 2017-02-25 — End: 2017-09-01

## 2017-02-25 MED ORDER — PRAMIPEXOLE DIHYDROCHLORIDE 0.25 MG PO TABS: 0.2500 mg | ORAL_TABLET | Freq: Every day | ORAL | 3 refills | Status: DC

## 2017-02-25 MED ORDER — OLMESARTAN MEDOXOMIL-HCTZ 20-12.5 MG PO TABS: 1.0000 | ORAL_TABLET | Freq: Every day | ORAL | 3 refills | Status: DC

## 2017-02-25 NOTE — Patient Instructions (Signed)
[facility]

## 2017-02-25 NOTE — Progress Notes (Signed)
Subjective:  Patient ID: Darrell Santos, male    DOB: [birth year]  Age: 52 y.o. MRN: [id number]  CC: No chief complaint on file.   HPI Darrell Santos presents for a well exam   Outpatient Medications Prior to Visit  Medication Sig Dispense Refill  . albuterol (PROAIR HFA) 108 (90 BASE) MCG/ACT inhaler Inhale 2 puffs into the lungs every 4 (four) hours as needed for wheezing or shortness of breath. 1 Inhaler 0  . beclomethasone (QVAR) 80 MCG/ACT inhaler INHALE 1 PUFF INTO THE LUNGS TWICE A DAY 3 Inhaler 3  . clonazePAM (KLONOPIN) 0.5 MG tablet Take 1 tablet (0.5 mg total) by mouth at bedtime as needed. 30 tablet 2  . escitalopram (LEXAPRO) 10 MG tablet TAKE 1 TABLET DAILY 90 tablet 1  . fenofibrate 160 MG tablet Take 1 tablet (160 mg total) by mouth daily. Keep appt for future refills 90 tablet 3  . olmesartan-hydrochlorothiazide (BENICAR HCT) 20-12.5 MG tablet TAKE 1 TABLET DAILY 90 tablet 3  . pantoprazole (PROTONIX) 40 MG tablet Take 1 tablet (40 mg total) by mouth daily. 90 tablet 3  . pramipexole (MIRAPEX) 0.25 MG tablet Take 1-2 tablets (0.25-0.5 mg total) by mouth at bedtime. 180 tablet 3  . azelastine (OPTIVAR) 0.05 % ophthalmic solution Place 1 drop into both eyes 2 (two) times daily. 6 mL 12  . esomeprazole (NEXIUM) 20 MG packet Take 20 mg by mouth daily before breakfast.     No facility-administered medications prior to visit.     ROS Review of Systems  Constitutional: Negative for appetite change, fatigue and unexpected weight change.  HENT: Negative for congestion, nosebleeds, sneezing, sore throat and trouble swallowing.   Eyes: Negative for itching and visual disturbance.  Respiratory: Negative for cough.   Cardiovascular: Negative for chest pain, palpitations and leg swelling.  Gastrointestinal: Negative for abdominal distention, blood in stool, diarrhea and nausea.  Genitourinary: Negative for frequency and hematuria.  Musculoskeletal: Negative for back pain,  gait problem, joint swelling and neck pain.  Skin: Negative for rash.  Neurological: Negative for dizziness, tremors, speech difficulty and weakness.  Psychiatric/Behavioral: Negative for agitation, dysphoric mood and sleep disturbance. The patient is not nervous/anxious.     Objective:  BP 132/78 (BP Location: Left Arm, Patient Position: Sitting, Cuff Size: Large)   Pulse 83   Temp 98.6 F (37 C) (Oral)   Ht 6' (1.829 m)   Wt 214 lb (97.1 kg)   SpO2 98%   BMI 29.02 kg/m   BP Readings from Last 3 Encounters:  02/25/17 132/78  02/17/17 113/73  09/02/16 118/68    Wt Readings from Last 3 Encounters:  02/25/17 214 lb (97.1 kg)  02/17/17 212 lb (96.2 kg)  09/02/16 207 lb (93.9 kg)    Physical Exam  Constitutional: He is oriented to person, place, and time. He appears well-developed. No distress.  NAD  HENT:  Mouth/Throat: Oropharynx is clear and moist.  Eyes: Conjunctivae are normal. Pupils are equal, round, and reactive to light.  Neck: Normal range of motion. No JVD present. No thyromegaly present.  Cardiovascular: Normal rate, regular rhythm, normal heart sounds and intact distal pulses. Exam reveals no gallop and no friction rub.  No murmur heard. Pulmonary/Chest: Effort normal and breath sounds normal. No respiratory distress. He has no wheezes. He has no rales. He exhibits no tenderness.  Abdominal: Soft. Bowel sounds are normal. He exhibits no distension and no mass. There is no tenderness. There is no rebound and  no guarding.  Musculoskeletal: Normal range of motion. He exhibits no edema or tenderness.  Lymphadenopathy:    He has no cervical adenopathy.  Neurological: He is alert and oriented to person, place, and time. He has normal reflexes. No cranial nerve deficit. He exhibits normal muscle tone. He displays a negative Romberg sign. Coordination and gait normal.  Skin: Skin is warm and dry. No rash noted.  Psychiatric: He has a normal mood and affect. His behavior  is normal. Judgment and thought content normal.    Lab Results  Component Value Date   WBC 6.6 09/04/2015   HGB 15.0 09/04/2015   HCT 44.4 09/04/2015   PLT 195.0 09/04/2015   GLUCOSE 103 (H) 09/04/2015   CHOL 207 (H) 08/13/2016   TRIG 157.0 (H) 08/13/2016   HDL 47.60 08/13/2016   LDLDIRECT 87.0 09/04/2015   LDLCALC 128 (H) 08/13/2016   ALT 70 (H) 08/13/2016   AST 42 (H) 08/13/2016   NA 139 09/04/2015   K 3.8 09/04/2015   CL 101 09/04/2015   CREATININE 0.95 09/04/2015   BUN 14 09/04/2015   CO2 29 09/04/2015   TSH 3.10 09/04/2015   PSA 0.49 09/04/2015    No results found.  Assessment & Plan:   There are no diagnoses linked to this encounter. I have discontinued Darrell Santos. Darrell Santos's azelastine and esomeprazole. I am also having him maintain his albuterol, pramipexole, beclomethasone, clonazePAM, pantoprazole, fenofibrate, olmesartan-hydrochlorothiazide, and escitalopram.  No orders of the defined types were placed in this encounter.    Follow-up: No Follow-up on file.  Walker Kehr, MD

## 2017-02-25 NOTE — Assessment & Plan Note (Signed)
We discussed age appropriate health related issues, including available/recomended screening tests and vaccinations. We discussed a need for adhering to healthy diet and exercise. Labs were ordered to be later reviewed . All questions were answered.  Colonoscopy

## 2017-02-25 NOTE — Assessment & Plan Note (Signed)
On Azor

## 2017-03-08 ENCOUNTER — Other Ambulatory Visit: Payer: Self-pay

## 2017-03-08 ENCOUNTER — Emergency Department
Admission: EM | Admit: 2017-03-08 | Discharge: 2017-03-08 | Disposition: A | Discharge status: Home / Self Care | Payer: BLUE CROSS/BLUE SHIELD | Source: Home / Self Care | Attending: Family Medicine | Admitting: Family Medicine

## 2017-03-08 DIAGNOSIS — B9789 Other viral agents as the cause of diseases classified elsewhere: Secondary | ICD-10-CM

## 2017-03-08 DIAGNOSIS — M545 Low back pain, unspecified: Secondary | ICD-10-CM

## 2017-03-08 DIAGNOSIS — J069 Acute upper respiratory infection, unspecified: Secondary | ICD-10-CM | POA: Diagnosis not present

## 2017-03-08 MED ORDER — PREDNISONE 20 MG PO TABS: ORAL_TABLET | ORAL | 0 refills | Status: DC

## 2017-03-08 MED ORDER — AZITHROMYCIN 250 MG PO TABS: ORAL_TABLET | ORAL | 0 refills | Status: DC

## 2017-03-08 NOTE — ED Triage Notes (Signed)
Pt started last Thursday with nasal congestion, and sore throat.  Over the weekend became worse with chest congestion.  Last few days, has had lower left back pain.

## 2017-03-08 NOTE — Discharge Instructions (Signed)
Take plain guaifenesin (1200mg  extended release tabs such as Mucinex) twice daily, with plenty of water, for cough and congestion.  Get adequate rest.   May use Afrin nasal spray (or generic oxymetazoline) each morning for about 5 days and then discontinue.  Also recommend using saline nasal spray several times daily and saline nasal irrigation (AYR is a common brand).  Use Flonase nasal spray each morning after using Afrin nasal spray and saline nasal irrigation. Try warm salt water gargles for sore throat.  Stop all antihistamines for now, and other non-prescription cough/cold preparations. May take Tylenol as needed for lower back pain. Continue all inhalers as prescribed. May continue Robitussin DM at bedtime. Begin Azithromycin if not improving about one week or if persistent fever develops   Follow-up with family doctor if not improving about10 days.

## 2017-03-08 NOTE — ED Provider Notes (Signed)
[name]    CSN: [id number] Arrival date & time: 03/08/17  1221     History   Chief Complaint Chief Complaint  Patient presents with  . Back Pain  . Cough  . Sore Throat  . Nasal Congestion    HPI Darrell Santos is a 52 y.o. male.   About 6 days ago patient developed typical cold-like symptoms developing over several days, including mild sore throat, sinus congestion, fatigue, and cough.  No fevers, chills, and sweats.  He has asthma but denies increased wheezing or shortness of breath.  He has developed non-radiating left lower back ache.  No GU symptoms.   The history is provided by the patient.    Past Medical History:  Diagnosis Date  . Allergic rhinitis   . Anxiety   . Asthma   . GERD (gastroesophageal reflux disease)   . HTN (hypertension)   . Rosacea     Patient Active Problem List   Diagnosis Date Noted  . Foot pain, left 09/02/2016  . Dyslipidemia 09/10/2015  . Restless leg syndrome 09/19/2014  . Rash and nonspecific skin eruption 09/19/2014  . Warts 09/27/2012  . Well adult exam 08/16/2012  . Actinic keratoses 08/16/2012  . Food allergy 08/15/2012  . 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 Diarrhea 06/30/2010  . CHOLELITHIASIS 01/08/2010  . RUQ PAIN 12/29/2009  . ACTINIC KERATOSIS 07/31/2007  . Neoplasm of uncertain behavior of skin 06/21/2007  . ABNORMAL GLUCOSE NEC 06/21/2007  . ABNORMAL LIVER FUNCTION TESTS 06/21/2007  . Anxiety state 11/25/2006  . Essential hypertension 11/25/2006  . ALLERGIC RHINITIS 11/25/2006  . Asthma 11/25/2006  . GERD 11/25/2006    Past Surgical History:  Procedure Laterality Date  . CHOLECYSTECTOMY    . NASAL SINUS SURGERY    . TOTAL HIP ARTHROPLASTY         Home Medications    Prior to Admission medications   Medication Sig Start Date End Date Taking? Authorizing Provider  albuterol (PROAIR HFA) 108 (90 Base) MCG/ACT inhaler Inhale 2 puffs into the lungs every 4 (four) hours as  needed for wheezing or shortness of breath. 02/25/17   Plotnikov, Evie Lacks, MD  azithromycin (ZITHROMAX Z-PAK) 250 MG tablet Take 2 tabs today; then begin one tab once daily for 4 more days. (Rx void after 03/16/17) 03/08/17   Kandra Nicolas, MD  beclomethasone (QVAR) 80 MCG/ACT inhaler INHALE 1 PUFF INTO THE LUNGS TWICE A DAY 02/25/17   Plotnikov, Evie Lacks, MD  clonazePAM (KLONOPIN) 0.5 MG tablet Take 1 tablet (0.5 mg total) by mouth at bedtime as needed. 02/25/17   Plotnikov, Evie Lacks, MD  escitalopram (LEXAPRO) 10 MG tablet Take 1 tablet (10 mg total) by mouth daily. 02/25/17   Plotnikov, Evie Lacks, MD  fenofibrate 160 MG tablet Take 1 tablet (160 mg total) by mouth daily. Keep appt for future refills 02/25/17   Plotnikov, Evie Lacks, MD  olmesartan-hydrochlorothiazide (BENICAR HCT) 20-12.5 MG tablet Take 1 tablet by mouth daily. 02/25/17   Plotnikov, Evie Lacks, MD  pantoprazole (PROTONIX) 40 MG tablet Take 1 tablet (40 mg total) by mouth daily. 02/25/17   Plotnikov, Evie Lacks, MD  pramipexole (MIRAPEX) 0.25 MG tablet Take 1-2 tablets (0.25-0.5 mg total) by mouth at bedtime. 02/25/17   Plotnikov, Evie Lacks, MD  predniSONE (DELTASONE) 20 MG tablet Take one tab by mouth twice daily for 4 days, then one daily. Take with food. 03/08/17   Kandra Nicolas, MD  triamcinolone ointment (KENALOG) 0.1 %  Apply 1 application topically 2 (two) times daily. 02/25/17   Plotnikov, Evie Lacks, MD    Family History Family History  Problem Relation Age of Onset  . Hypertension Other   . Hypertension Other   . Diabetes Other        1st degree relative     Social History Social History   Tobacco Use  . Smoking status: Never Smoker  . Smokeless tobacco: Never Used  Substance Use Topics  . Alcohol use: Yes    Alcohol/week: 7.0 oz    Types: 14 Standard drinks or equivalent per week    Comment: 2/day  . Drug use: No     Allergies   Midazolam; Molds & smuts; Other; and Pollen extract   Review of Systems Review of  Systems + sore throat + cough No pleuritic pain No wheezing + nasal congestion + post-nasal drainage No sinus pain/pressure No itchy/red eyes No earache No hemoptysis No SOB No fever/chills No nausea No vomiting No abdominal pain No diarrhea No urinary symptoms No skin rash + fatigue No myalgias No headache + left lower back pain Used OTC meds without relief   Physical Exam Triage Vital Signs ED Triage Vitals [03/08/17 1300]  Enc Vitals Group     BP 136/79     Pulse Rate 63     Resp      Temp 97.9 F (36.6 C)     Temp Source Oral     SpO2 97 %     Weight 212 lb (96.2 kg)     Height 6' (1.829 m)     Head Circumference      Peak Flow      Pain Score 0     Pain Loc      Pain Edu?      Excl. in [place]?    No data found.  Updated Vital Signs BP 136/79 (BP Location: Right Arm)   Pulse 63   Temp 97.9 F (36.6 C) (Oral)   Ht 6' (1.829 m)   Wt 212 lb (96.2 kg)   SpO2 97%   BMI 28.75 kg/m   Visual Acuity Right Eye Distance:   Left Eye Distance:   Bilateral Distance:    Right Eye Near:   Left Eye Near:    Bilateral Near:     Physical Exam Nursing notes and Vital Signs reviewed. Appearance:  Patient appears stated age, and in no acute distress Eyes:  Pupils are equal, round, and reactive to light and accomodation.  Extraocular movement is intact.  Conjunctivae are not inflamed  Ears:  Canals normal.  Tympanic membranes normal.  Nose:  Mildly congested turbinates.  No sinus tenderness. Pharynx:  Normal Neck:  Supple.  Enlarged posterior/lateral nodes are palpated bilaterally, tender to palpation on the left.   Lungs:  Clear to auscultation.  Breath sounds are equal.  Moving air well. Heart:  Regular rate and rhythm without murmurs, rubs, or gallops.  Abdomen:  Nontender without masses or hepatosplenomegaly.  Bowel sounds are present.  No CVA or flank tenderness.  Extremities:  No edema.  Skin:  No rash present.   Back:  Range of motion relatively well  preserved. Tenderness in the left paraspinous muscles from L3 to Sacral area.  Straight leg raising test is negative.  Sitting knee extension test is negative.  Strength and sensation in the lower extremities is normal.  Patellar and achilles reflexes are normal  UC Treatments / Results  Labs (all labs ordered are  listed, but only abnormal results are displayed) Labs Reviewed - No data to display  EKG  EKG Interpretation None       Radiology No results found.  Procedures Procedures (including critical care time)  Medications Ordered in UC Medications - No data to display   Initial Impression / Assessment and Plan / UC Course  I have reviewed the triage vital signs and the nursing notes.  Pertinent labs & imaging results that were available during my care of the patient were reviewed by me and considered in my medical decision making (see chart for details).    There is no evidence of bacterial infection today.  Treat symptomatically for now  Begin prednisone burst/taper. Take plain guaifenesin (1200mg  extended release tabs such as Mucinex) twice daily, with plenty of water, for cough and congestion.  Get adequate rest.   May use Afrin nasal spray (or generic oxymetazoline) each morning for about 5 days and then discontinue.  Also recommend using saline nasal spray several times daily and saline nasal irrigation (AYR is a common brand).  Use Flonase nasal spray each morning after using Afrin nasal spray and saline nasal irrigation. Try warm salt water gargles for sore throat.  Stop all antihistamines for now, and other non-prescription cough/cold preparations. May take Tylenol as needed for lower back pain. Continue all inhalers as prescribed. May continue Robitussin DM at bedtime. Begin Azithromycin if not improving about one week or if persistent fever develops (Given a prescription to hold, with an expiration date)  Follow-up with family doctor if not improving about10 days.       Final Clinical Impressions(s) / UC Diagnoses   Final diagnoses:  Viral URI with cough  Acute left-sided low back pain without sciatica    ED Discharge Orders        Ordered    predniSONE (DELTASONE) 20 MG tablet     03/08/17 1437    azithromycin (ZITHROMAX Z-PAK) 250 MG tablet     03/08/17 1437           Kandra Nicolas, MD 03/19/17 1150

## 2017-03-18 ENCOUNTER — Other Ambulatory Visit (INDEPENDENT_AMBULATORY_CARE_PROVIDER_SITE_OTHER): Payer: BLUE CROSS/BLUE SHIELD

## 2017-03-18 DIAGNOSIS — E785 Hyperlipidemia, unspecified: Secondary | ICD-10-CM

## 2017-03-18 DIAGNOSIS — I10 Essential (primary) hypertension: Secondary | ICD-10-CM | POA: Diagnosis not present

## 2017-03-18 DIAGNOSIS — R197 Diarrhea, unspecified: Secondary | ICD-10-CM | POA: Diagnosis not present

## 2017-03-18 LAB — LIPID PANEL
CHOLESTEROL: 182 mg/dL (ref 0–200)
HDL: 50.7 mg/dL (ref >39.00)
LDL Cholesterol: 109 mg/dL — ABNORMAL HIGH (ref 0–99)
NonHDL: 131.15
TRIGLYCERIDES: 111 mg/dL (ref 0.0–149.0)
Total CHOL/HDL Ratio: 4
VLDL: 22.2 mg/dL (ref 0.0–40.0)

## 2017-03-18 LAB — URINALYSIS, ROUTINE W REFLEX MICROSCOPIC
Bilirubin Urine: NEGATIVE
Ketones, ur: NEGATIVE
Leukocytes, UA: NEGATIVE
NITRITE: NEGATIVE
Specific Gravity, Urine: >=1.03 — AB (ref 1.000–1.030)
Total Protein, Urine: NEGATIVE
URINE GLUCOSE: NEGATIVE
Urobilinogen, UA: 0.2 (ref 0.0–1.0)
pH: 6 (ref 5.0–8.0)

## 2017-03-18 LAB — CBC WITH DIFFERENTIAL/PLATELET
BASOS PCT: 0.6 % (ref 0.0–3.0)
Basophils Absolute: 0 10*3/uL (ref 0.0–0.1)
Eosinophils Absolute: 0.4 10*3/uL (ref 0.0–0.7)
Eosinophils Relative: 5.5 % — ABNORMAL HIGH (ref 0.0–5.0)
HCT: 44 % (ref 39.0–52.0)
Hemoglobin: 15.1 g/dL (ref 13.0–17.0)
Lymphocytes Relative: 33.1 % (ref 12.0–46.0)
Lymphs Abs: 2.3 10*3/uL (ref 0.7–4.0)
MCHC: 34.2 g/dL (ref 30.0–36.0)
MCV: 92.3 fl (ref 78.0–100.0)
MONOS PCT: 12 % (ref 3.0–12.0)
Monocytes Absolute: 0.8 10*3/uL (ref 0.1–1.0)
NEUTROS ABS: 3.4 10*3/uL (ref 1.4–7.7)
Neutrophils Relative %: 48.8 % (ref 43.0–77.0)
PLATELETS: 195 10*3/uL (ref 150.0–400.0)
RBC: 4.77 Mil/uL (ref 4.22–5.81)
RDW: 13.7 % (ref 11.5–15.5)
WBC: 7 10*3/uL (ref 4.0–10.5)

## 2017-03-18 LAB — HEPATIC FUNCTION PANEL
ALBUMIN: 4.3 g/dL (ref 3.5–5.2)
ALT: 59 U/L — ABNORMAL HIGH (ref 0–53)
AST: 26 U/L (ref 0–37)
Alkaline Phosphatase: 36 U/L — ABNORMAL LOW (ref 39–117)
Bilirubin, Direct: 0.1 mg/dL (ref 0.0–0.3)
TOTAL PROTEIN: 6.9 g/dL (ref 6.0–8.3)
Total Bilirubin: 0.5 mg/dL (ref 0.2–1.2)

## 2017-03-18 LAB — BASIC METABOLIC PANEL
BUN: 17 mg/dL (ref 6–23)
CALCIUM: 9.5 mg/dL (ref 8.4–10.5)
CO2: 27 mEq/L (ref 19–32)
Chloride: 105 mEq/L (ref 96–112)
Creatinine, Ser: 1.05 mg/dL (ref 0.40–1.50)
GFR: 78.76 mL/min (ref >60.00)
Glucose, Bld: 98 mg/dL (ref 70–99)
Potassium: 4.3 mEq/L (ref 3.5–5.1)
Sodium: 140 mEq/L (ref 135–145)

## 2017-03-18 LAB — TSH: TSH: 1.51 u[IU]/mL (ref 0.35–4.50)

## 2017-04-11 DIAGNOSIS — J340 Abscess, furuncle and carbuncle of nose: Secondary | ICD-10-CM | POA: Diagnosis not present

## 2017-04-20 ENCOUNTER — Encounter: Payer: Self-pay | Admitting: Internal Medicine

## 2017-04-29 ENCOUNTER — Encounter: Payer: Self-pay | Admitting: Gastroenterology

## 2017-06-10 ENCOUNTER — Other Ambulatory Visit: Payer: Self-pay

## 2017-06-10 ENCOUNTER — Ambulatory Visit (AMBULATORY_SURGERY_CENTER): Payer: Self-pay | Admitting: *Deleted

## 2017-06-10 VITALS — Ht 72.0 in | Wt 211.0 lb

## 2017-06-10 DIAGNOSIS — Z1211 Encounter for screening for malignant neoplasm of colon: Secondary | ICD-10-CM

## 2017-06-10 MED ORDER — PEG-KCL-NACL-NASULF-NA ASC-C 140 G PO SOLR
1.0000 | ORAL | 0 refills | Status: DC
Start: 2017-06-10 — End: 2017-06-24

## 2017-06-10 NOTE — Progress Notes (Signed)
No egg or soy allergy known to patient  Has some GI issues after eating eggs sometimes  Issues with past sedation with surgeries he states that he felt like an elephant was sitting on his chest after receiving Midazolam before sinus surgery 1994   or procedures, no intubation problems  No diet pills per patient No home 02 use per patient  No blood thinners per patient  Pt denies issues with constipation  No A fib or A flutter  EMMI video sent to pt's e mail   [employer] coupon given to patient.

## 2017-06-24 ENCOUNTER — Ambulatory Visit (AMBULATORY_SURGERY_CENTER): Payer: BLUE CROSS/BLUE SHIELD | Admitting: Gastroenterology

## 2017-06-24 ENCOUNTER — Other Ambulatory Visit: Payer: Self-pay

## 2017-06-24 ENCOUNTER — Encounter: Payer: Self-pay | Admitting: Gastroenterology

## 2017-06-24 VITALS — BP 92/71 | HR 82 | Temp 98.0°F | Resp 16 | Ht 72.0 in | Wt 211.0 lb

## 2017-06-24 DIAGNOSIS — D122 Benign neoplasm of ascending colon: Secondary | ICD-10-CM

## 2017-06-24 DIAGNOSIS — Z1211 Encounter for screening for malignant neoplasm of colon: Secondary | ICD-10-CM | POA: Diagnosis not present

## 2017-06-24 DIAGNOSIS — D127 Benign neoplasm of rectosigmoid junction: Secondary | ICD-10-CM

## 2017-06-24 DIAGNOSIS — D125 Benign neoplasm of sigmoid colon: Secondary | ICD-10-CM | POA: Diagnosis not present

## 2017-06-24 DIAGNOSIS — K635 Polyp of colon: Secondary | ICD-10-CM

## 2017-06-24 MED ORDER — SODIUM CHLORIDE 0.9 % IV SOLN: 500.0000 mL | Freq: Once | INTRAVENOUS | Status: DC

## 2017-06-24 NOTE — Progress Notes (Signed)
To PACU, VSS. Report to [email or web address] 

## 2017-06-24 NOTE — Progress Notes (Signed)
Called to room to assist during endoscopic procedure.  Patient ID and intended procedure confirmed with present staff. Received instructions for my participation in the procedure from the performing physician.  

## 2017-06-24 NOTE — Patient Instructions (Signed)
YOU HAD AN ENDOSCOPIC PROCEDURE TODAY AT THE [facility]:   Refer to the procedure report that was given to you for any specific questions about what was found during the examination.  If the procedure report does not answer your questions, please call your gastroenterologist to clarify.  If you requested that your care partner not be given the details of your procedure findings, then the procedure report has been included in a sealed envelope for you to review at your convenience later.  YOU SHOULD EXPECT: Some feelings of bloating in the abdomen. Passage of more gas than usual.  Walking can help get rid of the air that was put into your GI tract during the procedure and reduce the bloating. If you had a lower endoscopy (such as a colonoscopy or flexible sigmoidoscopy) you may notice spotting of blood in your stool or on the toilet paper. If you underwent a bowel prep for your procedure, you may not have a normal bowel movement for a few days.  Please Note:  You might notice some irritation and congestion in your nose or some drainage.  This is from the oxygen used during your procedure.  There is no need for concern and it should clear up in a day or so.  SYMPTOMS TO REPORT IMMEDIATELY:   Following lower endoscopy (colonoscopy or flexible sigmoidoscopy):  Excessive amounts of blood in the stool  Significant tenderness or worsening of abdominal pains  Swelling of the abdomen that is new, acute  Fever of 100F or higher   For urgent or emergent issues, a gastroenterologist can be reached at any hour by calling [phone].   DIET:  We do recommend a small meal at first, but then you may proceed to your regular diet.  Drink plenty of fluids but you should avoid alcoholic beverages for 24 hours.  ACTIVITY:  You should plan to take it easy for the rest of today and you should NOT DRIVE or use heavy machinery until tomorrow (because of the sedation medicines used during the test).     FOLLOW UP: Our staff will call the number listed on your records the next business day following your procedure to check on you and address any questions or concerns that you may have regarding the information given to you following your procedure. If we do not reach you, we will leave a message.  However, if you are feeling well and you are not experiencing any problems, there is no need to return our call.  We will assume that you have returned to your regular daily activities without incident.  If any biopsies were taken you will be contacted by phone or by letter within the next 1-3 weeks.  Please call us at [phone] if you have not heard about the biopsies in 3 weeks.    SIGNATURES/CONFIDENTIALITY: You and/or your care partner have signed paperwork which will be entered into your electronic medical record.  These signatures attest to the fact that that the information above on your After Visit Summary has been reviewed and is understood.  Full responsibility of the confidentiality of this discharge information lies with you and/or your care-partner.  Polyp information given. 

## 2017-06-24 NOTE — Op Note (Signed)
[place] Patient Name: Darrell Santos Procedure Date: 06/24/2017 10:23 AM MRN: [id number] Endoscopist: [place]. Loletha Santos , MD Age: 52 Referring MD:  Date of Birth: 1965-11-19 Gender: Male Account #: [id number] Procedure:                Colonoscopy Indications:              Screening for colorectal malignant neoplasm, This                            is the patient's first screening colonoscopy Medicines:                Monitored Anesthesia Care Procedure:                Pre-Anesthesia Assessment:                           - Prior to the procedure, a History and Physical                            was performed, and patient medications and                            allergies were reviewed. The patient's tolerance of                            previous anesthesia was also reviewed. The risks                            and benefits of the procedure and the sedation                            options and risks were discussed with the patient.                            All questions were answered, and informed consent                            was obtained. Prior Anticoagulants: The patient has                            taken no previous anticoagulant or antiplatelet                            agents. ASA Grade Assessment: II - A patient with                            mild systemic disease. After reviewing the risks                            and benefits, the patient was deemed in                            satisfactory condition to undergo the procedure.  After obtaining informed consent, the colonoscope                            was passed under direct vision. Throughout the                            procedure, the patient's blood pressure, pulse, and                            oxygen saturations were monitored continuously. The                            Colonoscope was introduced through the anus and                            advanced to the  the cecum, identified by                            appendiceal orifice and ileocecal valve. The                            colonoscopy was somewhat difficult due to a                            tortuous sigmoid colon. The patient tolerated the                            procedure well. The quality of the bowel                            preparation was excellent. The ileocecal valve,                            appendiceal orifice, and rectum were photographed. Scope In: 10:36:28 AM Scope Out: 10:52:27 AM Scope Withdrawal Time: 0 hours 11 minutes 16 seconds  Total Procedure Duration: 0 hours 15 minutes 59 seconds  Findings:                 The perianal and digital rectal examinations were                            normal.                           Two sessile polyps were found in the recto-sigmoid                            colon and ascending colon. The polyps were 4 to 6                            mm in size. These polyps were removed with a cold                            snare. Resection and retrieval were complete.  The exam was otherwise without abnormality on                            direct and retroflexion views. Complications:            No immediate complications. Estimated Blood Loss:     Estimated blood loss was minimal. Impression:               - Two 4 to 6 mm polyps at the recto-sigmoid colon                            and in the ascending colon, removed with a cold                            snare. Resected and retrieved.                           - The examination was otherwise normal on direct                            and retroflexion views. Recommendation:           - Patient has a contact number available for                            emergencies. The signs and symptoms of potential                            delayed complications were discussed with the                            patient. Return to normal activities tomorrow.                             Written discharge instructions were provided to the                            patient.                           - Resume previous diet.                           - Continue present medications.                           - Await pathology results.                           - Repeat colonoscopy is recommended for                            surveillance. The colonoscopy date will be                            determined after pathology results from today's  exam become available for review. Darrell L. Loletha Carrow, MD 06/24/2017 10:57:53 AM This report has been signed electronically.

## 2017-06-27 ENCOUNTER — Telehealth: Payer: Self-pay

## 2017-06-27 NOTE — Telephone Encounter (Signed)
  Follow up Call-  Call back number 06/24/2017  Permission to leave phone message Yes  Some recent data might be hidden     Patient questions:  Do you have a fever, pain , or abdominal swelling? No. Pain Score  0 *  Have you tolerated food without any problems? Yes.    Have you been able to return to your normal activities? Yes.    Do you have any questions about your discharge instructions: Diet   No. Medications  No. Follow up visit  No.  Do you have questions or concerns about your Care? No.  Actions: * If pain score is 4 or above: No action needed, pain <4.

## 2017-06-30 ENCOUNTER — Encounter: Payer: Self-pay | Admitting: Gastroenterology

## 2017-07-04 ENCOUNTER — Other Ambulatory Visit: Payer: Self-pay | Admitting: Internal Medicine

## 2017-08-12 ENCOUNTER — Telehealth: Payer: Self-pay

## 2017-08-12 DIAGNOSIS — M87051 Idiopathic aseptic necrosis of right femur: Secondary | ICD-10-CM | POA: Diagnosis not present

## 2017-08-12 NOTE — Telephone Encounter (Signed)
Pt needs OV to get referral, please call pt to schedule  Copied from [place] [id number]. Topic: General - Other >> Aug 12, 2017  2:24 PM Oneta Rack wrote: Osvaldo Human name: Darrell Santos  Relation to pt: Surgery Cor. from [facility]  Call back number: [phone] FAX # [phone]    Reason for call:  Requesting referral due to patient upcoming right hip surgery scheduled surgery date 09/07/17. Darrell Santos would like a follow up call to confirm referral was sent, please advise

## 2017-08-12 NOTE — Telephone Encounter (Signed)
Appointment has been made for 09/01/17

## 2017-08-17 NOTE — Telephone Encounter (Signed)
Darrell Santos called back asking about referral, she was notified the pt has an appt on 08/08 to get referral.  She requested to be called when referral is faxed

## 2017-09-01 ENCOUNTER — Ambulatory Visit: Payer: BLUE CROSS/BLUE SHIELD | Admitting: Internal Medicine

## 2017-09-01 ENCOUNTER — Encounter: Payer: Self-pay | Admitting: Internal Medicine

## 2017-09-01 VITALS — BP 116/78 | HR 81 | Temp 98.1°F | Ht 72.0 in | Wt 210.0 lb

## 2017-09-01 DIAGNOSIS — F411 Generalized anxiety disorder: Secondary | ICD-10-CM

## 2017-09-01 DIAGNOSIS — J4520 Mild intermittent asthma, uncomplicated: Secondary | ICD-10-CM

## 2017-09-01 DIAGNOSIS — Z01818 Encounter for other preprocedural examination: Secondary | ICD-10-CM | POA: Diagnosis not present

## 2017-09-01 MED ORDER — CLONAZEPAM 0.5 MG PO TABS: 0.5000 mg | ORAL_TABLET | Freq: Every evening | ORAL | 3 refills | Status: DC | PRN

## 2017-09-01 NOTE — Progress Notes (Signed)
Subjective:  Patient ID: Darrell Santos, male    DOB: Oct 22, 1965  Age: 52 y.o. MRN: [id number]  CC: No chief complaint on file.   HPI Darrell Santos presents for  Req by  Dr Rosanne Ashing Reason - THR R med clearance Hx: 52 yo male w/HTN, anxiety, dyslipidemia - all stable. He had labs at [facility] R ear scratching  Past Medical History:  Diagnosis Date  . Allergic rhinitis   . Allergy   . Anxiety   . Asthma   . GERD (gastroesophageal reflux disease)   . HTN (hypertension)   . Hyperlipidemia    borderline  . Rosacea    Past Surgical History:  Procedure Laterality Date  . CHOLECYSTECTOMY    . COLONOSCOPY    . NASAL SINUS SURGERY    . TOTAL HIP ARTHROPLASTY      reports that he has never smoked. He quit smokeless tobacco use about 20 years ago. He reports that he drinks about 14.0 standard drinks of alcohol per week. He reports that he does not use drugs. family history includes Diabetes in his other; Hypertension in his other and other. Allergies  Allergen Reactions  . Midazolam Shortness Of Breath    (Pre-op)- "dificulty breathing with associated heaviness in chest"   . Molds & Smuts Shortness Of Breath  . Other Anaphylaxis    Cats and nuts  . Pollen Extract      Outpatient Medications Prior to Visit  Medication Sig Dispense Refill  . albuterol (PROAIR HFA) 108 (90 Base) MCG/ACT inhaler Inhale 2 puffs into the lungs every 4 (four) hours as needed for wheezing or shortness of breath. 3 Inhaler 0  . beclomethasone (QVAR) 80 MCG/ACT inhaler INHALE 1 PUFF INTO THE LUNGS TWICE A DAY 3 Inhaler 3  . clonazePAM (KLONOPIN) 0.5 MG tablet Take 1 tablet (0.5 mg total) by mouth at bedtime as needed. 30 tablet 2  . escitalopram (LEXAPRO) 10 MG tablet Take 1 tablet (10 mg total) by mouth daily. 90 tablet 3  . escitalopram (LEXAPRO) 10 MG tablet TAKE 1 TABLET DAILY 90 tablet 1  . fenofibrate 160 MG tablet Take 1 tablet (160 mg total) by mouth daily. Keep appt for future  refills 90 tablet 3  . olmesartan-hydrochlorothiazide (BENICAR HCT) 20-12.5 MG tablet Take 1 tablet by mouth daily. 90 tablet 3  . pantoprazole (PROTONIX) 40 MG tablet Take 1 tablet (40 mg total) by mouth daily. 90 tablet 3  . triamcinolone ointment (KENALOG) 0.1 % Apply 1 application topically 2 (two) times daily. 60 g 1   Facility-Administered Medications Prior to Visit  Medication Dose Route Frequency Provider Last Rate Last Dose  . 0.9 %  sodium chloride infusion  500 mL Intravenous Once Nelida Meuse III, MD        ROS: Review of Systems  Constitutional: Negative for appetite change, fatigue and unexpected weight change.  HENT: Negative for congestion, nosebleeds, sneezing, sore throat and trouble swallowing.   Eyes: Negative for itching and visual disturbance.  Respiratory: Negative for cough.   Cardiovascular: Negative for chest pain, palpitations and leg swelling.  Gastrointestinal: Negative for abdominal distention, blood in stool, diarrhea and nausea.  Genitourinary: Negative for frequency and hematuria.  Musculoskeletal: Positive for arthralgias and gait problem. Negative for back pain, joint swelling and neck pain.  Skin: Negative for rash.  Neurological: Negative for dizziness, tremors, speech difficulty and weakness.  Psychiatric/Behavioral: Negative for agitation, dysphoric mood, sleep disturbance and suicidal ideas. The  patient is nervous/anxious.     Objective:  BP 116/78 (BP Location: Left Arm, Patient Position: Sitting, Cuff Size: Normal)   Pulse 81   Temp 98.1 F (36.7 C) (Oral)   Ht 6' (1.829 m)   Wt 210 lb (95.3 kg)   SpO2 97%   BMI 28.48 kg/m   BP Readings from Last 3 Encounters:  09/01/17 116/78  06/24/17 92/71  03/08/17 136/79    Wt Readings from Last 3 Encounters:  09/01/17 210 lb (95.3 kg)  06/24/17 211 lb (95.7 kg)  06/10/17 211 lb (95.7 kg)    Physical Exam  Constitutional: He is oriented to person, place, and time. He appears  well-developed. No distress.  NAD  HENT:  Mouth/Throat: Oropharynx is clear and moist.  Eyes: Pupils are equal, round, and reactive to light. Conjunctivae are normal.  Neck: Normal range of motion. No JVD present. No thyromegaly present.  Cardiovascular: Normal rate, regular rhythm, normal heart sounds and intact distal pulses. Exam reveals no gallop and no friction rub.  No murmur heard. Pulmonary/Chest: Effort normal and breath sounds normal. No respiratory distress. He has no wheezes. He has no rales. He exhibits no tenderness.  Abdominal: Soft. Bowel sounds are normal. He exhibits no distension and no mass. There is no tenderness. There is no rebound and no guarding.  Musculoskeletal: Normal range of motion. He exhibits tenderness. He exhibits no edema.  Lymphadenopathy:    He has no cervical adenopathy.  Neurological: He is alert and oriented to person, place, and time. He has normal reflexes. No cranial nerve deficit. He exhibits normal muscle tone. He displays a negative Romberg sign. Coordination and gait normal.  Skin: Skin is warm and dry. No rash noted.  Psychiatric: He has a normal mood and affect. His behavior is normal. Judgment and thought content normal.  hair clipping in the R ear canal R hip - painw/ROM  Procedure: EKG Indication: pre-op Impression: NSR. No acute changes.   Lab Results  Component Value Date   WBC 7.0 03/18/2017   HGB 15.1 03/18/2017   HCT 44.0 03/18/2017   PLT 195.0 03/18/2017   GLUCOSE 98 03/18/2017   CHOL 182 03/18/2017   TRIG 111.0 03/18/2017   HDL 50.70 03/18/2017   LDLDIRECT 87.0 09/04/2015   LDLCALC 109 (H) 03/18/2017   ALT 59 (H) 03/18/2017   AST 26 03/18/2017   NA 140 03/18/2017   K 4.3 03/18/2017   CL 105 03/18/2017   CREATININE 1.05 03/18/2017   BUN 17 03/18/2017   CO2 27 03/18/2017   TSH 1.51 03/18/2017   PSA 0.49 09/04/2015    No results found.  Assessment & Plan:   There are no diagnoses linked to this  encounter.   No orders of the defined types were placed in this encounter.    Follow-up: No follow-ups on file.  Walker Kehr, MD

## 2017-09-01 NOTE — Assessment & Plan Note (Addendum)
Darrell Santos is medically clear for his R hip surgery. EKG OK. Continue inhalers perioperatively: QVAR daily. Proair HFA prn Thank you!

## 2017-09-01 NOTE — Assessment & Plan Note (Signed)
Clonazepam prn  Potential benefits of a long term benzodiazepines  use as well as potential risks  and complications were explained to the patient and were aknowledged.  

## 2017-09-01 NOTE — Assessment & Plan Note (Signed)
Continue inhalers perioperatively QVAR daily Proair HFA prn

## 2017-09-05 DIAGNOSIS — Z7409 Other reduced mobility: Secondary | ICD-10-CM | POA: Diagnosis not present

## 2017-09-05 DIAGNOSIS — M25651 Stiffness of right hip, not elsewhere classified: Secondary | ICD-10-CM | POA: Diagnosis not present

## 2017-09-05 DIAGNOSIS — M25551 Pain in right hip: Secondary | ICD-10-CM | POA: Diagnosis not present

## 2017-09-05 DIAGNOSIS — M87851 Other osteonecrosis, right femur: Secondary | ICD-10-CM | POA: Diagnosis not present

## 2017-09-05 DIAGNOSIS — Z96641 Presence of right artificial hip joint: Secondary | ICD-10-CM | POA: Diagnosis not present

## 2017-09-05 DIAGNOSIS — R269 Unspecified abnormalities of gait and mobility: Secondary | ICD-10-CM | POA: Diagnosis not present

## 2017-09-07 ENCOUNTER — Other Ambulatory Visit: Payer: Self-pay

## 2017-09-07 DIAGNOSIS — F329 Major depressive disorder, single episode, unspecified: Secondary | ICD-10-CM | POA: Diagnosis not present

## 2017-09-07 DIAGNOSIS — M25551 Pain in right hip: Secondary | ICD-10-CM | POA: Diagnosis not present

## 2017-09-07 DIAGNOSIS — G8918 Other acute postprocedural pain: Secondary | ICD-10-CM | POA: Diagnosis not present

## 2017-09-07 DIAGNOSIS — I10 Essential (primary) hypertension: Secondary | ICD-10-CM | POA: Diagnosis not present

## 2017-09-07 DIAGNOSIS — E785 Hyperlipidemia, unspecified: Secondary | ICD-10-CM | POA: Diagnosis not present

## 2017-09-07 DIAGNOSIS — F419 Anxiety disorder, unspecified: Secondary | ICD-10-CM | POA: Diagnosis not present

## 2017-09-07 DIAGNOSIS — Z471 Aftercare following joint replacement surgery: Secondary | ICD-10-CM | POA: Diagnosis not present

## 2017-09-07 DIAGNOSIS — M87051 Idiopathic aseptic necrosis of right femur: Secondary | ICD-10-CM | POA: Diagnosis not present

## 2017-09-07 DIAGNOSIS — Z96643 Presence of artificial hip joint, bilateral: Secondary | ICD-10-CM | POA: Diagnosis not present

## 2017-09-07 DIAGNOSIS — M1611 Unilateral primary osteoarthritis, right hip: Secondary | ICD-10-CM | POA: Diagnosis not present

## 2017-09-07 DIAGNOSIS — J45909 Unspecified asthma, uncomplicated: Secondary | ICD-10-CM | POA: Diagnosis not present

## 2017-09-07 DIAGNOSIS — K219 Gastro-esophageal reflux disease without esophagitis: Secondary | ICD-10-CM | POA: Diagnosis not present

## 2017-09-08 DIAGNOSIS — M25551 Pain in right hip: Secondary | ICD-10-CM | POA: Diagnosis not present

## 2017-09-08 DIAGNOSIS — I10 Essential (primary) hypertension: Secondary | ICD-10-CM | POA: Diagnosis not present

## 2017-09-08 DIAGNOSIS — K219 Gastro-esophageal reflux disease without esophagitis: Secondary | ICD-10-CM | POA: Diagnosis not present

## 2017-09-08 DIAGNOSIS — J45909 Unspecified asthma, uncomplicated: Secondary | ICD-10-CM | POA: Diagnosis not present

## 2017-09-08 MED ORDER — SENNOSIDES-DOCUSATE SODIUM 8.6-50 MG PO TABS
2.00 | ORAL_TABLET | ORAL | Status: DC
Start: 2017-09-08 — End: 2017-09-08

## 2017-09-08 MED ORDER — NALOXONE HCL 0.4 MG/ML IJ SOLN
.40 | INTRAMUSCULAR | Status: DC
Start: ? — End: 2017-09-08

## 2017-09-08 MED ORDER — GENERIC EXTERNAL MEDICATION
1.00 | Status: DC
Start: ? — End: 2017-09-08

## 2017-09-08 MED ORDER — ACETAMINOPHEN 650 MG/20.3ML PO SOLN
1000.00 | ORAL | Status: DC
Start: 2017-09-08 — End: 2017-09-08

## 2017-09-08 MED ORDER — SODIUM CHLORIDE 0.9 % IJ SOLN
10.00 | INTRAMUSCULAR | Status: DC
Start: 2017-09-08 — End: 2017-09-08

## 2017-09-08 MED ORDER — ALBUTEROL SULFATE (2.5 MG/3ML) 0.083% IN NEBU
2.50 | INHALATION_SOLUTION | RESPIRATORY_TRACT | Status: DC
Start: ? — End: 2017-09-08

## 2017-09-08 MED ORDER — ESCITALOPRAM OXALATE 10 MG PO TABS
10.00 | ORAL_TABLET | ORAL | Status: DC
Start: 2017-09-09 — End: 2017-09-08

## 2017-09-08 MED ORDER — ALUMINUM-MAGNESIUM-SIMETHICONE 200-200-20 MG/5ML PO SUSP
30.00 | ORAL | Status: DC
Start: ? — End: 2017-09-08

## 2017-09-08 MED ORDER — BECLOMETHASONE DIPROP HFA 80 MCG/ACT IN AERB
1.00 | INHALATION_SPRAY | RESPIRATORY_TRACT | Status: DC
Start: 2017-09-09 — End: 2017-09-08

## 2017-09-08 MED ORDER — OXYCODONE HCL 5 MG PO TABS
5.00 | ORAL_TABLET | ORAL | Status: DC
Start: ? — End: 2017-09-08

## 2017-09-08 MED ORDER — OXYCODONE HCL 5 MG PO TABS
10.00 | ORAL_TABLET | ORAL | Status: DC
Start: ? — End: 2017-09-08

## 2017-09-08 MED ORDER — LACTATED RINGERS IV SOLN
INTRAVENOUS | Status: DC
Start: ? — End: 2017-09-08

## 2017-09-08 MED ORDER — PANTOPRAZOLE SODIUM 40 MG PO TBEC
40.00 | DELAYED_RELEASE_TABLET | ORAL | Status: DC
Start: 2017-09-09 — End: 2017-09-08

## 2017-09-08 MED ORDER — ASPIRIN EC 325 MG PO TBEC
325.00 | DELAYED_RELEASE_TABLET | ORAL | Status: DC
Start: 2017-09-08 — End: 2017-09-08

## 2017-09-08 MED ORDER — SODIUM CHLORIDE 0.9 % IJ SOLN
10.00 | INTRAMUSCULAR | Status: DC
Start: ? — End: 2017-09-08

## 2017-09-08 MED ORDER — GENERIC EXTERNAL MEDICATION
50.00 | Status: DC
Start: ? — End: 2017-09-08

## 2017-09-08 MED ORDER — MELOXICAM 7.5 MG PO TABS
7.50 | ORAL_TABLET | ORAL | Status: DC
Start: 2017-09-09 — End: 2017-09-08

## 2017-09-08 MED ORDER — GENERIC EXTERNAL MEDICATION
25.00 | Status: DC
Start: ? — End: 2017-09-08

## 2017-09-08 MED ORDER — GABAPENTIN 300 MG PO CAPS
300.00 | ORAL_CAPSULE | ORAL | Status: DC
Start: 2017-09-08 — End: 2017-09-08

## 2017-09-08 MED ORDER — BISACODYL 10 MG RE SUPP
10.00 | RECTAL | Status: DC
Start: ? — End: 2017-09-08

## 2017-09-08 MED ORDER — NALOXONE HCL 0.4 MG/ML IJ SOLN
0.10 | INTRAMUSCULAR | Status: DC
Start: ? — End: 2017-09-08

## 2017-09-12 DIAGNOSIS — R269 Unspecified abnormalities of gait and mobility: Secondary | ICD-10-CM | POA: Diagnosis not present

## 2017-09-12 DIAGNOSIS — M25551 Pain in right hip: Secondary | ICD-10-CM | POA: Diagnosis not present

## 2017-09-12 DIAGNOSIS — Z7409 Other reduced mobility: Secondary | ICD-10-CM | POA: Diagnosis not present

## 2017-09-12 DIAGNOSIS — M25651 Stiffness of right hip, not elsewhere classified: Secondary | ICD-10-CM | POA: Diagnosis not present

## 2017-09-12 DIAGNOSIS — M87851 Other osteonecrosis, right femur: Secondary | ICD-10-CM | POA: Diagnosis not present

## 2017-09-12 DIAGNOSIS — Z96641 Presence of right artificial hip joint: Secondary | ICD-10-CM | POA: Diagnosis not present

## 2017-09-15 DIAGNOSIS — Z7409 Other reduced mobility: Secondary | ICD-10-CM | POA: Diagnosis not present

## 2017-09-15 DIAGNOSIS — M25551 Pain in right hip: Secondary | ICD-10-CM | POA: Diagnosis not present

## 2017-09-15 DIAGNOSIS — R269 Unspecified abnormalities of gait and mobility: Secondary | ICD-10-CM | POA: Diagnosis not present

## 2017-09-15 DIAGNOSIS — Z96641 Presence of right artificial hip joint: Secondary | ICD-10-CM | POA: Diagnosis not present

## 2017-09-15 DIAGNOSIS — M25651 Stiffness of right hip, not elsewhere classified: Secondary | ICD-10-CM | POA: Diagnosis not present

## 2017-09-15 DIAGNOSIS — M87851 Other osteonecrosis, right femur: Secondary | ICD-10-CM | POA: Diagnosis not present

## 2017-09-20 DIAGNOSIS — R269 Unspecified abnormalities of gait and mobility: Secondary | ICD-10-CM | POA: Diagnosis not present

## 2017-09-20 DIAGNOSIS — Z96641 Presence of right artificial hip joint: Secondary | ICD-10-CM | POA: Diagnosis not present

## 2017-09-20 DIAGNOSIS — Z7409 Other reduced mobility: Secondary | ICD-10-CM | POA: Diagnosis not present

## 2017-09-20 DIAGNOSIS — M25651 Stiffness of right hip, not elsewhere classified: Secondary | ICD-10-CM | POA: Diagnosis not present

## 2017-09-20 DIAGNOSIS — M25551 Pain in right hip: Secondary | ICD-10-CM | POA: Diagnosis not present

## 2017-09-20 DIAGNOSIS — M87851 Other osteonecrosis, right femur: Secondary | ICD-10-CM | POA: Diagnosis not present

## 2017-09-22 DIAGNOSIS — M25651 Stiffness of right hip, not elsewhere classified: Secondary | ICD-10-CM | POA: Diagnosis not present

## 2017-09-22 DIAGNOSIS — Z96641 Presence of right artificial hip joint: Secondary | ICD-10-CM | POA: Diagnosis not present

## 2017-09-22 DIAGNOSIS — M87851 Other osteonecrosis, right femur: Secondary | ICD-10-CM | POA: Diagnosis not present

## 2017-09-22 DIAGNOSIS — R269 Unspecified abnormalities of gait and mobility: Secondary | ICD-10-CM | POA: Diagnosis not present

## 2017-09-22 DIAGNOSIS — Z7409 Other reduced mobility: Secondary | ICD-10-CM | POA: Diagnosis not present

## 2017-09-22 DIAGNOSIS — M25551 Pain in right hip: Secondary | ICD-10-CM | POA: Diagnosis not present

## 2017-09-27 DIAGNOSIS — M87851 Other osteonecrosis, right femur: Secondary | ICD-10-CM | POA: Diagnosis not present

## 2017-09-27 DIAGNOSIS — R269 Unspecified abnormalities of gait and mobility: Secondary | ICD-10-CM | POA: Diagnosis not present

## 2017-09-27 DIAGNOSIS — M25651 Stiffness of right hip, not elsewhere classified: Secondary | ICD-10-CM | POA: Diagnosis not present

## 2017-09-27 DIAGNOSIS — Z7409 Other reduced mobility: Secondary | ICD-10-CM | POA: Diagnosis not present

## 2017-09-27 DIAGNOSIS — M25551 Pain in right hip: Secondary | ICD-10-CM | POA: Diagnosis not present

## 2017-09-27 DIAGNOSIS — Z96641 Presence of right artificial hip joint: Secondary | ICD-10-CM | POA: Diagnosis not present

## 2017-09-30 DIAGNOSIS — M25651 Stiffness of right hip, not elsewhere classified: Secondary | ICD-10-CM | POA: Diagnosis not present

## 2017-09-30 DIAGNOSIS — R269 Unspecified abnormalities of gait and mobility: Secondary | ICD-10-CM | POA: Diagnosis not present

## 2017-09-30 DIAGNOSIS — Z96641 Presence of right artificial hip joint: Secondary | ICD-10-CM | POA: Diagnosis not present

## 2017-09-30 DIAGNOSIS — M25551 Pain in right hip: Secondary | ICD-10-CM | POA: Diagnosis not present

## 2017-09-30 DIAGNOSIS — Z7409 Other reduced mobility: Secondary | ICD-10-CM | POA: Diagnosis not present

## 2017-09-30 DIAGNOSIS — M87851 Other osteonecrosis, right femur: Secondary | ICD-10-CM | POA: Diagnosis not present

## 2017-10-04 DIAGNOSIS — R269 Unspecified abnormalities of gait and mobility: Secondary | ICD-10-CM | POA: Diagnosis not present

## 2017-10-04 DIAGNOSIS — M25651 Stiffness of right hip, not elsewhere classified: Secondary | ICD-10-CM | POA: Diagnosis not present

## 2017-10-04 DIAGNOSIS — M25551 Pain in right hip: Secondary | ICD-10-CM | POA: Diagnosis not present

## 2017-10-04 DIAGNOSIS — Z7409 Other reduced mobility: Secondary | ICD-10-CM | POA: Diagnosis not present

## 2017-10-04 DIAGNOSIS — M87851 Other osteonecrosis, right femur: Secondary | ICD-10-CM | POA: Diagnosis not present

## 2017-10-04 DIAGNOSIS — Z96641 Presence of right artificial hip joint: Secondary | ICD-10-CM | POA: Diagnosis not present

## 2017-10-07 DIAGNOSIS — M25551 Pain in right hip: Secondary | ICD-10-CM | POA: Diagnosis not present

## 2017-10-07 DIAGNOSIS — Z7409 Other reduced mobility: Secondary | ICD-10-CM | POA: Diagnosis not present

## 2017-10-07 DIAGNOSIS — R269 Unspecified abnormalities of gait and mobility: Secondary | ICD-10-CM | POA: Diagnosis not present

## 2017-10-07 DIAGNOSIS — M87851 Other osteonecrosis, right femur: Secondary | ICD-10-CM | POA: Diagnosis not present

## 2017-10-07 DIAGNOSIS — Z96641 Presence of right artificial hip joint: Secondary | ICD-10-CM | POA: Diagnosis not present

## 2017-10-07 DIAGNOSIS — M25651 Stiffness of right hip, not elsewhere classified: Secondary | ICD-10-CM | POA: Diagnosis not present

## 2017-10-11 DIAGNOSIS — M25651 Stiffness of right hip, not elsewhere classified: Secondary | ICD-10-CM | POA: Diagnosis not present

## 2017-10-11 DIAGNOSIS — M25551 Pain in right hip: Secondary | ICD-10-CM | POA: Diagnosis not present

## 2017-10-11 DIAGNOSIS — M87851 Other osteonecrosis, right femur: Secondary | ICD-10-CM | POA: Diagnosis not present

## 2017-10-11 DIAGNOSIS — Z7409 Other reduced mobility: Secondary | ICD-10-CM | POA: Diagnosis not present

## 2017-10-11 DIAGNOSIS — Z96641 Presence of right artificial hip joint: Secondary | ICD-10-CM | POA: Diagnosis not present

## 2017-10-11 DIAGNOSIS — R269 Unspecified abnormalities of gait and mobility: Secondary | ICD-10-CM | POA: Diagnosis not present

## 2017-10-14 DIAGNOSIS — Z96641 Presence of right artificial hip joint: Secondary | ICD-10-CM | POA: Diagnosis not present

## 2017-10-14 DIAGNOSIS — Z7409 Other reduced mobility: Secondary | ICD-10-CM | POA: Diagnosis not present

## 2017-10-14 DIAGNOSIS — M25551 Pain in right hip: Secondary | ICD-10-CM | POA: Diagnosis not present

## 2017-10-14 DIAGNOSIS — M25651 Stiffness of right hip, not elsewhere classified: Secondary | ICD-10-CM | POA: Diagnosis not present

## 2017-10-14 DIAGNOSIS — R269 Unspecified abnormalities of gait and mobility: Secondary | ICD-10-CM | POA: Diagnosis not present

## 2017-10-14 DIAGNOSIS — M87851 Other osteonecrosis, right femur: Secondary | ICD-10-CM | POA: Diagnosis not present

## 2017-10-18 DIAGNOSIS — M87851 Other osteonecrosis, right femur: Secondary | ICD-10-CM | POA: Diagnosis not present

## 2017-10-18 DIAGNOSIS — R269 Unspecified abnormalities of gait and mobility: Secondary | ICD-10-CM | POA: Diagnosis not present

## 2017-10-18 DIAGNOSIS — Z96641 Presence of right artificial hip joint: Secondary | ICD-10-CM | POA: Diagnosis not present

## 2017-10-18 DIAGNOSIS — M25551 Pain in right hip: Secondary | ICD-10-CM | POA: Diagnosis not present

## 2017-10-18 DIAGNOSIS — M25651 Stiffness of right hip, not elsewhere classified: Secondary | ICD-10-CM | POA: Diagnosis not present

## 2017-10-18 DIAGNOSIS — Z7409 Other reduced mobility: Secondary | ICD-10-CM | POA: Diagnosis not present

## 2017-10-20 DIAGNOSIS — Z888 Allergy status to other drugs, medicaments and biological substances status: Secondary | ICD-10-CM | POA: Diagnosis not present

## 2017-10-20 DIAGNOSIS — Z96641 Presence of right artificial hip joint: Secondary | ICD-10-CM | POA: Diagnosis not present

## 2017-10-20 DIAGNOSIS — Z471 Aftercare following joint replacement surgery: Secondary | ICD-10-CM | POA: Diagnosis not present

## 2017-10-21 DIAGNOSIS — Z96641 Presence of right artificial hip joint: Secondary | ICD-10-CM | POA: Diagnosis not present

## 2017-10-21 DIAGNOSIS — M25651 Stiffness of right hip, not elsewhere classified: Secondary | ICD-10-CM | POA: Diagnosis not present

## 2017-10-21 DIAGNOSIS — M25551 Pain in right hip: Secondary | ICD-10-CM | POA: Diagnosis not present

## 2017-10-21 DIAGNOSIS — Z7409 Other reduced mobility: Secondary | ICD-10-CM | POA: Diagnosis not present

## 2017-10-21 DIAGNOSIS — M87851 Other osteonecrosis, right femur: Secondary | ICD-10-CM | POA: Diagnosis not present

## 2017-10-21 DIAGNOSIS — R269 Unspecified abnormalities of gait and mobility: Secondary | ICD-10-CM | POA: Diagnosis not present

## 2018-01-06 DIAGNOSIS — Z471 Aftercare following joint replacement surgery: Secondary | ICD-10-CM | POA: Diagnosis not present

## 2018-01-06 DIAGNOSIS — Z888 Allergy status to other drugs, medicaments and biological substances status: Secondary | ICD-10-CM | POA: Diagnosis not present

## 2018-01-06 DIAGNOSIS — Z96643 Presence of artificial hip joint, bilateral: Secondary | ICD-10-CM | POA: Diagnosis not present

## 2018-01-06 DIAGNOSIS — Z96641 Presence of right artificial hip joint: Secondary | ICD-10-CM | POA: Diagnosis not present

## 2018-01-31 DIAGNOSIS — M79645 Pain in left finger(s): Secondary | ICD-10-CM | POA: Diagnosis not present

## 2018-01-31 DIAGNOSIS — M65332 Trigger finger, left middle finger: Secondary | ICD-10-CM | POA: Diagnosis not present

## 2018-02-09 DIAGNOSIS — M65341 Trigger finger, right ring finger: Secondary | ICD-10-CM | POA: Diagnosis not present

## 2018-02-09 DIAGNOSIS — M65332 Trigger finger, left middle finger: Secondary | ICD-10-CM | POA: Diagnosis not present

## 2018-02-10 ENCOUNTER — Other Ambulatory Visit: Payer: Self-pay

## 2018-02-11 MED ORDER — CLONAZEPAM 0.5 MG PO TABS: 0.5000 mg | ORAL_TABLET | Freq: Every evening | ORAL | 3 refills | Status: DC | PRN

## 2018-03-01 ENCOUNTER — Encounter: Payer: BLUE CROSS/BLUE SHIELD | Admitting: Internal Medicine

## 2018-03-16 ENCOUNTER — Other Ambulatory Visit: Payer: Self-pay | Admitting: Internal Medicine

## 2018-03-22 ENCOUNTER — Other Ambulatory Visit: Payer: Self-pay

## 2018-03-22 DIAGNOSIS — Z0000 Encounter for general adult medical examination without abnormal findings: Secondary | ICD-10-CM

## 2018-04-25 ENCOUNTER — Telehealth: Payer: Self-pay

## 2018-04-25 MED ORDER — ESCITALOPRAM OXALATE 10 MG PO TABS: 10.0000 mg | ORAL_TABLET | Freq: Every day | ORAL | 1 refills | Status: DC

## 2018-04-25 MED ORDER — OLMESARTAN MEDOXOMIL-HCTZ 20-12.5 MG PO TABS: 1.0000 | ORAL_TABLET | Freq: Every day | ORAL | 1 refills | Status: DC

## 2018-04-25 MED ORDER — CLONAZEPAM 0.5 MG PO TABS: 0.5000 mg | ORAL_TABLET | Freq: Every evening | ORAL | 1 refills | Status: DC | PRN

## 2018-04-25 MED ORDER — ALBUTEROL SULFATE HFA 108 (90 BASE) MCG/ACT IN AERS: 2.0000 | INHALATION_SPRAY | RESPIRATORY_TRACT | 1 refills | Status: DC | PRN

## 2018-04-25 MED ORDER — FENOFIBRATE 160 MG PO TABS: 160.0000 mg | ORAL_TABLET | Freq: Every day | ORAL | 1 refills | Status: DC

## 2018-04-25 MED ORDER — BECLOMETHASONE DIPROPIONATE 80 MCG/ACT IN AERS: INHALATION_SPRAY | RESPIRATORY_TRACT | 1 refills | Status: DC

## 2018-04-25 MED ORDER — PANTOPRAZOLE SODIUM 40 MG PO TBEC: 40.0000 mg | DELAYED_RELEASE_TABLET | Freq: Every day | ORAL | 1 refills | Status: DC

## 2018-04-25 NOTE — Telephone Encounter (Signed)
RXs sent.

## 2018-04-25 NOTE — Telephone Encounter (Signed)
Copied from [place] [id number]. Topic: Appointment Scheduling - Scheduling Inquiry for Clinic >> Apr 24, 2018  1:14 PM Rutherford Nail, [place] wrote: Reason for CRM: Patient calling and states that he has an appointment on 04/27/2018 and he would like to do a WebEx appointment but is unsure how to get that set up. Please advise. CB#: [phone] Email: [email or web address] >> Apr 24, 2018  1:36 PM [place], Isidoro Donning wrote: This is scheduled as a physical.. please advise.

## 2018-04-25 NOTE — Telephone Encounter (Signed)
Pt states his clonazePAM (KLONOPIN) 0.5 MG tablet Needs to go to  [place], [place] [phone] (Phone) [phone] (Fax)    olmesartan-hydrochlorothiazide (BENICAR HCT) 20-12.5 MG tablet fenofibrate 160 MG tablet pantoprazole (PROTONIX) 40 MG tablet beclomethasone (QVAR) 80 MCG/ACT inhaler escitalopram (LEXAPRO) 10 MG tablet  Send to  [place], [place] - [address] [phone] (Phone) [phone] (Fax)

## 2018-04-27 ENCOUNTER — Encounter: Payer: BLUE CROSS/BLUE SHIELD | Admitting: Internal Medicine

## 2018-06-05 DIAGNOSIS — M65341 Trigger finger, right ring finger: Secondary | ICD-10-CM | POA: Diagnosis not present

## 2018-06-05 DIAGNOSIS — M65332 Trigger finger, left middle finger: Secondary | ICD-10-CM | POA: Diagnosis not present

## 2018-07-06 ENCOUNTER — Encounter: Payer: BLUE CROSS/BLUE SHIELD | Admitting: Internal Medicine

## 2018-08-09 ENCOUNTER — Ambulatory Visit (INDEPENDENT_AMBULATORY_CARE_PROVIDER_SITE_OTHER): Payer: BLUE CROSS/BLUE SHIELD | Admitting: Internal Medicine

## 2018-08-09 ENCOUNTER — Encounter: Payer: Self-pay | Admitting: Internal Medicine

## 2018-08-09 ENCOUNTER — Other Ambulatory Visit (INDEPENDENT_AMBULATORY_CARE_PROVIDER_SITE_OTHER): Payer: BLUE CROSS/BLUE SHIELD

## 2018-08-09 ENCOUNTER — Other Ambulatory Visit: Payer: Self-pay

## 2018-08-09 VITALS — BP 118/76 | HR 90 | Temp 98.2°F | Ht 72.0 in | Wt 216.0 lb

## 2018-08-09 DIAGNOSIS — E785 Hyperlipidemia, unspecified: Secondary | ICD-10-CM

## 2018-08-09 DIAGNOSIS — I10 Essential (primary) hypertension: Secondary | ICD-10-CM

## 2018-08-09 DIAGNOSIS — Z0000 Encounter for general adult medical examination without abnormal findings: Secondary | ICD-10-CM

## 2018-08-09 LAB — BASIC METABOLIC PANEL
BUN: 11 mg/dL (ref 6–23)
CO2: 25 mEq/L (ref 19–32)
Calcium: 9.7 mg/dL (ref 8.4–10.5)
Chloride: 103 mEq/L (ref 96–112)
Creatinine, Ser: 0.82 mg/dL (ref 0.40–1.50)
GFR: 98.04 mL/min (ref >60.00)
Glucose, Bld: 103 mg/dL — ABNORMAL HIGH (ref 70–99)
Potassium: 3.8 mEq/L (ref 3.5–5.1)
Sodium: 138 mEq/L (ref 135–145)

## 2018-08-09 LAB — HEPATIC FUNCTION PANEL
ALT: 81 U/L — ABNORMAL HIGH (ref 0–53)
AST: 86 U/L — ABNORMAL HIGH (ref 0–37)
Albumin: 4.8 g/dL (ref 3.5–5.2)
Alkaline Phosphatase: 58 U/L (ref 39–117)
Bilirubin, Direct: 0.2 mg/dL (ref 0.0–0.3)
Total Bilirubin: 0.6 mg/dL (ref 0.2–1.2)
Total Protein: 7.3 g/dL (ref 6.0–8.3)

## 2018-08-09 LAB — CBC WITH DIFFERENTIAL/PLATELET
Basophils Absolute: 0 10*3/uL (ref 0.0–0.1)
Basophils Relative: 0.7 % (ref 0.0–3.0)
Eosinophils Absolute: 0.3 10*3/uL (ref 0.0–0.7)
Eosinophils Relative: 4.2 % (ref 0.0–5.0)
HCT: 43.4 % (ref 39.0–52.0)
Hemoglobin: 14.9 g/dL (ref 13.0–17.0)
Lymphocytes Relative: 27.3 % (ref 12.0–46.0)
Lymphs Abs: 1.9 10*3/uL (ref 0.7–4.0)
MCHC: 34.3 g/dL (ref 30.0–36.0)
MCV: 92.9 fl (ref 78.0–100.0)
Monocytes Absolute: 0.7 10*3/uL (ref 0.1–1.0)
Monocytes Relative: 10.7 % (ref 3.0–12.0)
Neutro Abs: 4 10*3/uL (ref 1.4–7.7)
Neutrophils Relative %: 57.1 % (ref 43.0–77.0)
Platelets: 198 10*3/uL (ref 150.0–400.0)
RBC: 4.67 Mil/uL (ref 4.22–5.81)
RDW: 13.2 % (ref 11.5–15.5)
WBC: 7 10*3/uL (ref 4.0–10.5)

## 2018-08-09 LAB — TSH: TSH: 1.32 u[IU]/mL (ref 0.35–4.50)

## 2018-08-09 LAB — PSA: PSA: 0.46 ng/mL (ref 0.10–4.00)

## 2018-08-09 LAB — LIPID PANEL
Cholesterol: 219 mg/dL — ABNORMAL HIGH (ref 0–200)
HDL: 45.7 mg/dL (ref >39.00)
NonHDL: 173.54
Total CHOL/HDL Ratio: 5
Triglycerides: 261 mg/dL — ABNORMAL HIGH (ref 0.0–149.0)
VLDL: 52.2 mg/dL — ABNORMAL HIGH (ref 0.0–40.0)

## 2018-08-09 LAB — LDL CHOLESTEROL, DIRECT: Direct LDL: 158 mg/dL

## 2018-08-09 MED ORDER — ESCITALOPRAM OXALATE 10 MG PO TABS: 10.0000 mg | ORAL_TABLET | Freq: Every day | ORAL | 3 refills | Status: DC

## 2018-08-09 MED ORDER — CLONAZEPAM 0.5 MG PO TABS: 0.5000 mg | ORAL_TABLET | Freq: Every evening | ORAL | 1 refills | Status: DC | PRN

## 2018-08-09 MED ORDER — OLMESARTAN MEDOXOMIL-HCTZ 20-12.5 MG PO TABS: 1.0000 | ORAL_TABLET | Freq: Every day | ORAL | 3 refills | Status: DC

## 2018-08-09 MED ORDER — PANTOPRAZOLE SODIUM 40 MG PO TBEC: 40.0000 mg | DELAYED_RELEASE_TABLET | Freq: Every day | ORAL | 3 refills | Status: DC

## 2018-08-09 MED ORDER — ALBUTEROL SULFATE HFA 108 (90 BASE) MCG/ACT IN AERS: 2.0000 | INHALATION_SPRAY | RESPIRATORY_TRACT | 3 refills | Status: DC | PRN

## 2018-08-09 MED ORDER — FENOFIBRATE 160 MG PO TABS: 160.0000 mg | ORAL_TABLET | Freq: Every day | ORAL | 3 refills | Status: DC

## 2018-08-09 MED ORDER — QVAR 80 MCG/ACT IN AERS: INHALATION_SPRAY | RESPIRATORY_TRACT | 3 refills | Status: DC

## 2018-08-09 NOTE — Progress Notes (Signed)
   Subjective:  Patient ID: Darrell Santos, male    DOB: Nov 21, 1965  Age: 53 y.o. MRN: [id number]  CC: No chief complaint on file.   HPI Darrell Santos presents for a well exam  Outpatient Medications Prior to Visit  Medication Sig Dispense Refill  . albuterol (PROAIR HFA) 108 (90 Base) MCG/ACT inhaler Inhale 2 puffs into the lungs every 4 (four) hours as needed for wheezing or shortness of breath. 3 Inhaler 1  . beclomethasone (QVAR) 80 MCG/ACT inhaler INHALE 1 PUFF INTO THE LUNGS TWICE A DAY 3 Inhaler 1  . clonazePAM (KLONOPIN) 0.5 MG tablet Take 1 tablet (0.5 mg total) by mouth at bedtime as needed. 90 tablet 1  . escitalopram (LEXAPRO) 10 MG tablet Take 1 tablet (10 mg total) by mouth daily. 90 tablet 1  . fenofibrate 160 MG tablet Take 1 tablet (160 mg total) by mouth daily. 90 tablet 1  . olmesartan-hydrochlorothiazide (BENICAR HCT) 20-12.5 MG tablet Take 1 tablet by mouth daily. 90 tablet 1  . pantoprazole (PROTONIX) 40 MG tablet Take 1 tablet (40 mg total) by mouth daily. 90 tablet 1   Facility-Administered Medications Prior to Visit  Medication Dose Route Frequency Provider Last Rate Last Dose  . 0.9 %  sodium chloride infusion  500 mL Intravenous Once Nelida Meuse III, MD        ROS: Review of Systems  Objective:  BP 118/76 (BP Location: Left Arm, Patient Position: Sitting, Cuff Size: Large)   Pulse 90   Temp 98.2 F (36.8 C) (Oral)   Ht 6' (1.829 m)   Wt 216 lb (98 kg)   SpO2 97%   BMI 29.29 kg/m   BP Readings from Last 3 Encounters:  08/09/18 118/76  09/01/17 116/78  06/24/17 92/71    Wt Readings from Last 3 Encounters:  08/09/18 216 lb (98 kg)  09/01/17 210 lb (95.3 kg)  06/24/17 211 lb (95.7 kg)    Physical Exam Pt declined rectal Midline hernia upper abd Lab Results  Component Value Date   WBC 7.0 03/18/2017   HGB 15.1 03/18/2017   HCT 44.0 03/18/2017   PLT 195.0 03/18/2017   GLUCOSE 98 03/18/2017   CHOL 182 03/18/2017   TRIG 111.0  03/18/2017   HDL 50.70 03/18/2017   LDLDIRECT 87.0 09/04/2015   LDLCALC 109 (H) 03/18/2017   ALT 59 (H) 03/18/2017   AST 26 03/18/2017   NA 140 03/18/2017   K 4.3 03/18/2017   CL 105 03/18/2017   CREATININE 1.05 03/18/2017   BUN 17 03/18/2017   CO2 27 03/18/2017   TSH 1.51 03/18/2017   PSA 0.49 09/04/2015    No results found.  Assessment & Plan:   There are no diagnoses linked to this encounter.   No orders of the defined types were placed in this encounter.    Follow-up: No follow-ups on file.  Walker Kehr, MD

## 2018-08-09 NOTE — Assessment & Plan Note (Signed)
A cardiac CT scan for calcium scoring offered 

## 2018-08-09 NOTE — Patient Instructions (Signed)
Cardiac CT calcium scoring test $150   Computed tomography, more commonly known as a CT or CAT scan, is a diagnostic medical imaging test. Like traditional x-rays, it produces multiple images or pictures of the inside of the body. The cross-sectional images generated during a CT scan can be reformatted in multiple planes. They can even generate three-dimensional images. These images can be viewed on a computer monitor, printed on film or by a 3D printer, or transferred to a CD or DVD. CT images of internal organs, bones, soft tissue and blood vessels provide greater detail than traditional x-rays, particularly of soft tissues and blood vessels. A cardiac CT scan for coronary calcium is a non-invasive way of obtaining information about the presence, location and extent of calcified plaque in the coronary arteries-the vessels that supply oxygen-containing blood to the heart muscle. Calcified plaque results when there is a build-up of fat and other substances under the inner layer of the artery. This material can calcify which signals the presence of atherosclerosis, a disease of the vessel wall, also called coronary artery disease (CAD). People with this disease have an increased risk for heart attacks. In addition, over time, progression of plaque build up (CAD) can narrow the arteries or even close off blood flow to the heart. The result may be chest pain, sometimes called "angina," or a heart attack. Because calcium is a marker of CAD, the amount of calcium detected on a cardiac CT scan is a helpful prognostic tool. The findings on cardiac CT are expressed as a calcium score. Another name for this test is coronary artery calcium scoring.  What are some common uses of the procedure? The goal of cardiac CT scan for calcium scoring is to determine if CAD is present and to what extent, even if there are no symptoms. It is a screening study that may be recommended by a physician for patients with risk factors  for CAD but no clinical symptoms. The major risk factors for CAD are: . high blood cholesterol levels  . family history of heart attacks  . diabetes  . high blood pressure  . cigarette smoking  . overweight or obese  . physical inactivity   A negative cardiac CT scan for calcium scoring shows no calcification within the coronary arteries. This suggests that CAD is absent or so minimal it cannot be seen by this technique. The chance of having a heart attack over the next two to five years is very low under these circumstances. A positive test means that CAD is present, regardless of whether or not the patient is experiencing any symptoms. The amount of calcification-expressed as the calcium score-may help to predict the likelihood of a myocardial infarction (heart attack) in the coming years and helps your medical doctor or cardiologist decide whether the patient may need to take preventive medicine or undertake other measures such as diet and exercise to lower the risk for heart attack. The extent of CAD is graded according to your calcium score:  Calcium Score  Presence of CAD (coronary artery disease)  0 No evidence of CAD   1-10 Minimal evidence of CAD  11-100 Mild evidence of CAD  101-400 Moderate evidence of CAD  Over 400 Extensive evidence of CAD

## 2018-08-09 NOTE — Assessment & Plan Note (Addendum)
We discussed age appropriate health related issues, including available/recomended screening tests and vaccinations. We discussed a need for adhering to healthy diet and exercise. Labs were ordered to be later reviewed . All questions were answered.  A cardiac CT scan for calcium scoring offered

## 2018-08-10 LAB — URINALYSIS
Bilirubin Urine: NEGATIVE
Hgb urine dipstick: NEGATIVE
Ketones, ur: NEGATIVE
Leukocytes,Ua: NEGATIVE
Nitrite: NEGATIVE
Specific Gravity, Urine: 1.02 (ref 1.000–1.030)
Total Protein, Urine: NEGATIVE
Urine Glucose: NEGATIVE
Urobilinogen, UA: 0.2 (ref 0.0–1.0)
pH: 7 (ref 5.0–8.0)

## 2018-09-30 ENCOUNTER — Ambulatory Visit: Payer: BLUE CROSS/BLUE SHIELD

## 2018-10-06 ENCOUNTER — Ambulatory Visit: Payer: BLUE CROSS/BLUE SHIELD

## 2018-10-06 ENCOUNTER — Other Ambulatory Visit: Payer: Self-pay

## 2018-10-06 ENCOUNTER — Ambulatory Visit (INDEPENDENT_AMBULATORY_CARE_PROVIDER_SITE_OTHER)
Admission: RE | Admit: 2018-10-06 | Discharge: 2018-10-06 | Disposition: A | Discharge status: Home / Self Care | Payer: Self-pay | Source: Ambulatory Visit | Attending: Internal Medicine | Admitting: Internal Medicine

## 2018-10-06 DIAGNOSIS — E785 Hyperlipidemia, unspecified: Secondary | ICD-10-CM

## 2018-10-09 DIAGNOSIS — M65341 Trigger finger, right ring finger: Secondary | ICD-10-CM | POA: Diagnosis not present

## 2018-10-09 DIAGNOSIS — M65332 Trigger finger, left middle finger: Secondary | ICD-10-CM | POA: Diagnosis not present

## 2018-10-11 ENCOUNTER — Encounter: Payer: Self-pay | Admitting: Internal Medicine

## 2018-10-11 DIAGNOSIS — K760 Fatty (change of) liver, not elsewhere classified: Secondary | ICD-10-CM | POA: Insufficient documentation

## 2018-10-12 ENCOUNTER — Other Ambulatory Visit: Payer: Self-pay

## 2018-10-12 ENCOUNTER — Ambulatory Visit (INDEPENDENT_AMBULATORY_CARE_PROVIDER_SITE_OTHER): Payer: BLUE CROSS/BLUE SHIELD

## 2018-10-12 DIAGNOSIS — Z23 Encounter for immunization: Secondary | ICD-10-CM

## 2018-10-13 DIAGNOSIS — Z471 Aftercare following joint replacement surgery: Secondary | ICD-10-CM | POA: Diagnosis not present

## 2018-10-13 DIAGNOSIS — Z96643 Presence of artificial hip joint, bilateral: Secondary | ICD-10-CM | POA: Diagnosis not present

## 2018-12-03 DIAGNOSIS — Z20828 Contact with and (suspected) exposure to other viral communicable diseases: Secondary | ICD-10-CM | POA: Diagnosis not present

## 2018-12-03 DIAGNOSIS — J45901 Unspecified asthma with (acute) exacerbation: Secondary | ICD-10-CM | POA: Diagnosis not present

## 2018-12-03 DIAGNOSIS — R05 Cough: Secondary | ICD-10-CM | POA: Diagnosis not present

## 2019-02-19 ENCOUNTER — Emergency Department
Admission: EM | Admit: 2019-02-19 | Discharge: 2019-02-19 | Disposition: A | Discharge status: Home / Self Care | Payer: BC Managed Care – PPO | Source: Home / Self Care | Attending: Family Medicine | Admitting: Family Medicine

## 2019-02-19 ENCOUNTER — Other Ambulatory Visit: Payer: Self-pay

## 2019-02-19 DIAGNOSIS — R739 Hyperglycemia, unspecified: Secondary | ICD-10-CM | POA: Diagnosis not present

## 2019-02-19 LAB — POCT FASTING CBG KUC MANUAL ENTRY: POCT Glucose (KUC): 447 mg/dL — AB (ref 70–99)

## 2019-02-19 MED ORDER — METFORMIN HCL 500 MG/5ML PO SOLN: ORAL | 1 refills | Status: DC

## 2019-02-19 NOTE — ED Triage Notes (Signed)
Pt states that the last 3 days he has had excessive thirst, increased urination, headache, and fatigue.  Checked blood glucose today and it was 576.  Came in to be checked out.

## 2019-02-19 NOTE — ED Provider Notes (Signed)
[name]    CSN: [id number] Arrival date & time: 02/19/19  1818      History   Chief Complaint Chief Complaint  Patient presents with  . Hyperglycemia    HPI Darrell Santos is a 54 y.o. male.   Patient complains of fatigue for about a week.  During the past 3 days he has had increased thirst and urinary frequency.  He checked his glucose with a family member's glucometer 3 hours ago, resulting in glucose 576mg /dl.  He feels well otherwise and denies recent illness.  No past history of diabetes. He has multiple paternal relatives who have type 2 diabetes. He admits that he has been drinking more alcoholic beverages during the pandemic, including about a pint of whiskey per day.  The history is provided by the patient.    Past Medical History:  Diagnosis Date  . Allergic rhinitis   . Allergy   . Anxiety   . Asthma   . GERD (gastroesophageal reflux disease)   . HTN (hypertension)   . Hyperlipidemia    borderline  . Rosacea     Patient Active Problem List   Diagnosis Date Noted  . Fatty liver 10/11/2018  . Preop exam for internal medicine 09/01/2017  . Foot pain, left 09/02/2016  . Dyslipidemia 09/10/2015  . Restless leg syndrome 09/19/2014  . Rash and nonspecific skin eruption 09/19/2014  . Warts 09/27/2012  . Well adult exam 08/16/2012  . Actinic keratoses 08/16/2012  . Food allergy 08/15/2012  . 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 Diarrhea 06/30/2010  . CHOLELITHIASIS 01/08/2010  . RUQ PAIN 12/29/2009  . ACTINIC KERATOSIS 07/31/2007  . Neoplasm of uncertain behavior of skin 06/21/2007  . ABNORMAL GLUCOSE NEC 06/21/2007  . ABNORMAL LIVER FUNCTION TESTS 06/21/2007  . Anxiety state 11/25/2006  . Essential hypertension 11/25/2006  . ALLERGIC RHINITIS 11/25/2006  . Asthma 11/25/2006  . GERD 11/25/2006    Past Surgical History:  Procedure Laterality Date  . CHOLECYSTECTOMY    . COLONOSCOPY    . NASAL SINUS SURGERY    . TOTAL  HIP ARTHROPLASTY         Home Medications    Prior to Admission medications   Medication Sig Start Date End Date Taking? Authorizing Provider  albuterol (PROAIR HFA) 108 (90 Base) MCG/ACT inhaler Inhale 2 puffs into the lungs every 4 (four) hours as needed for wheezing or shortness of breath. 08/09/18   Plotnikov, Evie Lacks, MD  beclomethasone (QVAR) 80 MCG/ACT inhaler INHALE 1 PUFF INTO THE LUNGS TWICE A DAY 08/09/18   Plotnikov, Evie Lacks, MD  clonazePAM (KLONOPIN) 0.5 MG tablet Take 1 tablet (0.5 mg total) by mouth at bedtime as needed. 08/09/18   Plotnikov, Evie Lacks, MD  escitalopram (LEXAPRO) 10 MG tablet Take 1 tablet (10 mg total) by mouth daily. 08/09/18   Plotnikov, Evie Lacks, MD  fenofibrate 160 MG tablet Take 1 tablet (160 mg total) by mouth daily. 08/09/18   Plotnikov, Evie Lacks, MD  Metformin HCl 500 MG/5ML SOLN Take 41mL PO BID.  Take with food. 02/19/19   Kandra Nicolas, MD  olmesartan-hydrochlorothiazide (BENICAR HCT) 20-12.5 MG tablet Take 1 tablet by mouth daily. 08/09/18   Plotnikov, Evie Lacks, MD  pantoprazole (PROTONIX) 40 MG tablet Take 1 tablet (40 mg total) by mouth daily. 08/09/18   Plotnikov, Evie Lacks, MD    Family History Family History  Problem Relation Age of Onset  . Hypertension Other   . Hypertension Other   .  Diabetes Other        1st degree relative   . Colon polyps Neg Hx   . Colon cancer Neg Hx   . Esophageal cancer Neg Hx   . Rectal cancer Neg Hx   . Stomach cancer Neg Hx     Social History Social History   Tobacco Use  . Smoking status: Never Smoker  . Smokeless tobacco: Former Network engineer Use Topics  . Alcohol use: Yes    Alcohol/week: 14.0 standard drinks    Types: 14 Standard drinks or equivalent per week    Comment: 2/day  . Drug use: No     Allergies   Midazolam, Molds & smuts, Other, and Pollen extract   Review of Systems Review of Systems  Constitutional: Positive for activity change and fatigue. Negative for appetite  change, chills, diaphoresis, fever and unexpected weight change.  HENT: Negative.   Eyes: Negative.   Respiratory: Negative.   Cardiovascular: Negative.   Gastrointestinal: Negative.   Genitourinary: Positive for urgency. Negative for difficulty urinating, discharge, dysuria, flank pain, hematuria and testicular pain.  Musculoskeletal: Negative.   Neurological: Negative.      Physical Exam Triage Vital Signs ED Triage Vitals  Enc Vitals Group     BP 02/19/19 1929 138/86     Pulse Rate 02/19/19 1929 97     Resp 02/19/19 1929 20     Temp 02/19/19 1929 98.4 F (36.9 C)     Temp Source 02/19/19 1929 Oral     SpO2 02/19/19 1929 97 %     Weight 02/19/19 1930 213 lb (96.6 kg)     Height 02/19/19 1930 6' (1.829 m)     Head Circumference --      Peak Flow --      Pain Score 02/19/19 1930 0     Pain Loc --      Pain Edu? --      Excl. in [place]? --    No data found.  Updated Vital Signs BP 138/86 (BP Location: Right Arm)   Pulse 97   Temp 98.4 F (36.9 C) (Oral)   Resp 20   Ht 6' (1.829 m)   Wt 96.6 kg   SpO2 97%   BMI 28.89 kg/m   Visual Acuity Right Eye Distance:   Left Eye Distance:   Bilateral Distance:    Right Eye Near:   Left Eye Near:    Bilateral Near:     Physical Exam Nursing notes and Vital Signs reviewed. Appearance:  Patient appears stated age, and in no acute distress Eyes:  Pupils are equal, round, and reactive to light and accomodation.  Extraocular movement is intact.  Conjunctivae are not inflamed  Ears:  Normal externally Nose:   Normal turbinates.  Pharynx:  Normal Neck:  Supple.  No adenopathy Lungs:  Clear to auscultation.  Breath sounds are equal.  Moving air well. Heart:  Regular rate and rhythm without murmurs, rubs, or gallops.  Abdomen:  Nontender without masses or hepatosplenomegaly.  Bowel sounds are present.  No CVA or flank tenderness.  Extremities:  No edema.  Skin:  No rash present.   UC Treatments / Results  Labs (all labs  ordered are listed, but only abnormal results are displayed) Labs Reviewed  POCT FASTING CBG [facility] MANUAL ENTRY - Abnormal; Notable for the following components:      Result Value   POCT Glucose ([facility]) 447 (*)    All other components within normal limits  HEMOGLOBIN A1C    EKG   Radiology No results found.  Procedures Procedures (including critical care time)  Medications Ordered in UC Medications - No data to display  Initial Impression / Assessment and Plan / UC Course  I have reviewed the triage vital signs and the nursing notes.  Pertinent labs & imaging results that were available during my care of the patient were reviewed by me and considered in my medical decision making (see chart for details).    Begin metformin 500mg  BID with food (patient prefers solution).  Hgb A1C pending.  Followup with Family Doctor as soon as possible.    Final Clinical Impressions(s) / UC Diagnoses   Final diagnoses:  Hyperglycemia     Discharge Instructions     Monitor glucose twice daily.  Decrease alcohol intake.   If symptoms become significantly worse during the night or over the weekend, proceed to the local emergency room.     ED Prescriptions    Medication Sig Dispense Auth. Provider   Metformin HCl 500 MG/5ML SOLN Take 86mL PO BID.  Take with food. 118 mL Kandra Nicolas, MD        Kandra Nicolas, MD 02/21/19 1254

## 2019-02-19 NOTE — Discharge Instructions (Addendum)
Monitor glucose twice daily.  Decrease alcohol intake.   If symptoms become significantly worse during the night or over the weekend, proceed to the local emergency room.

## 2019-02-20 ENCOUNTER — Telehealth: Payer: Self-pay

## 2019-02-20 MED ORDER — METFORMIN HCL 500 MG PO TABS: ORAL_TABLET | ORAL | 1 refills | Status: DC

## 2019-02-20 NOTE — Telephone Encounter (Signed)
Will write Rx for metformin 500mg  BID (may crush tab and add to small amount of food). #30, 1 refill

## 2019-02-21 ENCOUNTER — Ambulatory Visit (INDEPENDENT_AMBULATORY_CARE_PROVIDER_SITE_OTHER): Payer: BC Managed Care – PPO | Admitting: Internal Medicine

## 2019-02-21 ENCOUNTER — Encounter: Payer: Self-pay | Admitting: Internal Medicine

## 2019-02-21 DIAGNOSIS — E1165 Type 2 diabetes mellitus with hyperglycemia: Secondary | ICD-10-CM | POA: Diagnosis not present

## 2019-02-21 DIAGNOSIS — Z7289 Other problems related to lifestyle: Secondary | ICD-10-CM

## 2019-02-21 DIAGNOSIS — I10 Essential (primary) hypertension: Secondary | ICD-10-CM

## 2019-02-21 DIAGNOSIS — F1090 Alcohol use, unspecified, uncomplicated: Secondary | ICD-10-CM | POA: Insufficient documentation

## 2019-02-21 DIAGNOSIS — Z789 Other specified health status: Secondary | ICD-10-CM | POA: Insufficient documentation

## 2019-02-21 LAB — HEMOGLOBIN A1C
Hgb A1c MFr Bld: 9.4 % of total Hgb — ABNORMAL HIGH (ref <5.7)
Mean Plasma Glucose: 223 (calc)
eAG (mmol/L): 12.4 (calc)

## 2019-02-21 MED ORDER — METFORMIN HCL 500 MG PO TABS: ORAL_TABLET | ORAL | 11 refills | Status: DC

## 2019-02-21 MED ORDER — REPAGLINIDE 1 MG PO TABS: 1.0000 mg | ORAL_TABLET | Freq: Three times a day (TID) | ORAL | 11 refills | Status: DC

## 2019-02-21 NOTE — Assessment & Plan Note (Signed)
Reduce alcohol to 1-2 drinks/night

## 2019-02-21 NOTE — Assessment & Plan Note (Signed)
1/21 new onset DM2 vs IDDM  He was checking CBGs at home >400. Went to UC on 1/25 - glu=432. C/o thirst, frequent urination x weeks. C/o wt gain in isolation; drinking 1/2 pint of bourbon and 38 oz beer a day lately.  Start Prandin. Cont Metformin 1/2 tab bid - unable to take larger tablets. Cut back on alcohol. Endo ref

## 2019-02-21 NOTE — Progress Notes (Signed)
Virtual Visit via Video Note  I connected with Darrell Santos on 02/21/19 at 10:40 AM EST by a video enabled telemedicine application and verified that I am speaking with the correct person using two identifiers.   I discussed the limitations of evaluation and management by telemedicine and the availability of in person appointments. The patient expressed understanding and agreed to proceed.  History of Present Illness: Darrell Santos presents for a new onset DM. He was checking CBGs at home >400. Went to UC on 1/25 - glu=432. C/o thirst, frequent urination x weeks. C/o wt gain in isolation; drinking 1/2 pint of bourbon and 38 oz beer a day lately. C/o stress over COVID.  There has been no runny nose, cough, chest pain, shortness of breath, abdominal pain, diarrhea, constipation, arthralgias, skin rashes.   Observations/Objective: The patient appears to be in no acute distress  Assessment and Plan:  See my Assessment and Plan.  A complex case   Follow Up Instructions:    I discussed the assessment and treatment plan with the patient. The patient was provided an opportunity to ask questions and all were answered. The patient agreed with the plan and demonstrated an understanding of the instructions.   The patient was advised to call back or seek an in-person evaluation if the symptoms worsen or if the condition fails to improve as anticipated.  I provided face-to-face time during this encounter. We were at different locations.   Walker Kehr, MD

## 2019-02-21 NOTE — Assessment & Plan Note (Signed)
Benicar HCT 

## 2019-03-06 NOTE — Progress Notes (Signed)
Name: Darrell Santos  MRN/ DOB: [id number], Jun 18, 1965   Age/ Sex: 54 y.o., male    PCP: Plotnikov, Evie Lacks, MD   Reason for Endocrinology Evaluation: Type 2 Diabetes Mellitus     Date of Initial Endocrinology Visit: 03/08/2019     PATIENT IDENTIFIER: Darrell Santos is a 54 y.o. male with a past medical history of HTN and T2DM . The patient presented for initial endocrinology clinic visit on 03/08/2019 for consultative assistance with his diabetes management.    HPI: [name] was    Pt was recently diagnosed with T2DM, he was having symptomatic hyperglycemia with polyuria and polydipsia. He was drinking 1/2 pint of bourbon and 38 ox of beer at the time. He presented to the ED with a glucose of >200 mg/dL , he was discharged on Metformin.   Currently checking blood sugars 1 x / day Hypoglycemia episodes : no             Hemoglobin A1c 9.4% in 2021 Patient required assistance for hypoglycemia: no  Patient has required hospitalization within the last 1 year from hyper or hypoglycemia: yes 01/2019 for hyperglycemia   In terms of diet, the patient eats 3 meals a day, does not snack. Used to drink sugar-sweetened beverages.    HOME DIABETES REGIMEN: Metformin 500 mg BID  Repaglinide 1 mg TID    Statin: no ACE-I/ARB: Yes Prior Diabetic Education: no   GLUCOSE LOG: 75-233 mg/dL   DIABETIC COMPLICATIONS: Microvascular complications:    Denies: CKD  Last eye exam: Completed 02/2018  Macrovascular complications:    Denies: CAD, PVD, CVA   PAST HISTORY: Past Medical History:  Past Medical History:  Diagnosis Date  . Allergic rhinitis   . Allergy   . Anxiety   . Asthma   . GERD (gastroesophageal reflux disease)   . HTN (hypertension)   . Hyperlipidemia    borderline  . Rosacea    Past Surgical History:  Past Surgical History:  Procedure Laterality Date  . CHOLECYSTECTOMY    . COLONOSCOPY    . NASAL SINUS SURGERY    . TOTAL HIP ARTHROPLASTY         Social History:  reports that he has never smoked. He quit smokeless tobacco use about 21 years ago. He reports current alcohol use of about 14.0 standard drinks of alcohol per week. He reports that he does not use drugs. Family History:  Family History  Problem Relation Age of Onset  . Hypertension Other   . Hypertension Other   . Diabetes Other        1st degree relative   . Colon polyps Neg Hx   . Colon cancer Neg Hx   . Esophageal cancer Neg Hx   . Rectal cancer Neg Hx   . Stomach cancer Neg Hx      HOME MEDICATIONS: Allergies as of 03/07/2019      Reactions   Midazolam Shortness Of Breath   (Pre-op)- "dificulty breathing with associated heaviness in chest"    Molds & Smuts Shortness Of Breath   Other Anaphylaxis   Cats and nuts   Pollen Extract       Medication List       Accurate as of March 07, 2019 11:59 PM. If you have any questions, ask your nurse or doctor.        albuterol 108 (90 Base) MCG/ACT inhaler Commonly known as: ProAir HFA Inhale 2 puffs into the lungs every 4 (four)  hours as needed for wheezing or shortness of breath.   clonazePAM 0.5 MG tablet Commonly known as: KLONOPIN Take 1 tablet (0.5 mg total) by mouth at bedtime as needed.   escitalopram 10 MG tablet Commonly known as: LEXAPRO Take 1 tablet (10 mg total) by mouth daily.   fenofibrate 160 MG tablet Take 1 tablet (160 mg total) by mouth daily.   metFORMIN 500 MG tablet Commonly known as: GLUCOPHAGE Take 0.5 tab PO BID with a meal   olmesartan-hydrochlorothiazide 20-12.5 MG tablet Commonly known as: BENICAR HCT Take 1 tablet by mouth daily.   pantoprazole 40 MG tablet Commonly known as: PROTONIX Take 1 tablet (40 mg total) by mouth daily.   Qvar 80 MCG/ACT inhaler Generic drug: beclomethasone INHALE 1 PUFF INTO THE LUNGS TWICE A DAY   repaglinide 0.5 MG tablet Commonly known as: PRANDIN Take 1 tablet (0.5 mg total) by mouth 3 (three) times daily before  meals. What changed:   medication strength  how much to take Changed by: Dorita Sciara, MD        ALLERGIES: Allergies  Allergen Reactions  . Midazolam Shortness Of Breath    (Pre-op)- "dificulty breathing with associated heaviness in chest"   . Molds & Smuts Shortness Of Breath  . Other Anaphylaxis    Cats and nuts  . Pollen Extract      REVIEW OF SYSTEMS: A comprehensive ROS was conducted with the patient and is negative except as per HPI and below:  Review of Systems  Neurological: Negative for tingling and tremors.      OBJECTIVE:   VITAL SIGNS: BP 132/78 (BP Location: Left Arm, Patient Position: Sitting, Cuff Size: Normal)   Pulse 88   Temp 97.7 F (36.5 C)   Ht 6' (1.829 m)   Wt 212 lb 3.2 oz (96.3 kg)   SpO2 98%   BMI 28.78 kg/m    PHYSICAL EXAM:  General: Pt appears well and is in NAD  Neck: General: Supple without adenopathy or carotid bruits. Thyroid: Thyroid size normal.  No goiter or nodules appreciated. No thyroid bruit.  Lungs: Clear with good BS bilat with no rales, rhonchi, or wheezes  Heart: RRR with normal S1 and S2 and no gallops; no murmurs; no rub  Abdomen: Normoactive bowel sounds, soft, nontender, without masses or organomegaly palpable  Extremities:  Lower extremities - No pretibial edema. No lesions.  Skin: Normal texture and temperature to palpation. No rash noted. No Acanthosis nigricans/skin tags. No lipohypertrophy.  Neuro: MS is good with appropriate affect, pt is alert and Ox3    DM foot exam: 03/07/2019  The skin of the feet is intact without sores or ulcerations, pt with callous formation at the medial aspect of the 1st MT joint The pedal pulses are 2+ on right and 2+ on left. The sensation is intact to a screening 5.07, 10 gram monofilament bilaterally   DATA REVIEWED:  Lab Results  Component Value Date   HGBA1C 9.4 (H) 02/19/2019   Lab Results  Component Value Date   LDLCALC 109 (H) 03/18/2017    CREATININE 0.82 08/09/2018   No results found for: [facility]  Lab Results  Component Value Date   CHOL 219 (H) 08/09/2018   HDL 45.70 08/09/2018   LDLCALC 109 (H) 03/18/2017   LDLDIRECT 158.0 08/09/2018   TRIG 261.0 (H) 08/09/2018   CHOLHDL 5 08/09/2018        ASSESSMENT / PLAN / RECOMMENDATIONS:   1) Type 2 Diabetes Mellitus, Newly  Diagnosed, Without complications - Most recent A1c of 9.4 %. Goal A1c < 7.0 %.    Plan: GENERAL: I have discussed with the patient the pathophysiology of diabetes. We went over the natural progression of the disease. We talked about both insulin resistance and insulin deficiency. We stressed the importance of lifestyle changes including diet and exercise. I explained the complications associated with diabetes including retinopathy, nephropathy, neuropathy as well as increased risk of cardiovascular disease. We went over the benefit seen with glycemic control.   I explained to the patient that diabetic patients are at higher than normal risk for amputations.   I have advised the patient to avoid sugar sweetened beverages, we also discussed trying to avoid snacks when possible, he agreed to see our RD.  I have encouraged him to continue to reduce his EtOH consumption, we discussed the effects of alcohol on pancreatic function.  In review of his glucose log he had a BG of 74 mg per DL during the day, patient also reports episodes of feeling shaky and jittery hence will reduce his repaglinide as below.  He is unable to swallow a full tablet of Metformin at the time, he has been swallowing half tabs, his try to get the liquid but it is very costly.  MEDICATIONS:  Increase metformin to 2 tablets with breakfast and 2 tablets with supper-titration provided  Decrease repaglinide to 0.5 mg 3 times daily before meals  EDUCATION / INSTRUCTIONS:  BG monitoring instructions: Patient is instructed to check his blood sugars 1-3 times a day, before  meals.  Call [facility] if: BG persistently < 70 or > 300. . I reviewed the Rule of 15 for the treatment of hypoglycemia in detail with the patient. Literature supplied.   2) Diabetic complications:   Eye: Does not have known diabetic retinopathy.  Patient urged to have a diabetic eye exam  Neuro/ Feet: Does not have known diabetic peripheral neuropathy.  Renal: Patient does not have known baseline CKD. He is on an ACEI/ARB at present.  3) Lipids: Patient is not on a statin.  He is on fenofibrate.  We discussed the cardiovascular benefits of statins in people with a recent diagnosis of diabetes mellitus.  We will discuss this again on the next visit.   4) Hypertension: He is  at goal of < 140/90 mmHg.   Follow-up in 3 months    Signed electronically by: Mack Guise, MD  [facility]  [facility] [address] [place], [place] [address] Phone: [phone] FAX: [phone]   CC: Cassandria Anger, MD [place] [address] Phone: [phone]  Fax: [phone]    Return to Endocrinology clinic as below: Future Appointments  Date Time Provider [place]  04/12/2019  3:00 PM Clydell Hakim, RD [place] [facility]  06/05/2019  3:40 PM Sarahi Borland, Melanie Crazier, MD [facility] None  08/13/2019  4:00 PM Plotnikov, Evie Lacks, MD [facility] None

## 2019-03-07 ENCOUNTER — Ambulatory Visit: Payer: BC Managed Care – PPO | Admitting: Internal Medicine

## 2019-03-07 ENCOUNTER — Encounter: Payer: Self-pay | Admitting: Internal Medicine

## 2019-03-07 ENCOUNTER — Other Ambulatory Visit: Payer: Self-pay

## 2019-03-07 VITALS — BP 132/78 | HR 88 | Temp 97.7°F | Ht 72.0 in | Wt 212.2 lb

## 2019-03-07 DIAGNOSIS — E785 Hyperlipidemia, unspecified: Secondary | ICD-10-CM

## 2019-03-07 DIAGNOSIS — E1165 Type 2 diabetes mellitus with hyperglycemia: Secondary | ICD-10-CM

## 2019-03-07 MED ORDER — REPAGLINIDE 0.5 MG PO TABS: 0.5000 mg | ORAL_TABLET | Freq: Three times a day (TID) | ORAL | 1 refills | Status: DC

## 2019-03-07 NOTE — Patient Instructions (Addendum)
-   Increase Metformin to 2 tablets with Breakfast and 1 tablet with supper for 2 weeks, if no vomiting or diarrhea, please increase to 2 tablets with Breakfast and 2 tablets with supper  - Decrease Repaglinide to 0.5 mg ,before Breakfast, lunch and dinner     Choose healthy, lower carb lower calorie snacks: toss salad, vegetables, cottage cheese, peanut butter, low fat cheese / string cheese, lower sodium deli meat, tuna salad or chicken salad      HOW TO TREAT LOW BLOOD SUGARS (Blood sugar LESS THAN 70 MG/DL)  Please follow the RULE OF 15 for the treatment of hypoglycemia treatment (when your (blood sugars are less than 70 mg/dL)    STEP 1: Take 15 grams of carbohydrates when your blood sugar is low, which includes:   3-4 GLUCOSE TABS  OR  3-4 OZ OF JUICE OR REGULAR SODA OR  ONE TUBE OF GLUCOSE GEL     STEP 2: RECHECK blood sugar in 15 MINUTES STEP 3: If your blood sugar is still low at the 15 minute recheck --> then, go back to STEP 1 and treat AGAIN with another 15 grams of carbohydrates.

## 2019-03-08 DIAGNOSIS — E1165 Type 2 diabetes mellitus with hyperglycemia: Secondary | ICD-10-CM | POA: Insufficient documentation

## 2019-03-08 LAB — MICROALBUMIN / CREATININE URINE RATIO
Creatinine,U: 153.4 mg/dL
Microalb Creat Ratio: 0.5 mg/g (ref 0.0–30.0)
Microalb, Ur: 0.8 mg/dL (ref 0.0–1.9)

## 2019-03-14 ENCOUNTER — Encounter: Payer: Self-pay | Admitting: Internal Medicine

## 2019-03-14 ENCOUNTER — Other Ambulatory Visit: Payer: Self-pay | Admitting: Internal Medicine

## 2019-03-14 MED ORDER — METFORMIN HCL 500 MG PO TABS: 1000.0000 mg | ORAL_TABLET | Freq: Two times a day (BID) | ORAL | 0 refills | Status: DC

## 2019-03-14 MED ORDER — METFORMIN HCL 500 MG PO TABS: 1000.0000 mg | ORAL_TABLET | Freq: Two times a day (BID) | ORAL | 3 refills | Status: DC

## 2019-03-16 ENCOUNTER — Other Ambulatory Visit: Payer: Self-pay

## 2019-03-16 ENCOUNTER — Telehealth: Payer: Self-pay | Admitting: Internal Medicine

## 2019-03-16 DIAGNOSIS — E1165 Type 2 diabetes mellitus with hyperglycemia: Secondary | ICD-10-CM

## 2019-03-16 MED ORDER — REPAGLINIDE 0.5 MG PO TABS: 0.5000 mg | ORAL_TABLET | Freq: Three times a day (TID) | ORAL | 0 refills | Status: DC

## 2019-03-16 MED ORDER — METFORMIN HCL 500 MG PO TABS: 1000.0000 mg | ORAL_TABLET | Freq: Two times a day (BID) | ORAL | 3 refills | Status: DC

## 2019-03-16 NOTE — Telephone Encounter (Signed)
Spoke with [facility] and provided clarification.

## 2019-03-16 NOTE — Telephone Encounter (Signed)
Randall Hiss with [facility] requests to be called at ph# [phone], Reference# [id number] re: clarify allergies of patient that were sent with the RX for Metformin

## 2019-04-12 ENCOUNTER — Ambulatory Visit: Payer: BC Managed Care – PPO | Admitting: Dietician

## 2019-04-13 ENCOUNTER — Other Ambulatory Visit: Payer: Self-pay | Admitting: Internal Medicine

## 2019-04-20 ENCOUNTER — Ambulatory Visit: Payer: BC Managed Care – PPO | Attending: Internal Medicine

## 2019-04-20 DIAGNOSIS — Z23 Encounter for immunization: Secondary | ICD-10-CM

## 2019-04-20 NOTE — Progress Notes (Signed)
   Covid-19 Vaccination Clinic  Name:  Darrell Santos    MRN: [id number] DOB: [birth year]  04/20/2019  [name] was observed post Covid-19 immunization for 30 minutes based on pre-vaccination screening without incident. He was provided with Vaccine Information Sheet and instruction to access the V-Safe system.   [name] was instructed to call 911 with any severe reactions post vaccine: Marland Kitchen Difficulty breathing  . Swelling of face and throat  . A fast heartbeat  . A bad rash all over body  . Dizziness and weakness   Immunizations Administered    Name Date Dose VIS Date Route   Pfizer COVID-19 Vaccine 04/20/2019  2:30 PM 0.3 mL 01/05/2019 Intramuscular   Manufacturer: [place]   Lot: [id number]   [place]: [id number]

## 2019-04-26 ENCOUNTER — Other Ambulatory Visit: Payer: Self-pay

## 2019-04-26 ENCOUNTER — Encounter: Payer: BC Managed Care – PPO | Attending: Internal Medicine | Admitting: Dietician

## 2019-04-26 ENCOUNTER — Encounter: Payer: Self-pay | Admitting: Dietician

## 2019-04-26 DIAGNOSIS — E1165 Type 2 diabetes mellitus with hyperglycemia: Secondary | ICD-10-CM | POA: Diagnosis not present

## 2019-04-26 NOTE — Progress Notes (Signed)
Diabetes Self-Management Education  Visit Type: First/Initial  Appt. Start Time: 1555 Appt. End Time: 1700  04/30/2019  [name], identified by name and date of birth, is a 54 y.o. male with a diagnosis of Diabetes: Type 2.   ASSESSMENT  Weight 204 lb (92.5 kg). Body mass index is 27.67 kg/m.  Diabetes Self-Management Education - 04/26/19 1559      Visit Information   Visit Type  First/Initial      Initial Visit   Diabetes Type  Type 2    Are you currently following a meal plan?  Yes    What type of meal plan do you follow?  low carb/low sugar    Are you taking your medications as prescribed?  Yes    Date Diagnosed  01/2019      Health Coping   How would you rate your overall health?  Good      Psychosocial Assessment   Patient Belief/Attitude about Diabetes  Motivated to manage diabetes    Self-care barriers  None    Self-management support  Doctor's office    Other persons present  Patient    Patient Concerns  Nutrition/Meal planning;Glycemic Control    Special Needs  None    Preferred Learning Style  No preference indicated    Learning Readiness  Ready    How often do you need to have someone help you when you read instructions, pamphlets, or other written materials from your doctor or pharmacy?  1 - Never    What is the last grade level you completed in school?  BS degree      Pre-Education Assessment   Patient understands the diabetes disease and treatment process.  Needs Instruction    Patient understands incorporating nutritional management into lifestyle.  Needs Instruction    Patient undertands incorporating physical activity into lifestyle.  Needs Instruction    Patient understands using medications safely.  Needs Instruction    Patient understands monitoring blood glucose, interpreting and using results  Needs Instruction    Patient understands prevention, detection, and treatment of acute complications.  Needs Instruction    Patient understands  prevention, detection, and treatment of chronic complications.  Needs Instruction    Patient understands how to develop strategies to address psychosocial issues.  Needs Instruction    Patient understands how to develop strategies to promote health/change behavior.  Needs Instruction      Complications   Last HgB A1C per patient/outside source  9.4 %   02/19/2019   How often do you check your blood sugar?  1-2 times/day    Fasting Blood glucose range (mg/dL)  70-129    Number of hypoglycemic episodes per month  4    Can you tell when your blood sugar is low?  Yes    What do you do if your blood sugar is low?  drinks 1/2 cup juice    Number of hyperglycemic episodes per week  0    Have you had a dilated eye exam in the past 12 months?  No   has one scheduled   Have you had a dental exam in the past 12 months?  Yes    Are you checking your feet?  No      Dietary Intake   Breakfast  Triple Zero yogurt, fesh fruit, coffee with 1/2 tsp sugar, half and half OR 1-2 eggs, Dave's killer bagel, occasional canadian bacon    Lunch  salad with meat OR meat, vegetable, fruit  Dinner  [place] last night OR meat, vegetable, fruit OR spaghetti and salad    Snack (evening)  pork skins - used to eat potato chips    Beverage(s)  water, 3-4 (bourban and coke daily)  decreased to 12 oz regular soda daily      Exercise   Exercise Type  Light (walking / raking leaves)    How many days per week to you exercise?  5   elyptical   How many minutes per day do you exercise?  10    Total minutes per week of exercise  50      Patient Education   Previous Diabetes Education  No    Disease state   Definition of diabetes, type 1 and 2, and the diagnosis of diabetes;Factors that contribute to the development of diabetes    Nutrition management   Role of diet in the treatment of diabetes and the relationship between the three main macronutrients and blood glucose level;Food label reading, portion sizes and measuring  food.;Carbohydrate counting;Meal options for control of blood glucose level and chronic complications.    Physical activity and exercise   Role of exercise on diabetes management, blood pressure control and cardiac health.    Medications  Reviewed patients medication for diabetes, action, purpose, timing of dose and side effects.    Monitoring  Taught/discussed recording of test results and interpretation of SMBG.;Identified appropriate SMBG and/or A1C goals.;Daily foot exams;Yearly dilated eye exam    Acute complications  Taught treatment of hypoglycemia - the 15 rule.    Chronic complications  Relationship between chronic complications and blood glucose control;Assessed and discussed foot care and prevention of foot problems;Dental care;Identified and discussed with patient  current chronic complications;Retinopathy and reason for yearly dilated eye exams    Psychosocial adjustment  Worked with patient to identify barriers to care and solutions;Role of stress on diabetes;Identified and addressed patients feelings and concerns about diabetes    Personal strategies to promote health  Lifestyle issues that need to be addressed for better diabetes care      Individualized Goals (developed by patient)   Nutrition  General guidelines for healthy choices and portions discussed    Physical Activity  Exercise 5-7 days per week;30 minutes per day    Medications  take my medication as prescribed    Monitoring   test my blood glucose as discussed    Reducing Risk  increase portions of healthy fats;examine blood glucose patterns;Other (comment)   decrease alcohol intake   Health Coping  discuss diabetes with (comment)   MD, RD, CDE     Post-Education Assessment   Patient understands the diabetes disease and treatment process.  Demonstrates understanding / competency    Patient understands incorporating nutritional management into lifestyle.  Demonstrates understanding / competency    Patient undertands  incorporating physical activity into lifestyle.  Demonstrates understanding / competency    Patient understands using medications safely.  Demonstrates understanding / competency    Patient understands monitoring blood glucose, interpreting and using results  Demonstrates understanding / competency    Patient understands prevention, detection, and treatment of acute complications.  Demonstrates understanding / competency    Patient understands prevention, detection, and treatment of chronic complications.  Demonstrates understanding / competency    Patient understands how to develop strategies to address psychosocial issues.  Demonstrates understanding / competency    Patient understands how to develop strategies to promote health/change behavior.  Demonstrates understanding / competency  Outcomes   Expected Outcomes  Demonstrated interest in learning. Expect positive outcomes    Future DMSE  PRN    Program Status  Completed       Individualized Plan for Diabetes Self-Management Training:   Learning Objective:  Patient will have a greater understanding of diabetes self-management. Patient education plan is to attend individual and/or group sessions per assessed needs and concerns.   Plan:   Patient Instructions  Plan:  Aim for 3-4 Carb Choices per meal (45-60 grams) +/- 1 either way  Aim for 0-1 Carbs per snack if hungry.  Aim for snacks without carbs if you are hungry. Include protein in moderation with your meals and snacks Consider reading food labels for Total Carbohydrate of foods Consider  increasing your activity level by walking or other  for 30 minutes daily as tolerated Continue checking BG at alternate times per day  Continue taking medication as directed by MD      Expected Outcomes:  Demonstrated interest in learning. Expect positive outcomes  Education material provided: ADA - How to Thrive: A Guide for Your Journey with Diabetes, Food label handouts, Meal plan  card and Snack sheet  If problems or questions, patient to contact team via:  Phone and Email  Future DSME appointment: PRN

## 2019-04-26 NOTE — Patient Instructions (Addendum)
Plan:  Aim for 3-4 Carb Choices per meal (45-60 grams) +/- 1 either way  Aim for 0-1 Carbs per snack if hungry.  Aim for snacks without carbs if you are hungry. Include protein in moderation with your meals and snacks Consider reading food labels for Total Carbohydrate of foods Consider  increasing your activity level by walking or other  for 30 minutes daily as tolerated Continue checking BG at alternate times per day  Continue taking medication as directed by MD

## 2019-05-10 DIAGNOSIS — Z20822 Contact with and (suspected) exposure to covid-19: Secondary | ICD-10-CM | POA: Diagnosis not present

## 2019-05-10 DIAGNOSIS — R509 Fever, unspecified: Secondary | ICD-10-CM | POA: Diagnosis not present

## 2019-05-16 ENCOUNTER — Ambulatory Visit: Payer: BC Managed Care – PPO | Attending: Internal Medicine

## 2019-05-16 DIAGNOSIS — Z23 Encounter for immunization: Secondary | ICD-10-CM

## 2019-05-16 NOTE — Progress Notes (Signed)
   Covid-19 Vaccination Clinic  Name:  Exzavior Kunis    MRN: [id number] DOB: 1965/07/26  05/16/2019  Mr. Guernsey was observed post Covid-19 immunization for 15 minutes without incident. He was provided with Vaccine Information Sheet and instruction to access the V-Safe system.   Mr. Mania was instructed to call 911 with any severe reactions post vaccine: Marland Kitchen Difficulty breathing  . Swelling of face and throat  . A fast heartbeat  . A bad rash all over body  . Dizziness and weakness   Immunizations Administered    Name Date Dose VIS Date Route   Pfizer COVID-19 Vaccine 05/16/2019  2:02 PM 0.3 mL 03/21/2018 Intramuscular   Manufacturer: [place]   Lot: [id number]   [place]: [id number]

## 2019-05-30 LAB — HM DIABETES EYE EXAM

## 2019-06-05 ENCOUNTER — Ambulatory Visit: Payer: BC Managed Care – PPO | Admitting: Internal Medicine

## 2019-06-05 ENCOUNTER — Other Ambulatory Visit: Payer: Self-pay

## 2019-06-05 ENCOUNTER — Encounter: Payer: Self-pay | Admitting: Internal Medicine

## 2019-06-05 VITALS — BP 118/72 | HR 65 | Temp 98.7°F | Ht 72.0 in | Wt 197.6 lb

## 2019-06-05 DIAGNOSIS — E119 Type 2 diabetes mellitus without complications: Secondary | ICD-10-CM | POA: Diagnosis not present

## 2019-06-05 DIAGNOSIS — E785 Hyperlipidemia, unspecified: Secondary | ICD-10-CM | POA: Diagnosis not present

## 2019-06-05 LAB — GLUCOSE, POCT (MANUAL RESULT ENTRY): POC Glucose: 120 mg/dl — AB (ref 70–99)

## 2019-06-05 LAB — POCT GLYCOSYLATED HEMOGLOBIN (HGB A1C): Hemoglobin A1C: 5.4 % (ref 4.0–5.6)

## 2019-06-05 MED ORDER — METFORMIN HCL 500 MG PO TABS: 1000.0000 mg | ORAL_TABLET | Freq: Two times a day (BID) | ORAL | 3 refills | Status: DC

## 2019-06-05 MED ORDER — METFORMIN HCL 500 MG PO TABS: ORAL_TABLET | ORAL | 3 refills | Status: DC

## 2019-06-05 NOTE — Progress Notes (Signed)
Name: Darrell Santos  Age/ Sex: 54 y.o., male   MRN/ DOB: [id number], 1965-11-22     PCP: Cassandria Anger, MD   Reason for Endocrinology Evaluation: Type 2 Diabetes Mellitus  Initial Endocrine Consultative Visit: 03/07/2019    PATIENT IDENTIFIER: Darrell Santos is a 54 y.o. male with a past medical history of HTN and T2DM. The patient has followed with Endocrinology clinic since 03/07/2019 for consultative assistance with management of his diabetes.  DIABETIC HISTORY:  Darrell Santos was diagnosed with T2DM in 01/2019, he presented to the ED with symptomatic hyperglycemia . He was discharged on Metformin. He was on Repaglinide for a short period of time but was discontinued in 02/2019 due to hypoglycemia.His hemoglobin A1c has ranged from 5.6% in 05/2019, peaking at 9.4% in 02/2019.   SUBJECTIVE:   During the last visit (03/07/2019): A1c 9.4% , we increased Metformin , decreased repaglinide  Today (06/05/2019): Darrell Santos is here for a follow up on diabetes management.  He checks his blood sugars 1 times daily. The patient has had hypoglycemic episodes since the last clinic visit, which typically occur 1 x /week  - most often occurring in the late afternoon .  Denies nausea diarrhea or vomiting   HOME DIABETES REGIMEN:  Metformin 500 mg 2 tabs BID   Statin: No ACE-I/ARB: yes    METER DOWNLOAD SUMMARY: Did not bring     DIABETIC COMPLICATIONS: Microvascular complications:    Denies: CKD, retinopathy, neuropathy  Last eye exam: Completed 05/30/2019  Macrovascular complications:    Denies: CAD, PVD, CVA   HISTORY:  Past Medical History:  Past Medical History:  Diagnosis Date  . Allergic rhinitis   . Allergy   . Anxiety   . Asthma   . Diabetes mellitus without complication ([place])   . GERD (gastroesophageal reflux disease)   . HTN (hypertension)   . Hyperlipidemia     borderline  . Rosacea    Past Surgical History:  Past Surgical History:  Procedure Laterality Date  . CHOLECYSTECTOMY    . COLONOSCOPY    . NASAL SINUS SURGERY    . TOTAL HIP ARTHROPLASTY      Social History:  reports that he has never smoked. He quit smokeless tobacco use about 22 years ago. He reports current alcohol use of about 14.0 standard drinks of alcohol per week. He reports that he does not use drugs. Family History:  Family History  Problem Relation Age of Onset  . Hypertension Other   . Hypertension Other   . Diabetes Other        1st degree relative   . Colon polyps Neg Hx   . Colon cancer Neg Hx   . Esophageal cancer Neg Hx   . Rectal cancer Neg Hx   . Stomach cancer Neg Hx      HOME MEDICATIONS: Allergies as of 06/05/2019      Reactions   Midazolam Shortness Of Breath   (Pre-op)- "dificulty breathing with associated heaviness in chest"  Molds & Smuts Shortness Of Breath   Other Anaphylaxis   Cats and nuts   Pollen Extract       Medication List       Accurate as of Jun 05, 2019  4:21 PM. If you have any questions, ask your nurse or doctor.        albuterol 108 (90 Base) MCG/ACT inhaler Commonly known as: ProAir HFA Inhale 2 puffs into the lungs every 4 (four) hours as needed for wheezing or shortness of breath.   clonazePAM 0.5 MG tablet Commonly known as: KLONOPIN Take 1 tablet (0.5 mg total) by mouth at bedtime as needed.   escitalopram 10 MG tablet Commonly known as: LEXAPRO Take 1 tablet (10 mg total) by mouth daily.   fenofibrate 160 MG tablet Take 1 tablet (160 mg total) by mouth daily.   metFORMIN 500 MG tablet Commonly known as: GLUCOPHAGE Take 1 tablet (500 mg total) by mouth daily with breakfast AND 2 tablets (1,000 mg total) daily with supper. What changed: See the new instructions. Changed by: Dorita Sciara, MD   olmesartan-hydrochlorothiazide 20-12.5 MG tablet Commonly known as: BENICAR HCT Take 1 tablet by mouth  daily.   pantoprazole 40 MG tablet Commonly known as: PROTONIX Take 1 tablet (40 mg total) by mouth daily.   Qvar 80 MCG/ACT inhaler Generic drug: beclomethasone INHALE 1 PUFF INTO THE LUNGS TWICE A DAY   repaglinide 0.5 MG tablet Commonly known as: PRANDIN Take 1 tablet (0.5 mg total) by mouth 3 (three) times daily before meals.        OBJECTIVE:   Vital Signs: BP 118/72 (BP Location: Left Arm, Patient Position: Sitting, Cuff Size: Large)   Pulse 65   Temp 98.7 F (37.1 C)   Ht 6' (1.829 m)   Wt 197 lb 9.6 oz (89.6 kg)   SpO2 98%   BMI 26.80 kg/m   Wt Readings from Last 3 Encounters:  06/05/19 197 lb 9.6 oz (89.6 kg)  04/30/19 204 lb (92.5 kg)  03/07/19 212 lb 3.2 oz (96.3 kg)     Exam: General: Pt appears well and is in NAD  Lungs: Clear with good BS bilat with no rales, rhonchi, or wheezes  Heart: RRR with normal S1 and S2 and no gallops; no murmurs; no rub  Abdomen: Normoactive bowel sounds, soft, nontender, without masses or organomegaly palpable  Extremities: No pretibial edema.  Skin: Normal texture and temperature to palpation.  Neuro: MS is good with appropriate affect, pt is alert and Ox3        DM foot exam: 03/07/2019  The skin of the feet is intact without sores or ulcerations, pt with callous formation at the medial aspect of the 1st MT joint The pedal pulses are 2+ on right and 2+ on left. The sensation is intact to a screening 5.07, 10 gram monofilament bilaterally       DATA REVIEWED:  Lab Results  Component Value Date   HGBA1C 5.4 06/05/2019   HGBA1C 9.4 (H) 02/19/2019   Lab Results  Component Value Date   MICROALBUR 0.8 03/07/2019   LDLCALC 109 (H) 03/18/2017   CREATININE 0.82 08/09/2018   Lab Results  Component Value Date   MICRALBCREAT 0.5 03/07/2019     Lab Results  Component Value Date   CHOL 219 (H) 08/09/2018   HDL 45.70 08/09/2018   LDLCALC 109 (H) 03/18/2017   LDLDIRECT 158.0 08/09/2018   TRIG 261.0 (H)  08/09/2018   CHOLHDL 5 08/09/2018  ASSESSMENT / PLAN / RECOMMENDATIONS:   1) Type 2 Diabetes Mellitus, Optimally controlled, Without complications - Most recent A1c of 5.4 %. Goal A1c < 7.0%.    - I have congratulated the pt on the his optimal glycemic control and weight loss and I have encouraged him to continue with lifestyle changes.  - Since he continues to report hypoglycemia in the late afternoon , I am going to adjust his metformin as below    MEDICATIONS:  Decrease Metformin 500 mg , 1 tablet with Breakfast and Continue 2 tablets with Supper   EDUCATION / INSTRUCTIONS:  BG monitoring instructions: Patient is instructed to check his blood sugars 3 times a week .  Call [facility] if: BG persistently < 70 or > 300. . I reviewed the Rule of 15 for the treatment of hypoglycemia in detail with the patient. Literature supplied.     2) Diabetic complications:   Eye: Does not have known diabetic retinopathy. Up to date   Neuro/ Feet: Does not have known diabetic peripheral neuropathy.  Renal: Patient does not have known baseline CKD. He is on an ACEI/ARB at present.  3) Dyslipidemia: Patient is not on a statin.  He is on fenofibrate.  We discussed the cardiovascular benefits of statins, we also discussed the ADA recommendations about statin use in pt between ages 31-75 . He is having a physical with his PCP in the near future and believes he will have an updated lipid panel then .    F/U in 4 months     Signed electronically by: Mack Guise, MD  [facility] Endocrinology  [facility] [address] [place], [place] [address] Phone: [phone] FAX: [phone]   CC: Cassandria Anger, MD [place] [address] Phone: [phone]  Fax: [phone]  Return to Endocrinology clinic as below: Future Appointments  Date Time Provider [place]  08/13/2019  4:00 PM Plotnikov,  Evie Lacks, MD [facility] None  10/12/2019  3:40 PM Jahira Swiss, Melanie Crazier, MD [facility] None

## 2019-06-05 NOTE — Patient Instructions (Signed)
-   You have done an amazing job with your diabetes. Keep up the good work !  - Decrease Metformin to 1 tablet with Breakfast and 2 tablets with supper       HOW TO TREAT LOW BLOOD SUGARS (Blood sugar LESS THAN 70 MG/DL)  Please follow the RULE OF 15 for the treatment of hypoglycemia treatment (when your (blood sugars are less than 70 mg/dL)    STEP 1: Take 15 grams of carbohydrates when your blood sugar is low, which includes:   3-4 GLUCOSE TABS  OR  3-4 OZ OF JUICE OR REGULAR SODA OR  ONE TUBE OF GLUCOSE GEL     STEP 2: RECHECK blood sugar in 15 MINUTES STEP 3: If your blood sugar is still low at the 15 minute recheck --> then, go back to STEP 1 and treat AGAIN with another 15 grams of carbohydrates.

## 2019-06-07 ENCOUNTER — Other Ambulatory Visit: Payer: Self-pay

## 2019-06-07 MED ORDER — ONETOUCH VERIO VI STRP
ORAL_STRIP | 12 refills | Status: DC
Start: 2019-06-07 — End: 2023-02-23

## 2019-06-11 ENCOUNTER — Encounter: Payer: Self-pay | Admitting: Internal Medicine

## 2019-06-22 ENCOUNTER — Other Ambulatory Visit: Payer: Self-pay

## 2019-06-22 MED ORDER — ONETOUCH ULTRA CONTROL VI SOLN: 1.0000 | 0 refills | Status: AC

## 2019-08-13 ENCOUNTER — Encounter: Payer: BLUE CROSS/BLUE SHIELD | Admitting: Internal Medicine

## 2019-08-13 DIAGNOSIS — M65332 Trigger finger, left middle finger: Secondary | ICD-10-CM | POA: Diagnosis not present

## 2019-08-13 DIAGNOSIS — M65341 Trigger finger, right ring finger: Secondary | ICD-10-CM | POA: Diagnosis not present

## 2019-08-14 ENCOUNTER — Ambulatory Visit (INDEPENDENT_AMBULATORY_CARE_PROVIDER_SITE_OTHER): Payer: BC Managed Care – PPO | Admitting: Internal Medicine

## 2019-08-14 ENCOUNTER — Other Ambulatory Visit: Payer: Self-pay

## 2019-08-14 ENCOUNTER — Encounter: Payer: Self-pay | Admitting: Internal Medicine

## 2019-08-14 VITALS — BP 116/74 | HR 73 | Temp 98.5°F | Ht 72.0 in | Wt 195.0 lb

## 2019-08-14 DIAGNOSIS — Z7289 Other problems related to lifestyle: Secondary | ICD-10-CM | POA: Diagnosis not present

## 2019-08-14 DIAGNOSIS — Z0000 Encounter for general adult medical examination without abnormal findings: Secondary | ICD-10-CM

## 2019-08-14 DIAGNOSIS — Z789 Other specified health status: Secondary | ICD-10-CM

## 2019-08-14 DIAGNOSIS — I10 Essential (primary) hypertension: Secondary | ICD-10-CM | POA: Diagnosis not present

## 2019-08-14 DIAGNOSIS — E1165 Type 2 diabetes mellitus with hyperglycemia: Secondary | ICD-10-CM

## 2019-08-14 DIAGNOSIS — F411 Generalized anxiety disorder: Secondary | ICD-10-CM | POA: Diagnosis not present

## 2019-08-14 DIAGNOSIS — E785 Hyperlipidemia, unspecified: Secondary | ICD-10-CM | POA: Diagnosis not present

## 2019-08-14 DIAGNOSIS — R945 Abnormal results of liver function studies: Secondary | ICD-10-CM

## 2019-08-14 MED ORDER — CLONAZEPAM 0.5 MG PO TABS: 0.5000 mg | ORAL_TABLET | Freq: Every evening | ORAL | 0 refills | Status: DC | PRN

## 2019-08-14 NOTE — Assessment & Plan Note (Signed)
Clonazepam prn Public speaking anxiety  Potential benefits of a long term benzodiazepines  use as well as potential risks  and complications were explained to the patient and were aknowledged. 

## 2019-08-14 NOTE — Assessment & Plan Note (Addendum)
Labs On Fenofibrate

## 2019-08-14 NOTE — Progress Notes (Signed)
Subjective:  Patient ID: Darrell Santos, male    DOB: 10/03/1965  Age: 54 y.o. MRN: [id number]  CC: No chief complaint on file.   HPI Darrell Santos presents for a well exam DM, anxiety, depression, fatty liver f/u  Outpatient Medications Prior to Visit  Medication Sig Dispense Refill  . albuterol (PROAIR HFA) 108 (90 Base) MCG/ACT inhaler Inhale 2 puffs into the lungs every 4 (four) hours as needed for wheezing or shortness of breath. 56 g 3  . beclomethasone (QVAR) 80 MCG/ACT inhaler INHALE 1 PUFF INTO THE LUNGS TWICE A DAY 3 Inhaler 3  . Blood Glucose Calibration (OT ULTRA/FASTTK CNTRL SOLN) SOLN 1 Bottle by Does not apply route as directed. 2 each 0  . clonazePAM (KLONOPIN) 0.5 MG tablet Take 1 tablet (0.5 mg total) by mouth at bedtime as needed. 90 tablet 1  . escitalopram (LEXAPRO) 10 MG tablet Take 1 tablet (10 mg total) by mouth daily. 90 tablet 3  . fenofibrate 160 MG tablet Take 1 tablet (160 mg total) by mouth daily. 90 tablet 3  . glucose blood (ONETOUCH VERIO) test strip Use as instructed to test blood sugar 3 times daily E11.9 100 each 12  . metFORMIN (GLUCOPHAGE) 500 MG tablet Take 1 tablet (500 mg total) by mouth daily with breakfast AND 2 tablets (1,000 mg total) daily with supper. 270 tablet 3  . olmesartan-hydrochlorothiazide (BENICAR HCT) 20-12.5 MG tablet Take 1 tablet by mouth daily. 90 tablet 3  . pantoprazole (PROTONIX) 40 MG tablet Take 1 tablet (40 mg total) by mouth daily. 90 tablet 3   Facility-Administered Medications Prior to Visit  Medication Dose Route Frequency Provider Last Rate Last Admin  . 0.9 %  sodium chloride infusion  500 mL Intravenous Once Nelida Meuse III, MD        ROS: Review of Systems  Constitutional: Negative for appetite change, fatigue and unexpected weight change.  HENT: Negative for congestion, nosebleeds, sneezing, sore throat and trouble swallowing.   Eyes: Negative for itching and visual disturbance.  Respiratory:  Negative for cough.   Cardiovascular: Negative for chest pain, palpitations and leg swelling.  Gastrointestinal: Negative for abdominal distention, blood in stool, diarrhea and nausea.  Genitourinary: Negative for frequency and hematuria.  Musculoskeletal: Negative for back pain, gait problem, joint swelling and neck pain.  Skin: Negative for rash.  Neurological: Negative for dizziness, tremors, speech difficulty and weakness.  Psychiatric/Behavioral: Negative for agitation, dysphoric mood and sleep disturbance. The patient is nervous/anxious.     Objective:  There were no vitals taken for this visit.  BP Readings from Last 3 Encounters:  06/05/19 118/72  03/07/19 132/78  02/19/19 138/86    Wt Readings from Last 3 Encounters:  06/05/19 197 lb 9.6 oz (89.6 kg)  04/30/19 204 lb (92.5 kg)  03/07/19 212 lb 3.2 oz (96.3 kg)    Physical Exam Constitutional:      General: He is not in acute distress.    Appearance: He is well-developed.     Comments: NAD  Eyes:     Conjunctiva/sclera: Conjunctivae normal.     Pupils: Pupils are equal, round, and reactive to light.  Neck:     Thyroid: No thyromegaly.     Vascular: No JVD.  Cardiovascular:     Rate and Rhythm: Normal rate and regular rhythm.     Heart sounds: Normal heart sounds. No murmur heard.  No friction rub. No gallop.   Pulmonary:     Effort:  Pulmonary effort is normal. No respiratory distress.     Breath sounds: Normal breath sounds. No wheezing or rales.  Chest:     Chest wall: No tenderness.  Abdominal:     General: Bowel sounds are normal. There is no distension.     Palpations: Abdomen is soft. There is no mass.     Tenderness: There is no abdominal tenderness. There is no guarding or rebound.  Musculoskeletal:        General: No tenderness. Normal range of motion.     Cervical back: Normal range of motion.  Lymphadenopathy:     Cervical: No cervical adenopathy.  Skin:    General: Skin is warm and dry.      Findings: No rash.  Neurological:     Mental Status: He is alert and oriented to person, place, and time.     Cranial Nerves: No cranial nerve deficit.     Motor: No abnormal muscle tone.     Coordination: Coordination normal.     Gait: Gait normal.     Deep Tendon Reflexes: Reflexes are normal and symmetric.  Psychiatric:        Behavior: Behavior normal.        Thought Content: Thought content normal.        Judgment: Judgment normal.    Rectal - declined  Lab Results  Component Value Date   WBC 7.0 08/09/2018   HGB 14.9 08/09/2018   HCT 43.4 08/09/2018   PLT 198.0 08/09/2018   GLUCOSE 103 (H) 08/09/2018   CHOL 219 (H) 08/09/2018   TRIG 261.0 (H) 08/09/2018   HDL 45.70 08/09/2018   LDLDIRECT 158.0 08/09/2018   LDLCALC 109 (H) 03/18/2017   ALT 81 (H) 08/09/2018   AST 86 (H) 08/09/2018   NA 138 08/09/2018   K 3.8 08/09/2018   CL 103 08/09/2018   CREATININE 0.82 08/09/2018   BUN 11 08/09/2018   CO2 25 08/09/2018   TSH 1.32 08/09/2018   PSA 0.46 08/09/2018   HGBA1C 5.4 06/05/2019   MICROALBUR 0.8 03/07/2019    No results found.  Assessment & Plan:     Follow-up: No follow-ups on file.  Walker Kehr, MD

## 2019-08-14 NOTE — Assessment & Plan Note (Addendum)
Benicar HCT . Cut back on alcohol.

## 2019-08-14 NOTE — Assessment & Plan Note (Addendum)
We discussed age appropriate health related issues, including available/recomended screening tests and vaccinations. We discussed a need for adhering to healthy diet and exercise. Labs were ordered to be later reviewed . All questions were answered. Colon 2019 A cardiac CT scan for calcium scoring:  A cardiac CT scan for calcium scoring :  IMPRESSION: Coronary calcium score of 0. This was 0 percentile for age and sex matched control.   Electronically Signed   By: Ena Dawley   On: 10/11/2018 18:21

## 2019-08-14 NOTE — Assessment & Plan Note (Signed)
ETOH: 1/2 pint a day of burbon

## 2019-08-15 ENCOUNTER — Other Ambulatory Visit: Payer: Self-pay | Admitting: Internal Medicine

## 2019-08-15 LAB — CBC WITH DIFFERENTIAL/PLATELET
Absolute Monocytes: 411 cells/uL (ref 200–950)
Basophils Absolute: 31 cells/uL (ref 0–200)
Basophils Relative: 0.6 %
Eosinophils Absolute: 354 cells/uL (ref 15–500)
Eosinophils Relative: 6.8 %
HCT: 44.8 % (ref 38.5–50.0)
Hemoglobin: 14.9 g/dL (ref 13.2–17.1)
Lymphs Abs: 1971 cells/uL (ref 850–3900)
MCH: 31.1 pg (ref 27.0–33.0)
MCHC: 33.3 g/dL (ref 32.0–36.0)
MCV: 93.5 fL (ref 80.0–100.0)
MPV: 11.1 fL (ref 7.5–12.5)
Monocytes Relative: 7.9 %
Neutro Abs: 2434 cells/uL (ref 1500–7800)
Neutrophils Relative %: 46.8 %
Platelets: 193 10*3/uL (ref 140–400)
RBC: 4.79 10*6/uL (ref 4.20–5.80)
RDW: 12.7 % (ref 11.0–15.0)
Total Lymphocyte: 37.9 %
WBC: 5.2 10*3/uL (ref 3.8–10.8)

## 2019-08-15 LAB — COMPLETE METABOLIC PANEL WITH GFR
AG Ratio: 1.8 (calc) (ref 1.0–2.5)
ALT: 30 U/L (ref 9–46)
AST: 24 U/L (ref 10–35)
Albumin: 4.8 g/dL (ref 3.6–5.1)
Alkaline phosphatase (APISO): 29 U/L — ABNORMAL LOW (ref 35–144)
BUN: 17 mg/dL (ref 7–25)
CO2: 25 mmol/L (ref 20–32)
Calcium: 9.5 mg/dL (ref 8.6–10.3)
Chloride: 101 mmol/L (ref 98–110)
Creat: 0.91 mg/dL (ref 0.70–1.33)
GFR, Est African American: 110 mL/min/{1.73_m2} (ref >=60)
GFR, Est Non African American: 95 mL/min/{1.73_m2} (ref >=60)
Globulin: 2.6 g/dL (calc) (ref 1.9–3.7)
Glucose, Bld: 91 mg/dL (ref 65–99)
Potassium: 4.2 mmol/L (ref 3.5–5.3)
Sodium: 138 mmol/L (ref 135–146)
Total Bilirubin: 0.7 mg/dL (ref 0.2–1.2)
Total Protein: 7.4 g/dL (ref 6.1–8.1)

## 2019-08-15 LAB — LIPID PANEL
Cholesterol: 203 mg/dL — ABNORMAL HIGH (ref <200)
HDL: 63 mg/dL (ref >=40)
LDL Cholesterol (Calc): 124 mg/dL (calc) — ABNORMAL HIGH
Non-HDL Cholesterol (Calc): 140 mg/dL (calc) — ABNORMAL HIGH (ref <130)
Total CHOL/HDL Ratio: 3.2 (calc) (ref <5.0)
Triglycerides: 65 mg/dL (ref <150)

## 2019-08-15 LAB — URINALYSIS
Bilirubin Urine: NEGATIVE
Glucose, UA: NEGATIVE
Hgb urine dipstick: NEGATIVE
Ketones, ur: NEGATIVE
Leukocytes,Ua: NEGATIVE
Nitrite: NEGATIVE
Protein, ur: NEGATIVE
Specific Gravity, Urine: 1.017 (ref 1.001–1.03)
pH: 5.5 (ref 5.0–8.0)

## 2019-08-15 LAB — PSA: PSA: 0.9 ng/mL (ref <=4.0)

## 2019-08-15 LAB — TSH: TSH: 1.78 mIU/L (ref 0.40–4.50)

## 2019-08-19 NOTE — Assessment & Plan Note (Signed)
LFTs 

## 2019-08-19 NOTE — Assessment & Plan Note (Addendum)
C/o wt gain in isolation; drinking <1/2 pint of bourbon and no beer lately. Cont Metformin 1/2 tab bid - unable to take larger tablets. Cut back on alcohol. Metformin risks discussed

## 2019-08-27 ENCOUNTER — Other Ambulatory Visit: Payer: Self-pay | Admitting: Internal Medicine

## 2019-09-06 DIAGNOSIS — M65341 Trigger finger, right ring finger: Secondary | ICD-10-CM | POA: Diagnosis not present

## 2019-09-19 ENCOUNTER — Emergency Department (INDEPENDENT_AMBULATORY_CARE_PROVIDER_SITE_OTHER)
Admission: EM | Admit: 2019-09-19 | Discharge: 2019-09-19 | Disposition: A | Discharge status: Home / Self Care | Payer: BC Managed Care – PPO | Source: Home / Self Care | Attending: Internal Medicine | Admitting: Internal Medicine

## 2019-09-19 ENCOUNTER — Other Ambulatory Visit: Payer: Self-pay

## 2019-09-19 DIAGNOSIS — R05 Cough: Secondary | ICD-10-CM | POA: Diagnosis not present

## 2019-09-19 DIAGNOSIS — R059 Cough, unspecified: Secondary | ICD-10-CM

## 2019-09-19 DIAGNOSIS — J069 Acute upper respiratory infection, unspecified: Secondary | ICD-10-CM | POA: Diagnosis not present

## 2019-09-19 NOTE — ED Triage Notes (Signed)
Patient presents to Urgent Care with complaints of nasal congestion and cough since 4-5 days ago. Patient reports he has been vaccinated for covid but would still like to be tested for covid.

## 2019-09-19 NOTE — Discharge Instructions (Addendum)
It was wonderful to meet you today! You likely have a viral upper respiratory infection and possibly allergies contributing. It is very important that you stay hydrated and can continue to use the Robitussin if this is worked well for you. Tylenol 650 mg every 4-6 hours as well for any discomforts. You could also retry Zyrtec/Claritin and Flonase for allergies. Your Covid test should result in the next few days, please stay at home in the meantime.   Follow-up with your primary care provider if you are not improving or sooner if worsening including development of fever or shortness of breath.

## 2019-09-19 NOTE — ED Provider Notes (Signed)
[facility]    CSN: [id number] Arrival date & time: 09/19/19  1833      History   Chief Complaint Chief Complaint  Patient presents with  . Nasal Congestion    HPI Darrell Santos is a 54 y.o. male with a history of allergic rhinitis, asthma, T2DM, and GERD presenting with a several day history of nasal congestion and cough.  Fully vaccinated against Covid however would like to be tested today.   Initially started 4-5 days ago with rhinorrhea/nasal congestion and frequent sneezing.  Now having postnasal drip with subsequent dry cough and throat irritation.  Has been using Robitussin with some success at home.  Not on any medications for allergies as they previously have not helped, usually has seasonal allergies around this time of year.  No known sick contacts, however recently around granddaughter who had RSV.  Denies any fever, myalgias, difficulty breathing, or chest pain.   Past Medical History:  Diagnosis Date  . Allergic rhinitis   . Allergy   . Anxiety   . Asthma   . Diabetes mellitus without complication ([place])   . GERD (gastroesophageal reflux disease)   . HTN (hypertension)   . Hyperlipidemia    borderline  . Rosacea     Patient Active Problem List   Diagnosis Date Noted  . Type 2 diabetes mellitus without complication, without long-term current use of insulin ([place]) 06/05/2019  . Uncontrolled type 2 diabetes mellitus with hyperglycemia ([place]) 02/21/2019  . Alcohol use 02/21/2019  . Fatty liver 10/11/2018  . Preop exam for internal medicine 09/01/2017  . Foot pain, left 09/02/2016  . Dyslipidemia 09/10/2015  . Restless leg syndrome 09/19/2014  . Rash and nonspecific skin eruption 09/19/2014  . Warts 09/27/2012  . Well adult exam 08/16/2012  . Actinic keratoses 08/16/2012  . Food allergy 08/15/2012  . 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 Diarrhea 06/30/2010  . CHOLELITHIASIS 01/08/2010  . RUQ PAIN 12/29/2009  . ACTINIC KERATOSIS  07/31/2007  . Neoplasm of uncertain behavior of skin 06/21/2007  . ABNORMAL GLUCOSE NEC 06/21/2007  . ABNORMAL LIVER FUNCTION TESTS 06/21/2007  . Anxiety state 11/25/2006  . Essential hypertension 11/25/2006  . ALLERGIC RHINITIS 11/25/2006  . Asthma 11/25/2006  . GERD 11/25/2006    Past Surgical History:  Procedure Laterality Date  . CHOLECYSTECTOMY    . COLONOSCOPY    . NASAL SINUS SURGERY    . TOTAL HIP ARTHROPLASTY         Home Medications    Prior to Admission medications   Medication Sig Start Date End Date Taking? Authorizing Provider  albuterol (PROAIR HFA) 108 (90 Base) MCG/ACT inhaler Inhale 2 puffs into the lungs every 4 (four) hours as needed for wheezing or shortness of breath. 08/09/18   Plotnikov, Evie Lacks, MD  beclomethasone (QVAR) 80 MCG/ACT inhaler INHALE 1 PUFF INTO THE LUNGS TWICE A DAY 08/09/18   Plotnikov, Evie Lacks, MD  Blood Glucose Calibration (OT ULTRA/FASTTK CNTRL SOLN) SOLN 1 Bottle by Does not apply route as directed. 06/22/19   Shamleffer, Melanie Crazier, MD  clonazePAM (KLONOPIN) 0.5 MG tablet Take 1 tablet (0.5 mg total) by mouth at bedtime as needed. 08/14/19   Plotnikov, Evie Lacks, MD  escitalopram (LEXAPRO) 10 MG tablet Take 1 tablet (10 mg total) by mouth daily. 08/09/18   Plotnikov, Evie Lacks, MD  fenofibrate 160 MG tablet TAKE 1 TABLET DAILY 08/28/19   Plotnikov, Evie Lacks, MD  glucose blood (ONETOUCH VERIO) test strip Use as instructed to  test blood sugar 3 times daily E11.9 06/07/19   Shamleffer, Melanie Crazier, MD  metFORMIN (GLUCOPHAGE) 500 MG tablet Take 1 tablet (500 mg total) by mouth daily with breakfast AND 2 tablets (1,000 mg total) daily with supper. 06/05/19   Shamleffer, Melanie Crazier, MD  olmesartan-hydrochlorothiazide (BENICAR HCT) 20-12.5 MG tablet TAKE 1 TABLET DAILY 08/15/19   Plotnikov, Evie Lacks, MD  pantoprazole (PROTONIX) 40 MG tablet Take 1 tablet (40 mg total) by mouth daily. 08/09/18   Plotnikov, Evie Lacks, MD    Family  History Family History  Problem Relation Age of Onset  . Hypertension Other   . Hypertension Other   . Diabetes Other        1st degree relative   . Asthma Mother   . Hypertension Father   . Diabetes Father   . Colon polyps Neg Hx   . Colon cancer Neg Hx   . Esophageal cancer Neg Hx   . Rectal cancer Neg Hx   . Stomach cancer Neg Hx     Social History Social History   Tobacco Use  . Smoking status: Never Smoker  . Smokeless tobacco: Former Network engineer  . Vaping Use: Never used  Substance Use Topics  . Alcohol use: Yes    Alcohol/week: 30.0 standard drinks    Types: 30 Standard drinks or equivalent per week    Comment: 3-5 per day  . Drug use: No     Allergies   Midazolam, Molds & smuts, Other, and Pollen extract   Review of Systems Review of Systems  HENT: Positive for congestion, postnasal drip, rhinorrhea, sneezing and sore throat. Negative for ear discharge, ear pain, facial swelling, sinus pressure and sinus pain.   Eyes: Negative for discharge.  Respiratory: Negative for shortness of breath and wheezing.   Cardiovascular: Negative for chest pain.  Gastrointestinal: Negative for nausea and vomiting.  Musculoskeletal: Negative for myalgias.  Skin: Negative for rash.  Neurological: Negative for dizziness, light-headedness and headaches.    Physical Exam Triage Vital Signs ED Triage Vitals  Enc Vitals Group     BP 09/19/19 1859 (!) 146/86     Pulse Rate 09/19/19 1859 66     Resp 09/19/19 1859 16     Temp 09/19/19 1859 98.6 F (37 C)     Temp Source 09/19/19 1859 Oral     SpO2 09/19/19 1859 97 %     Weight --      Height --      Head Circumference --      Peak Flow --      Pain Score 09/19/19 1858 0     Pain Loc --      Pain Edu? --      Excl. in [place]? --    No data found.  Updated Vital Signs BP (!) 146/86 (BP Location: Right Arm)   Pulse 66   Temp 98.6 F (37 C) (Oral)   Resp 16   SpO2 97%   Physical Exam Constitutional:       General: He is not in acute distress.    Appearance: Normal appearance. He is not ill-appearing.  HENT:     Head: Normocephalic and atraumatic.     Comments: No tenderness palpation of frontal or maxillary sinuses.    Nose: Rhinorrhea present.     Mouth/Throat:     Mouth: Mucous membranes are moist.     Pharynx: No oropharyngeal exudate or posterior oropharyngeal erythema.  Eyes:  Extraocular Movements: Extraocular movements intact.  Cardiovascular:     Rate and Rhythm: Normal rate and regular rhythm.     Heart sounds: No murmur heard.   Pulmonary:     Effort: Pulmonary effort is normal.     Breath sounds: Normal breath sounds.  Musculoskeletal:     Cervical back: Normal range of motion and neck supple.  Lymphadenopathy:     Cervical: No cervical adenopathy.  Skin:    General: Skin is warm and dry.     Capillary Refill: Capillary refill takes less than 2 seconds.     Findings: No rash.  Neurological:     Mental Status: He is alert.     UC Treatments / Results  Labs (all labs ordered are listed, but only abnormal results are displayed) Labs Reviewed  SARS-COV-2 RNA,(COVID-19) QUALITATIVE NAAT    EKG   Radiology No results found.  Procedures Procedures (including critical care time)  Medications Ordered in UC Medications - No data to display  Initial Impression / Assessment and Plan / UC Course  I have reviewed the triage vital signs and the nursing notes.  Pertinent labs & imaging results that were available during my care of the patient were reviewed by me and considered in my medical decision making (see chart for details).    54 year old gentleman presenting with several day history of nasal congestion with postnasal drip and dry cough, consistent with viral URI and likely allergic component as well.  Afebrile and well-appearing.  Covid swab obtained and pending, however low suspicion for this given overall well appearance.  Recommended supportive care  with hydration, Tylenol, and Robitussin as is.  May retry Flonase/Zyrtec for allergies.  Follow-up if not improving or sooner if worsening.    Final Clinical Impressions(s) / UC Diagnoses   Final diagnoses:  Cough  Viral URI with cough     Discharge Instructions     It was wonderful to meet you today! You likely have a viral upper respiratory infection and possibly allergies contributing. It is very important that you stay hydrated and can continue to use the Robitussin if this is worked well for you. Tylenol 650 mg every 4-6 hours as well for any discomforts. You could also retry Zyrtec/Claritin and Flonase for allergies. Your Covid test should result in the next few days, please stay at home in the meantime.   Follow-up with your primary care provider if you are not improving or sooner if worsening including development of fever or shortness of breath.     ED Prescriptions    None     PDMP not reviewed this encounter.   Patriciaann Clan, DO 09/19/19 2003

## 2019-09-20 LAB — SARS-COV-2 RNA,(COVID-19) QUALITATIVE NAAT: SARS CoV2 RNA: NOT DETECTED

## 2019-09-24 ENCOUNTER — Other Ambulatory Visit: Payer: Self-pay | Admitting: Internal Medicine

## 2019-09-27 ENCOUNTER — Emergency Department (INDEPENDENT_AMBULATORY_CARE_PROVIDER_SITE_OTHER): Payer: BC Managed Care – PPO

## 2019-09-27 ENCOUNTER — Other Ambulatory Visit: Payer: Self-pay

## 2019-09-27 ENCOUNTER — Emergency Department
Admission: EM | Admit: 2019-09-27 | Discharge: 2019-09-27 | Disposition: A | Discharge status: Home / Self Care | Payer: BC Managed Care – PPO | Source: Home / Self Care

## 2019-09-27 DIAGNOSIS — R05 Cough: Secondary | ICD-10-CM

## 2019-09-27 DIAGNOSIS — J069 Acute upper respiratory infection, unspecified: Secondary | ICD-10-CM

## 2019-09-27 DIAGNOSIS — R5383 Other fatigue: Secondary | ICD-10-CM | POA: Diagnosis not present

## 2019-09-27 MED ORDER — PROMETHAZINE-DM 6.25-15 MG/5ML PO SYRP: 5.0000 mL | ORAL_SOLUTION | Freq: Four times a day (QID) | ORAL | 0 refills | Status: DC | PRN

## 2019-09-27 NOTE — ED Triage Notes (Signed)
Patient presents to Urgent Care with complaints of continued cough since last week. Patient reports he had a negative covid test during his visit, but his cough has gotten worse and has dark-colored mucous that he was coughing up, reports worsened fatigue as well. Has been vaccinated for covid.

## 2019-09-27 NOTE — ED Provider Notes (Signed)
[name]    CSN: [id number] Arrival date & time: 09/27/19  1814      History   Chief Complaint Chief Complaint  Patient presents with  . Cough    HPI Darrell Santos is a 54 y.o. male.   HPI  Darrell Santos is a 54 y.o. male presenting to UC with c/o 1 week of cough, congestion, dark phlegm and fatigue. Pt had a negative COVID test last week. He was exposed to his granddaughter who tested positive for RSV last week.  Denies fever, chills, n/v/d. No chest pain or SOB. He has received the COVID vaccine.  Pt accompanied by wife who has similar symptoms for about 1 week. She has been vaccinated for COVID as well but was advised by Evisit to be tested today.    Past Medical History:  Diagnosis Date  . Allergic rhinitis   . Allergy   . Anxiety   . Asthma   . Diabetes mellitus without complication ([place])   . GERD (gastroesophageal reflux disease)   . HTN (hypertension)   . Hyperlipidemia    borderline  . Rosacea     Patient Active Problem List   Diagnosis Date Noted  . Type 2 diabetes mellitus without complication, without long-term current use of insulin ([employer]) 06/05/2019  . Uncontrolled type 2 diabetes mellitus with hyperglycemia ([place]) 02/21/2019  . Alcohol use 02/21/2019  . Fatty liver 10/11/2018  . Preop exam for internal medicine 09/01/2017  . Foot pain, left 09/02/2016  . Dyslipidemia 09/10/2015  . Restless leg syndrome 09/19/2014  . Rash and nonspecific skin eruption 09/19/2014  . Warts 09/27/2012  . Well adult exam 08/16/2012  . Actinic keratoses 08/16/2012  . Food allergy 08/15/2012  . 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 Diarrhea 06/30/2010  . CHOLELITHIASIS 01/08/2010  . RUQ PAIN 12/29/2009  . ACTINIC KERATOSIS 07/31/2007  . Neoplasm of uncertain behavior of skin 06/21/2007  . ABNORMAL GLUCOSE NEC 06/21/2007  . ABNORMAL LIVER FUNCTION TESTS 06/21/2007  . Anxiety state 11/25/2006  . Essential hypertension 11/25/2006  . ALLERGIC  RHINITIS 11/25/2006  . Asthma 11/25/2006  . GERD 11/25/2006    Past Surgical History:  Procedure Laterality Date  . CHOLECYSTECTOMY    . COLONOSCOPY    . NASAL SINUS SURGERY    . TOTAL HIP ARTHROPLASTY         Home Medications    Prior to Admission medications   Medication Sig Start Date End Date Taking? Authorizing Provider  albuterol (PROAIR HFA) 108 (90 Base) MCG/ACT inhaler Inhale 2 puffs into the lungs every 4 (four) hours as needed for wheezing or shortness of breath. 08/09/18   Plotnikov, Evie Lacks, MD  beclomethasone (QVAR) 80 MCG/ACT inhaler INHALE 1 PUFF INTO THE LUNGS TWICE A DAY 08/09/18   Plotnikov, Evie Lacks, MD  Blood Glucose Calibration (OT ULTRA/FASTTK CNTRL SOLN) SOLN 1 Bottle by Does not apply route as directed. 06/22/19   Shamleffer, Melanie Crazier, MD  clonazePAM (KLONOPIN) 0.5 MG tablet Take 1 tablet (0.5 mg total) by mouth at bedtime as needed. 08/14/19   Plotnikov, Evie Lacks, MD  escitalopram (LEXAPRO) 10 MG tablet TAKE 1 TABLET DAILY 09/24/19   Plotnikov, Evie Lacks, MD  fenofibrate 160 MG tablet TAKE 1 TABLET DAILY 08/28/19   Plotnikov, Evie Lacks, MD  glucose blood (ONETOUCH VERIO) test strip Use as instructed to test blood sugar 3 times daily E11.9 06/07/19   Shamleffer, Melanie Crazier, MD  metFORMIN (GLUCOPHAGE) 500 MG tablet Take 1 tablet (500  mg total) by mouth daily with breakfast AND 2 tablets (1,000 mg total) daily with supper. 06/05/19   Shamleffer, Melanie Crazier, MD  olmesartan-hydrochlorothiazide (BENICAR HCT) 20-12.5 MG tablet TAKE 1 TABLET DAILY 08/15/19   Plotnikov, Evie Lacks, MD  pantoprazole (PROTONIX) 40 MG tablet TAKE 1 TABLET DAILY 09/24/19   Plotnikov, Evie Lacks, MD  promethazine-dextromethorphan (PROMETHAZINE-DM) 6.25-15 MG/5ML syrup Take 5 mLs by mouth 4 (four) times daily as needed for cough. 09/27/19   Noe Gens, PA-C    Family History Family History  Problem Relation Age of Onset  . Hypertension Other   . Hypertension Other   .  Diabetes Other        1st degree relative   . Asthma Mother   . Hypertension Father   . Diabetes Father   . Colon polyps Neg Hx   . Colon cancer Neg Hx   . Esophageal cancer Neg Hx   . Rectal cancer Neg Hx   . Stomach cancer Neg Hx     Social History Social History   Tobacco Use  . Smoking status: Never Smoker  . Smokeless tobacco: Former Network engineer  . Vaping Use: Never used  Substance Use Topics  . Alcohol use: Yes    Alcohol/week: 30.0 standard drinks    Types: 30 Standard drinks or equivalent per week    Comment: 3-5 per day  . Drug use: No     Allergies   Midazolam, Molds & smuts, Other, and Pollen extract   Review of Systems Review of Systems  Constitutional: Positive for fatigue. Negative for chills and fever.  HENT: Positive for congestion, postnasal drip and sore throat. Negative for ear pain, trouble swallowing and voice change.   Respiratory: Positive for cough. Negative for shortness of breath.   Cardiovascular: Negative for chest pain and palpitations.  Gastrointestinal: Negative for abdominal pain, diarrhea, nausea and vomiting.  Musculoskeletal: Negative for arthralgias, back pain and myalgias.  Skin: Negative for rash.  Neurological: Negative for dizziness, light-headedness and headaches.  All other systems reviewed and are negative.    Physical Exam Triage Vital Signs ED Triage Vitals  Enc Vitals Group     BP 09/27/19 1836 (!) 143/84     Pulse Rate 09/27/19 1836 75     Resp 09/27/19 1836 18     Temp 09/27/19 1836 97.7 F (36.5 C)     Temp Source 09/27/19 1836 Oral     SpO2 09/27/19 1836 95 %     Weight --      Height --      Head Circumference --      Peak Flow --      Pain Score 09/27/19 1833 0     Pain Loc --      Pain Edu? --      Excl. in [place]? --    No data found.  Updated Vital Signs BP (!) 143/84 (BP Location: Right Arm)   Pulse 75   Temp 97.7 F (36.5 C) (Oral)   Resp 18   SpO2 95%   Visual Acuity Right Eye  Distance:   Left Eye Distance:   Bilateral Distance:    Right Eye Near:   Left Eye Near:    Bilateral Near:     Physical Exam Vitals and nursing note reviewed.  Constitutional:      General: He is not in acute distress.    Appearance: Normal appearance. He is well-developed. He is not ill-appearing, toxic-appearing or diaphoretic.  HENT:     Head: Normocephalic and atraumatic.     Right Ear: Tympanic membrane and ear canal normal.     Left Ear: Tympanic membrane and ear canal normal.     Nose: Nose normal.     Right Sinus: No maxillary sinus tenderness or frontal sinus tenderness.     Left Sinus: No maxillary sinus tenderness or frontal sinus tenderness.     Mouth/Throat:     Lips: Pink.     Mouth: Mucous membranes are moist.     Pharynx: Oropharynx is clear. Uvula midline.  Cardiovascular:     Rate and Rhythm: Normal rate and regular rhythm.  Pulmonary:     Effort: Pulmonary effort is normal. No respiratory distress.     Breath sounds: Normal breath sounds. No stridor. No wheezing, rhonchi or rales.  Musculoskeletal:        General: Normal range of motion.     Cervical back: Normal range of motion.  Skin:    General: Skin is warm and dry.  Neurological:     Mental Status: He is alert and oriented to person, place, and time.  Psychiatric:        Behavior: Behavior normal.      UC Treatments / Results  Labs (all labs ordered are listed, but only abnormal results are displayed) Labs Reviewed - No data to display  EKG   Radiology DG Chest 2 View  Result Date: 09/27/2019 CLINICAL DATA:  Productive cough and fatigue. EXAM: CHEST - 2 VIEW COMPARISON:  None. FINDINGS: There is no evidence of acute infiltrate, pleural effusion or pneumothorax. The heart size and mediastinal contours are within normal limits. The visualized skeletal structures are unremarkable. Radiopaque surgical clips are seen within the right upper quadrant. IMPRESSION: No active cardiopulmonary disease.  Electronically Signed   By: Virgina Norfolk M.D.   On: 09/27/2019 19:06    Procedures Procedures (including critical care time)  Medications Ordered in UC Medications - No data to display  Initial Impression / Assessment and Plan / UC Course  I have reviewed the triage vital signs and the nursing notes.  Pertinent labs & imaging results that were available during my care of the patient were reviewed by me and considered in my medical decision making (see chart for details).     No evidence of sinusitis, no sinus tenderness on exam Reassured pt of normal CXR Encouraged symptomatic tx  F/u with PCP next week if not improving AVS given  Final Clinical Impressions(s) / UC Diagnoses   Final diagnoses:  Viral URI with cough     Discharge Instructions      You may take 500mg  acetaminophen every 4-6 hours or in combination with ibuprofen 400-600mg  every 6-8 hours as needed for pain, inflammation, and fever.  Be sure to well hydrated with clear liquids and get at least 8 hours of sleep at night, preferably more while sick.   Please follow up with family medicine in 1 week if needed.  The cough medication prescribed this evening can cause drowsiness. Caution when driving. Do not take more than prescribed. Do not take with other sedating medications such as Benadryl or sleep aids to help prevent accidental overdose.     ED Prescriptions    Medication Sig Dispense Auth. Provider   promethazine-dextromethorphan (PROMETHAZINE-DM) 6.25-15 MG/5ML syrup Take 5 mLs by mouth 4 (four) times daily as needed for cough. 118 mL Noe Gens, [place]     I have reviewed the PDMP during  this encounter.   Noe Gens, [place] 09/27/19 2013

## 2019-09-27 NOTE — Discharge Instructions (Signed)
  You may take 500mg  acetaminophen every 4-6 hours or in combination with ibuprofen 400-600mg  every 6-8 hours as needed for pain, inflammation, and fever.  Be sure to well hydrated with clear liquids and get at least 8 hours of sleep at night, preferably more while sick.   Please follow up with family medicine in 1 week if needed.  The cough medication prescribed this evening can cause drowsiness. Caution when driving. Do not take more than prescribed. Do not take with other sedating medications such as Benadryl or sleep aids to help prevent accidental overdose.

## 2019-10-12 ENCOUNTER — Other Ambulatory Visit: Payer: Self-pay

## 2019-10-12 ENCOUNTER — Encounter: Payer: Self-pay | Admitting: Internal Medicine

## 2019-10-12 ENCOUNTER — Ambulatory Visit: Payer: BC Managed Care – PPO | Admitting: Internal Medicine

## 2019-10-12 VITALS — BP 148/90 | HR 70 | Ht 72.0 in | Wt 199.0 lb

## 2019-10-12 DIAGNOSIS — E785 Hyperlipidemia, unspecified: Secondary | ICD-10-CM | POA: Diagnosis not present

## 2019-10-12 DIAGNOSIS — J399 Disease of upper respiratory tract, unspecified: Secondary | ICD-10-CM

## 2019-10-12 DIAGNOSIS — E1165 Type 2 diabetes mellitus with hyperglycemia: Secondary | ICD-10-CM | POA: Diagnosis not present

## 2019-10-12 DIAGNOSIS — J989 Respiratory disorder, unspecified: Secondary | ICD-10-CM

## 2019-10-12 LAB — POCT GLYCOSYLATED HEMOGLOBIN (HGB A1C): Hemoglobin A1C: 5 % (ref 4.0–5.6)

## 2019-10-12 MED ORDER — FLUTICASONE PROPIONATE 50 MCG/ACT NA SUSP: 1.0000 | Freq: Every day | NASAL | 0 refills | Status: AC

## 2019-10-12 MED ORDER — METFORMIN HCL 500 MG PO TABS
500.0000 mg | ORAL_TABLET | Freq: Every day | ORAL | 1 refills | Status: DC
Start: 2019-10-12 — End: 2021-02-09

## 2019-10-12 NOTE — Patient Instructions (Signed)
-   Keep up the good work ! - Decrease metformin 500 mg to 1 tablet with Breakfast only    - take Flonase 1 spray to each nostril for 1-2 weeks - Start Calritin or zyrtec daily

## 2019-10-12 NOTE — Progress Notes (Addendum)
Name: Darrell Santos  Age/ Sex: 54 y.o., male   MRN/ DOB: [id number], 1965-11-14     PCP: Cassandria Anger, MD   Reason for Endocrinology Evaluation: Type 2 Diabetes Mellitus  Initial Endocrine Consultative Visit: 03/07/2019    PATIENT IDENTIFIER: [name] is a 54 y.o. male with a past medical history of HTN and T2DM. The patient has followed with Endocrinology clinic since 03/07/2019 for consultative assistance with management of his diabetes.  DIABETIC HISTORY:  [name] was diagnosed with T2DM in 01/2019, he presented to the ED with symptomatic hyperglycemia . He was discharged on Metformin. He was on Repaglinide for a short period of time but was discontinued in 02/2019 due to hypoglycemia.His hemoglobin A1c has ranged from 5.6% in 05/2019, peaking at 9.4% in 02/2019.   SUBJECTIVE:   During the last visit (03/07/2019): A1c 9.4% , we increased Metformin , decreased repaglinide  Today (10/12/2019): [name] is here for a follow up on diabetes management.  He checks his blood sugars 1 times daily. The patient has had hypoglycemic episodes since the last clinic visit, which typically occur 1 x /week  - most often occurring in the late afternoon .  Denies nausea diarrhea or vomiting   Pt had URi symptoms approximately a month ago, but continued with lingering congestion and dry cough   HOME DIABETES REGIMEN:  Metformin 500 mg 2 tabs BID   Statin: No ACE-I/ARB: yes    METER DOWNLOAD SUMMARY: 8/19-9/17/2021 Average Number Tests/Day = 0.5 Overall Mean FS Glucose = 121 Standard Deviation = 10  BG Ranges: Low = 98 High = 134    Hypoglycemic Events/30 Days: BG < 50 = 0 Episodes of symptomatic severe hypoglycemia = 0      DIABETIC COMPLICATIONS: Microvascular complications:    Denies: CKD, retinopathy, neuropathy  Last eye exam: Completed 05/30/2019  Macrovascular complications:    Denies: CAD, PVD, CVA   HISTORY:  Past Medical  History:  Past Medical History:  Diagnosis Date  . Allergic rhinitis   . Allergy   . Anxiety   . Asthma   . Diabetes mellitus without complication (Darrell Santos)   . GERD (gastroesophageal reflux disease)   . HTN (hypertension)   . Hyperlipidemia    borderline  . Rosacea    Past Surgical History:  Past Surgical History:  Procedure Laterality Date  . CHOLECYSTECTOMY    . COLONOSCOPY    . NASAL SINUS SURGERY    . TOTAL HIP ARTHROPLASTY      Social History:  reports that he has never smoked. He quit smokeless tobacco use about 22 years ago. He reports current alcohol use of about 30.0 standard drinks of alcohol per week. He reports that he does not use drugs. Family History:  Family History  Problem Relation Age of Onset  . Hypertension Other   . Hypertension Other   . Diabetes Other        1st degree relative   . Asthma Mother   . Hypertension Father   . Diabetes Father   . Colon polyps Neg Hx   . Colon cancer Neg Hx   . Esophageal cancer Neg Hx   . Rectal cancer Neg Hx   . Stomach cancer Neg Hx      HOME MEDICATIONS: Allergies as of 10/12/2019      Reactions   Midazolam Shortness Of Breath   (Pre-op)- "dificulty breathing with associated heaviness in chest"    Molds & Smuts Shortness Of  Breath   Other Anaphylaxis   Cats and nuts   Pollen Extract       Medication List       Accurate as of October 12, 2019  4:14 PM. If you have any questions, ask your nurse or doctor.        albuterol 108 (90 Base) MCG/ACT inhaler Commonly known as: ProAir HFA Inhale 2 puffs into the lungs every 4 (four) hours as needed for wheezing or shortness of breath.   clonazePAM 0.5 MG tablet Commonly known as: KLONOPIN Take 1 tablet (0.5 mg total) by mouth at bedtime as needed.   escitalopram 10 MG tablet Commonly known as: LEXAPRO TAKE 1 TABLET DAILY   fenofibrate 160 MG tablet TAKE 1 TABLET DAILY   metFORMIN 500 MG tablet Commonly known as: GLUCOPHAGE Take 1 tablet (500 mg  total) by mouth daily with breakfast AND 2 tablets (1,000 mg total) daily with supper.   olmesartan-hydrochlorothiazide 20-12.5 MG tablet Commonly known as: BENICAR HCT TAKE 1 TABLET DAILY   OneTouch Verio test strip Generic drug: glucose blood Use as instructed to test blood sugar 3 times daily E11.9   OT ULTRA/FASTTK CNTRL SOLN Soln 1 Bottle by Does not apply route as directed.   pantoprazole 40 MG tablet Commonly known as: PROTONIX TAKE 1 TABLET DAILY   promethazine-dextromethorphan 6.25-15 MG/5ML syrup Commonly known as: PROMETHAZINE-DM Take 5 mLs by mouth 4 (four) times daily as needed for cough.   Qvar 80 MCG/ACT inhaler Generic drug: beclomethasone INHALE 1 PUFF INTO THE LUNGS TWICE A DAY        OBJECTIVE:   Vital Signs: BP (!) 148/90 (BP Location: Left Arm, Patient Position: Sitting, Cuff Size: Normal)   Pulse 70   Ht 6' (1.829 m)   Wt 199 lb (90.3 kg)   SpO2 93%   BMI 26.99 kg/m   Wt Readings from Last 3 Encounters:  10/12/19 199 lb (90.3 kg)  08/14/19 195 lb (88.5 kg)  06/05/19 197 lb 9.6 oz (89.6 kg)     Exam: General: Pt appears well and is in NAD  Lungs: Clear with good BS bilat with no rales, rhonchi, or wheezes  Heart: RRR with normal S1 and S2 and no gallops; no murmurs; no rub  Abdomen: Normoactive bowel sounds, soft, nontender, without masses or organomegaly palpable  Extremities: No pretibial edema.  Skin: Normal texture and temperature to palpation.  Neuro: MS is good with appropriate affect, pt is alert and Ox3        DM foot exam: 03/07/2019  The skin of the feet is intact without sores or ulcerations, pt with callous formation at the medial aspect of the 1st MT joint The pedal pulses are 2+ on right and 2+ on left. The sensation is intact to a screening 5.07, 10 gram monofilament bilaterally       DATA REVIEWED:  Lab Results  Component Value Date   HGBA1C 5.0 10/12/2019   HGBA1C 5.4 06/05/2019   HGBA1C 9.4 (H) 02/19/2019    Lab Results  Component Value Date   MICROALBUR 0.8 03/07/2019   LDLCALC 124 (H) 08/14/2019   CREATININE 0.91 08/14/2019   Lab Results  Component Value Date   MICRALBCREAT 0.5 03/07/2019     Lab Results  Component Value Date   CHOL 203 (H) 08/14/2019   HDL 63 08/14/2019   LDLCALC 124 (H) 08/14/2019   LDLDIRECT 158.0 08/09/2018   TRIG 65 08/14/2019   CHOLHDL 3.2 08/14/2019  ASSESSMENT / PLAN / RECOMMENDATIONS:   1) Type 2 Diabetes Mellitus, Optimally controlled, Without complications - Most recent A1c of 5.0 %. Goal A1c < 7.0%.    - He continues to do very well with glycemic control, will reduce metformin as below, the goal is for an A1c of 6.5-7.0 %    MEDICATIONS:  Decrease Metformin 500 mg , 1 tablet with Breakfast   EDUCATION / INSTRUCTIONS:  BG monitoring instructions: Patient is instructed to check his blood sugars 3 times a week .  Call [facility] if: BG persistently < 70 or > 300. . I reviewed the Rule of 15 for the treatment of hypoglycemia in detail with the patient. Literature supplied.     2) Diabetic complications:   Eye: Does not have known diabetic retinopathy. Up to date   Neuro/ Feet: Does not have known diabetic peripheral neuropathy.  Renal: Patient does not have known baseline CKD. He is on an ACEI/ARB at present.    3) Dyslipidemia: Patient is not on a statin.  He is on fenofibrate.  We discussed the cardiovascular benefits of statins, we also discussed the ADA recommendations about statin use in pt between ages 57-75 . He is reluctant to start statin therapy due to history of elevated LFT's. His elevated LFT's were due to hepatic steatosis which have normalized. He is not ready at this time   4) Upper Airway Syndrome:  - On exam lungs are clear - He was advised to start Flonase and OTC antihistamine such as zyrtec or Claritin.   F/U in 4 months     Signed electronically by: Mack Guise,  MD  [facility] Endocrinology  [facility] [address] [place], [place] [address] Phone: [phone] FAX: [phone]   CC: Cassandria Anger, MD [place] [address] Phone: [phone]  Fax: [phone]  Return to Endocrinology clinic as below: No future appointments.

## 2019-10-15 NOTE — Addendum Note (Signed)
Addended by: Dorita Sciara on: 10/15/2019 07:36 AM   Modules accepted: Level of Service

## 2019-10-16 DIAGNOSIS — Z96641 Presence of right artificial hip joint: Secondary | ICD-10-CM | POA: Diagnosis not present

## 2019-10-16 DIAGNOSIS — Z09 Encounter for follow-up examination after completed treatment for conditions other than malignant neoplasm: Secondary | ICD-10-CM | POA: Diagnosis not present

## 2019-10-16 DIAGNOSIS — Z471 Aftercare following joint replacement surgery: Secondary | ICD-10-CM | POA: Diagnosis not present

## 2019-10-16 DIAGNOSIS — Z96643 Presence of artificial hip joint, bilateral: Secondary | ICD-10-CM | POA: Diagnosis not present

## 2019-10-16 DIAGNOSIS — M898X5 Other specified disorders of bone, thigh: Secondary | ICD-10-CM | POA: Diagnosis not present

## 2019-10-16 DIAGNOSIS — Z888 Allergy status to other drugs, medicaments and biological substances status: Secondary | ICD-10-CM | POA: Diagnosis not present

## 2019-10-16 DIAGNOSIS — Z96642 Presence of left artificial hip joint: Secondary | ICD-10-CM | POA: Diagnosis not present

## 2019-11-15 ENCOUNTER — Other Ambulatory Visit: Payer: Self-pay | Admitting: Internal Medicine

## 2019-11-23 ENCOUNTER — Ambulatory Visit: Payer: BC Managed Care – PPO

## 2019-11-26 ENCOUNTER — Other Ambulatory Visit: Payer: Self-pay

## 2019-11-26 ENCOUNTER — Ambulatory Visit (INDEPENDENT_AMBULATORY_CARE_PROVIDER_SITE_OTHER): Payer: BC Managed Care – PPO

## 2019-11-26 DIAGNOSIS — Z23 Encounter for immunization: Secondary | ICD-10-CM | POA: Diagnosis not present

## 2020-01-14 ENCOUNTER — Other Ambulatory Visit (INDEPENDENT_AMBULATORY_CARE_PROVIDER_SITE_OTHER): Payer: BC Managed Care – PPO

## 2020-01-14 ENCOUNTER — Encounter: Payer: Self-pay | Admitting: Internal Medicine

## 2020-01-14 ENCOUNTER — Telehealth (INDEPENDENT_AMBULATORY_CARE_PROVIDER_SITE_OTHER): Payer: BC Managed Care – PPO | Admitting: Internal Medicine

## 2020-01-14 DIAGNOSIS — R1013 Epigastric pain: Secondary | ICD-10-CM | POA: Diagnosis not present

## 2020-01-14 DIAGNOSIS — R110 Nausea: Secondary | ICD-10-CM | POA: Diagnosis not present

## 2020-01-14 DIAGNOSIS — R197 Diarrhea, unspecified: Secondary | ICD-10-CM

## 2020-01-14 LAB — CBC WITH DIFFERENTIAL/PLATELET
Basophils Absolute: 0 10*3/uL (ref 0.0–0.1)
Basophils Relative: 0.4 % (ref 0.0–3.0)
Eosinophils Absolute: 3.5 10*3/uL — ABNORMAL HIGH (ref 0.0–0.7)
Eosinophils Relative: 38 % — ABNORMAL HIGH (ref 0.0–5.0)
HCT: 43.8 % (ref 39.0–52.0)
Hemoglobin: 15.2 g/dL (ref 13.0–17.0)
Lymphocytes Relative: 20.7 % (ref 12.0–46.0)
Lymphs Abs: 1.9 10*3/uL (ref 0.7–4.0)
MCHC: 34.8 g/dL (ref 30.0–36.0)
MCV: 91.2 fl (ref 78.0–100.0)
Monocytes Absolute: 0.5 10*3/uL (ref 0.1–1.0)
Monocytes Relative: 5.5 % (ref 3.0–12.0)
Neutro Abs: 3.2 10*3/uL (ref 1.4–7.7)
Neutrophils Relative %: 35.4 % — ABNORMAL LOW (ref 43.0–77.0)
Platelets: 177 10*3/uL (ref 150.0–400.0)
RBC: 4.8 Mil/uL (ref 4.22–5.81)
RDW: 13.1 % (ref 11.5–15.5)
WBC: 9.1 10*3/uL (ref 4.0–10.5)

## 2020-01-14 LAB — COMPREHENSIVE METABOLIC PANEL
ALT: 45 U/L (ref 0–53)
AST: 24 U/L (ref 0–37)
Albumin: 4.6 g/dL (ref 3.5–5.2)
Alkaline Phosphatase: 46 U/L (ref 39–117)
BUN: 12 mg/dL (ref 6–23)
CO2: 28 mEq/L (ref 19–32)
Calcium: 9.3 mg/dL (ref 8.4–10.5)
Chloride: 103 mEq/L (ref 96–112)
Creatinine, Ser: 0.89 mg/dL (ref 0.40–1.50)
GFR: 96.77 mL/min (ref >60.00)
Glucose, Bld: 112 mg/dL — ABNORMAL HIGH (ref 70–99)
Potassium: 3.8 mEq/L (ref 3.5–5.1)
Sodium: 138 mEq/L (ref 135–145)
Total Bilirubin: 0.7 mg/dL (ref 0.2–1.2)
Total Protein: 7.5 g/dL (ref 6.0–8.3)

## 2020-01-14 LAB — LIPASE: Lipase: 34 U/L (ref 11.0–59.0)

## 2020-01-14 LAB — AMYLASE: Amylase: 33 U/L (ref 27–131)

## 2020-01-14 NOTE — Progress Notes (Signed)
Virtual Visit via Video Note  I connected with Darrell Santos on 01/14/20 at  3:00 PM EST by a video enabled telemedicine application and verified that I am speaking with the correct person using two identifiers.   I discussed the limitations of evaluation and management by telemedicine and the availability of in person appointments. The patient expressed understanding and agreed to proceed.  Present for the visit:  Myself, Dr Billey Gosling, Emelia Salisbury.  The patient is currently at home and I am in the office.    No referring provider.    History of Present Illness: This is an acute visit for stomach pain, nausea, diarrhea.  For the past 10 days he has had abd bloating, abdominal soreness, diarrhea daily.  A lot of this soreness in his abdomen has been in his lower abdomen-and cramping associated with diarrhea.  He has had multiple episodes of diarrhea day-the past few days has been worse.  He denies any blood in the stool or black stool.  He has not vomited.  He denies fevers.  The past couple of days he has had pain in the upper abdomen just under the ribs.  He denies any prior episodes and states it does not feel like a stomach bug.  He denies sick contacts.  He states he is concerned about the possibility of pancreatitis because he does drink about half pint of bourbon a day-sometimes more.  He denies any alcohol binge prior to the symptoms starting.  He has GERD, but feels that is well controlled with his current medication.  He had colonoscopy in 2019 and he had some polyps removed-no evidence of diverticulosis.  He had his gallbladder removed a few years ago.    Review of Systems  Constitutional: Negative for chills and fever.       Loss of appetite  HENT: Negative for congestion, sinus pain and sore throat.   Gastrointestinal: Positive for abdominal pain (epigastric, mild lower abdominal cramping), diarrhea and nausea. Negative for blood in stool, heartburn, melena and  vomiting.  Genitourinary: Negative for dysuria and frequency.  Neurological: Positive for headaches (one morning). Negative for dizziness.      Social History   Socioeconomic History  . Marital status: Married    Spouse name: Not on file  . Number of children: Not on file  . Years of education: Not on file  . Highest education level: Not on file  Occupational History  . Occupation: Product/process development scientist: [employer]  Tobacco Use  . Smoking status: Never Smoker  . Smokeless tobacco: Former Network engineer  . Vaping Use: Never used  Substance and Sexual Activity  . Alcohol use: Yes    Alcohol/week: 30.0 standard drinks    Types: 30 Standard drinks or equivalent per week    Comment: 3-5 per day  . Drug use: No  . Sexual activity: Not on file  Other Topics Concern  . Not on file  Social History Narrative  . Not on file   Social Determinants of Health   Financial Resource Strain: Not on file  Food Insecurity: Not on file  Transportation Needs: Not on file  Physical Activity: Not on file  Stress: Not on file  Social Connections: Not on file     Observations/Objective: Appears well in NAD Breathing normally Skin appears warm and dry  Assessment and Plan:  Diarrhea, nausea, upper abdominal pain: Acute Started about 10 days ago Multiple episodes of diarrhea today, nonbloody  No fever Symptoms not improving He does drink alcohol daily Possible pancreatitis, enteritis, diverticulitis seems less likely since his colonoscopy in 2019 did not show any evidence of diverticulosis Blood work today-CBC, CMP, amylase and lipase CT abdomen pelvis with contrast ordered to be done today or tomorrow Advised to go to a primarily liquid diet and to push as much fluids as possible given the diarrhea Hold Metformin until he is eating normally Monitor BP-May need to hold BP med  Advised that if his pain worsens he may need to go to the emergency room   Follow Up  Instructions:    I discussed the assessment and treatment plan with the patient. The patient was provided an opportunity to ask questions and all were answered. The patient agreed with the plan and demonstrated an understanding of the instructions.   The patient was advised to call back or seek an in-person evaluation if the symptoms worsen or if the condition fails to improve as anticipated.    Binnie Rail, MD

## 2020-01-15 ENCOUNTER — Other Ambulatory Visit: Payer: Self-pay

## 2020-01-15 ENCOUNTER — Ambulatory Visit (INDEPENDENT_AMBULATORY_CARE_PROVIDER_SITE_OTHER)
Admission: RE | Admit: 2020-01-15 | Discharge: 2020-01-15 | Disposition: A | Discharge status: Home / Self Care | Payer: BC Managed Care – PPO | Source: Ambulatory Visit | Attending: Internal Medicine | Admitting: Internal Medicine

## 2020-01-15 DIAGNOSIS — R197 Diarrhea, unspecified: Secondary | ICD-10-CM

## 2020-01-15 DIAGNOSIS — R1013 Epigastric pain: Secondary | ICD-10-CM

## 2020-01-15 DIAGNOSIS — R110 Nausea: Secondary | ICD-10-CM | POA: Diagnosis not present

## 2020-01-15 DIAGNOSIS — I700 Atherosclerosis of aorta: Secondary | ICD-10-CM | POA: Diagnosis not present

## 2020-01-15 MED ORDER — IOHEXOL 300 MG/ML  SOLN
100.0000 mL | Freq: Once | INTRAMUSCULAR | Status: AC | PRN
  Administered 2020-01-15: 100 mL via INTRAVENOUS

## 2020-02-05 ENCOUNTER — Other Ambulatory Visit: Payer: Self-pay | Admitting: Internal Medicine

## 2020-04-08 ENCOUNTER — Ambulatory Visit: Payer: BC Managed Care – PPO | Admitting: Internal Medicine

## 2020-04-08 ENCOUNTER — Encounter: Payer: Self-pay | Admitting: Internal Medicine

## 2020-04-08 ENCOUNTER — Other Ambulatory Visit: Payer: Self-pay

## 2020-04-08 VITALS — BP 136/86 | HR 72 | Ht 72.0 in | Wt 211.0 lb

## 2020-04-08 DIAGNOSIS — E119 Type 2 diabetes mellitus without complications: Secondary | ICD-10-CM | POA: Diagnosis not present

## 2020-04-08 DIAGNOSIS — E785 Hyperlipidemia, unspecified: Secondary | ICD-10-CM

## 2020-04-08 LAB — POCT GLYCOSYLATED HEMOGLOBIN (HGB A1C): Hemoglobin A1C: 5.6 % (ref 4.0–5.6)

## 2020-04-08 MED ORDER — ROSUVASTATIN CALCIUM 5 MG PO TABS: 5.0000 mg | ORAL_TABLET | Freq: Every day | ORAL | 1 refills | Status: DC

## 2020-04-08 NOTE — Patient Instructions (Signed)
-   Continue metformin 500 mg 1 tablet with Breakfast only  - Crestor 5 mg, 1 tablet at  Bedtime

## 2020-04-08 NOTE — Progress Notes (Signed)
Name: Darrell Santos  Age/ Sex: 55 y.o., male   MRN/ DOB: [id number], 1965-02-01     PCP: Cassandria Anger, MD   Reason for Endocrinology Evaluation: Type 2 Diabetes Mellitus  Initial Endocrine Consultative Visit: 03/07/2019    PATIENT IDENTIFIER: Darrell Santos is a 55 y.o. male with a past medical history of HTN and T2DM. The patient has followed with Endocrinology clinic since 03/07/2019 for consultative assistance with management of his diabetes.  DIABETIC HISTORY:  Darrell Santos was diagnosed with T2DM in 01/2019, he presented to the ED with symptomatic hyperglycemia . He was discharged on Metformin. He was on Repaglinide for a short period of time but was discontinued in 02/2019 due to hypoglycemia.His hemoglobin A1c has ranged from 5.6% in 05/2019, peaking at 9.4% in 02/2019.   SUBJECTIVE:   During the last visit (10/12/2019): A1c 5.0 % , we decreased  Metformin   Today (04/08/2020): Darrell Santos is here for a follow up on diabetes management.  He checks his blood sugars 3 times weekly. The patient has not had hypoglycemic episodes since the last clinic visit.  Denies nausea diarrhea or vomiting     HOME DIABETES REGIMEN:  Metformin 500 mg  1 tab daily    Statin: No ACE-I/ARB: yes    METER DOWNLOAD SUMMARY: 3/1-3/15/2022 Average Number Tests/Day = 0.4 Overall Mean FS Glucose = 122 Standard Deviation = 10  BG Ranges: Low = 108 High =  135    Hypoglycemic Events/30 Days: BG < 50 = 0 Episodes of symptomatic severe hypoglycemia = 0      DIABETIC COMPLICATIONS: Microvascular complications:    Denies: CKD, retinopathy, neuropathy  Last eye exam: Completed 05/30/2019  Macrovascular complications:    Denies: CAD, PVD, CVA   HISTORY:  Past Medical History:  Past Medical History:  Diagnosis Date  . Allergic rhinitis   . Allergy   . Anxiety   . Asthma   . Diabetes mellitus without complication ([place])   . GERD (gastroesophageal reflux  disease)   . HTN (hypertension)   . Hyperlipidemia    borderline  . Rosacea    Past Surgical History:  Past Surgical History:  Procedure Laterality Date  . CHOLECYSTECTOMY    . COLONOSCOPY    . NASAL SINUS SURGERY    . TOTAL HIP ARTHROPLASTY      Social History:  reports that he has never smoked. He quit smokeless tobacco use about 22 years ago. He reports current alcohol use of about 30.0 standard drinks of alcohol per week. He reports that he does not use drugs. Family History:  Family History  Problem Relation Age of Onset  . Hypertension Other   . Hypertension Other   . Diabetes Other        1st degree relative   . Asthma Mother   . Hypertension Father   . Diabetes Father   . Colon polyps Neg Hx   . Colon cancer Neg Hx   . Esophageal cancer Neg Hx   . Rectal cancer Neg Hx   . Stomach cancer Neg Hx      HOME MEDICATIONS: Allergies as of 04/08/2020      Reactions   Midazolam Shortness Of Breath   (Pre-op)- "dificulty breathing with associated heaviness in chest"    Molds & Smuts Shortness Of Breath   Other Anaphylaxis   Cats and nuts   Pollen Extract       Medication List  Accurate as of April 08, 2020  4:29 PM. If you have any questions, ask your nurse or doctor.        albuterol 108 (90 Base) MCG/ACT inhaler Commonly known as: ProAir HFA Inhale 2 puffs into the lungs every 4 (four) hours as needed for wheezing or shortness of breath.   clonazePAM 0.5 MG tablet Commonly known as: KLONOPIN TAKE 1 TABLET AT BEDTIME AS NEEDED   escitalopram 10 MG tablet Commonly known as: LEXAPRO TAKE 1 TABLET DAILY   fenofibrate 160 MG tablet TAKE 1 TABLET DAILY   fluticasone 50 MCG/ACT nasal spray Commonly known as: FLONASE Place 1 spray into both nostrils daily.   metFORMIN 500 MG tablet Commonly known as: GLUCOPHAGE Take 1 tablet (500 mg total) by mouth daily with breakfast.   olmesartan-hydrochlorothiazide 20-12.5 MG tablet Commonly known as:  BENICAR HCT TAKE 1 TABLET DAILY   OneTouch Verio test strip Generic drug: glucose blood Use as instructed to test blood sugar 3 times daily E11.9   OT ULTRA/FASTTK CNTRL SOLN Soln 1 Bottle by Does not apply route as directed.   pantoprazole 40 MG tablet Commonly known as: PROTONIX TAKE 1 TABLET DAILY   promethazine-dextromethorphan 6.25-15 MG/5ML syrup Commonly known as: PROMETHAZINE-DM Take 5 mLs by mouth 4 (four) times daily as needed for cough.   Qvar RediHaler 80 MCG/ACT inhaler Generic drug: beclomethasone USE 1 INHALATION TWICE A DAY   rosuvastatin 5 MG tablet Commonly known as: Crestor Take 1 tablet (5 mg total) by mouth daily. Started by: Dorita Sciara, MD        OBJECTIVE:   Vital Signs: BP 136/86   Pulse 72   Ht 6' (1.829 m)   Wt 211 lb (95.7 kg)   SpO2 98%   BMI 28.62 kg/m   Wt Readings from Last 3 Encounters:  04/08/20 211 lb (95.7 kg)  10/12/19 199 lb (90.3 kg)  08/14/19 195 lb (88.5 kg)     Exam: General: Pt appears well and is in NAD  Lungs: Clear with good BS bilat with no rales, rhonchi, or wheezes  Heart: RRR with normal S1 and S2 and no gallops; no murmurs; no rub  Abdomen: Normoactive bowel sounds, soft, nontender, without masses or organomegaly palpable  Extremities: No pretibial edema.  Skin: Normal texture and temperature to palpation.  Neuro: MS is good with appropriate affect, pt is alert and Ox3        DM foot exam: 04/08/2020  The skin of the feet is intact without sores or ulcerations, pt with callous formation at the medial aspect of the 1st MT joint The pedal pulses are 2+ on right and 2+ on left. The sensation is intact to a screening 5.07, 10 gram monofilament bilaterally       DATA REVIEWED:  Lab Results  Component Value Date   HGBA1C 5.6 04/08/2020   HGBA1C 5.0 10/12/2019   HGBA1C 5.4 06/05/2019   Lab Results  Component Value Date   MICROALBUR 0.8 03/07/2019   LDLCALC 124 (H) 08/14/2019    CREATININE 0.89 01/14/2020   Lab Results  Component Value Date   MICRALBCREAT 0.5 03/07/2019     Lab Results  Component Value Date   CHOL 203 (H) 08/14/2019   HDL 63 08/14/2019   LDLCALC 124 (H) 08/14/2019   LDLDIRECT 158.0 08/09/2018   TRIG 65 08/14/2019   CHOLHDL 3.2 08/14/2019         ASSESSMENT / PLAN / RECOMMENDATIONS:   1) Type 2 Diabetes Mellitus,  Optimally controlled, Without complications - Most recent A1c of 5.6 %. Goal A1c < 7.0%.    - He continues to do very well with glycemic control,he was given the option of stopping metformin and seeing how he does without it, he was advised to start it should his fasting Bg's increased to 150 mg/dL     MEDICATIONS:  Metformin 500 mg , 1 tablet with Breakfast   EDUCATION / INSTRUCTIONS:  BG monitoring instructions: Patient is instructed to check his blood sugars 3 times a week .  Call [facility] if: BG persistently < 70  . I reviewed the Rule of 15 for the treatment of hypoglycemia in detail with the patient. Literature supplied.     2) Diabetic complications:   Eye: Does not have known diabetic retinopathy. Up to date   Neuro/ Feet: Does not have known diabetic peripheral neuropathy.  Renal: Patient does not have known baseline CKD. He is on an ACEI/ARB at present.    3) Dyslipidemia:   He is on fenofibrate.  We discussed the cardiovascular benefits of statins, he agreed to starting stain therapy. He is concerned about his hepatic function, pt with hx of hepatic steatosis   Medication  Rosuvastatin 5 mg daily  Fenofibrate 160 mg daily      F/U in 4 months     Signed electronically by: Mack Guise, MD  [facility] Endocrinology  [facility] [address] [place], [place] [address] Phone: [phone] FAX: [phone]   CC: Cassandria Anger, MD [place] [address] Phone: [phone]  Fax: [phone]  Return to  Endocrinology clinic as below: Future Appointments  Date Time Provider [place]  08/12/2020  3:20 PM Shamleffer, Melanie Crazier, MD [facility] [place]

## 2020-08-08 ENCOUNTER — Other Ambulatory Visit: Payer: Self-pay | Admitting: Internal Medicine

## 2020-08-11 ENCOUNTER — Other Ambulatory Visit: Payer: Self-pay | Admitting: Internal Medicine

## 2020-08-12 ENCOUNTER — Other Ambulatory Visit: Payer: Self-pay

## 2020-08-12 ENCOUNTER — Ambulatory Visit: Payer: BC Managed Care – PPO | Admitting: Internal Medicine

## 2020-08-12 ENCOUNTER — Encounter: Payer: Self-pay | Admitting: Internal Medicine

## 2020-08-12 VITALS — BP 138/86 | HR 84 | Ht 72.0 in | Wt 207.0 lb

## 2020-08-12 DIAGNOSIS — E785 Hyperlipidemia, unspecified: Secondary | ICD-10-CM | POA: Diagnosis not present

## 2020-08-12 DIAGNOSIS — E119 Type 2 diabetes mellitus without complications: Secondary | ICD-10-CM | POA: Diagnosis not present

## 2020-08-12 DIAGNOSIS — M79672 Pain in left foot: Secondary | ICD-10-CM | POA: Diagnosis not present

## 2020-08-12 LAB — POCT GLYCOSYLATED HEMOGLOBIN (HGB A1C): Hemoglobin A1C: 5.5 % (ref 4.0–5.6)

## 2020-08-12 MED ORDER — FENOFIBRATE 160 MG PO TABS
160.0000 mg | ORAL_TABLET | Freq: Every day | ORAL | 0 refills | Status: DC
Start: 2020-08-12 — End: 2020-08-12

## 2020-08-12 MED ORDER — FENOFIBRATE 160 MG PO TABS
160.0000 mg | ORAL_TABLET | Freq: Every day | ORAL | 3 refills | Status: DC
Start: 2020-08-12 — End: 2021-05-21

## 2020-08-12 NOTE — Progress Notes (Signed)
Name: Darrell Santos  Age/ Sex: 55 y.o., male   MRN/ DOB: [id number], 1965/04/23     PCP: Cassandria Anger, MD   Reason for Endocrinology Evaluation: Type 2 Diabetes Mellitus  Initial Endocrine Consultative Visit: 03/07/2019    PATIENT IDENTIFIER: Darrell Santos is a 55 y.o. male with a past medical history of HTN and T2DM. The patient has followed with Endocrinology clinic since 03/07/2019 for consultative assistance with management of his diabetes.  DIABETIC HISTORY:  Darrell Santos was diagnosed with T2DM in 01/2019, he presented to the ED with symptomatic hyperglycemia . He was discharged on Metformin. He was on Repaglinide for a short period of time but was discontinued in 02/2019 due to hypoglycemia.His hemoglobin A1c has ranged from 5.6% in 05/2019, peaking at 9.4% in 02/2019.   SUBJECTIVE:   During the last visit (04/08/2020): A1c 5.6 % , we continued Metformin   Today (08/12/2020): Darrell Santos is here for a follow up on diabetes management.  He checks his blood sugars 3 times weekly. The patient has not had hypoglycemic episodes since the last clinic visit.  Denies nausea diarrhea or vomiting  He is c/o 3rd toe numbness , burning and pain , worse with walking , this has been going on for years but has been worsening in nature.     HOME DIABETES REGIMEN:  Metformin 500 mg  1 tab daily      Statin:yes  ACE-I/ARB: yes    METER DOWNLOAD SUMMARY:Did not bring     DIABETIC COMPLICATIONS: Microvascular complications:    Denies: CKD, retinopathy, neuropathy Last eye exam: Completed 05/30/2019   Macrovascular complications:    Denies: CAD, PVD, CVA    HISTORY:  Past Medical History:  Past Medical History:  Diagnosis Date   Allergic rhinitis    Allergy    Anxiety    Asthma    Diabetes mellitus without complication (HCC)    GERD (gastroesophageal reflux disease)    HTN (hypertension)    Hyperlipidemia    borderline   Rosacea    Past Surgical  History:  Past Surgical History:  Procedure Laterality Date   CHOLECYSTECTOMY     COLONOSCOPY     NASAL SINUS SURGERY     TOTAL HIP ARTHROPLASTY     Social History:  reports that he has never smoked. He quit smokeless tobacco use about 23 years ago. He reports current alcohol use of about 30.0 standard drinks of alcohol per week. He reports that he does not use drugs. Family History:  Family History  Problem Relation Age of Onset   Hypertension Other    Hypertension Other    Diabetes Other        1st degree relative    Asthma Mother    Hypertension Father    Diabetes Father    Colon polyps Neg Hx    Colon cancer Neg Hx    Esophageal cancer Neg Hx    Rectal cancer Neg Hx    Stomach cancer Neg Hx      HOME MEDICATIONS: Allergies as of 08/12/2020       Reactions   Midazolam Shortness Of Breath   (Pre-op)- "dificulty breathing with associated heaviness in chest"    Molds & Smuts Shortness Of Breath   Other Anaphylaxis   Cats and nuts   Pollen Extract         Medication List        Accurate as of August 12, 2020  3:45 PM. If you have any questions, ask your nurse or doctor.          STOP taking these medications    promethazine-dextromethorphan 6.25-15 MG/5ML syrup Commonly known as: PROMETHAZINE-DM Stopped by: Dorita Sciara, MD       TAKE these medications    albuterol 108 (90 Base) MCG/ACT inhaler Commonly known as: ProAir HFA Inhale 2 puffs into the lungs every 4 (four) hours as needed for wheezing or shortness of breath.   clonazePAM 0.5 MG tablet Commonly known as: KLONOPIN TAKE 1 TABLET AT BEDTIME AS NEEDED   escitalopram 10 MG tablet Commonly known as: LEXAPRO TAKE 1 TABLET DAILY   fenofibrate 160 MG tablet Take 1 tablet (160 mg total) by mouth daily. Annual appt w/labs is due in must see provider for future refills What changed: additional instructions Changed by: Generic Provider MyChart   fluticasone 50 MCG/ACT nasal  spray Commonly known as: FLONASE Place 1 spray into both nostrils daily.   metFORMIN 500 MG tablet Commonly known as: GLUCOPHAGE Take 1 tablet (500 mg total) by mouth daily with breakfast.   olmesartan-hydrochlorothiazide 20-12.5 MG tablet Commonly known as: BENICAR HCT TAKE 1 TABLET DAILY   OneTouch Verio test strip Generic drug: glucose blood Use as instructed to test blood sugar 3 times daily E11.9   OT ULTRA/FASTTK CNTRL SOLN Soln 1 Bottle by Does not apply route as directed.   pantoprazole 40 MG tablet Commonly known as: PROTONIX TAKE 1 TABLET DAILY   Qvar RediHaler 80 MCG/ACT inhaler Generic drug: beclomethasone USE 1 INHALATION TWICE A DAY   rosuvastatin 5 MG tablet Commonly known as: Crestor Take 1 tablet (5 mg total) by mouth daily.         OBJECTIVE:   Vital Signs: BP 138/86   Pulse 84   Ht 6' (1.829 m)   Wt 207 lb (93.9 kg)   SpO2 97%   BMI 28.07 kg/m   Wt Readings from Last 3 Encounters:  08/12/20 207 lb (93.9 kg)  04/08/20 211 lb (95.7 kg)  10/12/19 199 lb (90.3 kg)     Exam: General: Pt appears well and is in NAD  Lungs: Clear to auscultation    Heart: RRR with normal S1 and S2 and no gallops; no murmurs; no rub  Abdomen: Normoactive bowel sounds, soft, nontender, without masses or organomegaly palpable  Extremities: No pretibial edema.  Skin: Normal texture and temperature to palpation.  Neuro: MS is good with appropriate affect, pt is alert and Ox3        DM foot exam: 04/08/2020   The skin of the feet is intact without sores or ulcerations, pt with callous formation at the medial aspect of the 1st MT joint The pedal pulses are 2+ on right and 2+ on left. The sensation is intact to a screening 5.07, 10 gram monofilament bilaterally       DATA REVIEWED:  Lab Results  Component Value Date   HGBA1C 5.5 08/12/2020   HGBA1C 5.6 04/08/2020   HGBA1C 5.0 10/12/2019   Results for Santos, Darrell Santos (MRN [id number]) as of 08/14/2020  07:06  Ref. Range 08/12/2020 15:54  Sodium Latest Ref Range: 135 - 145 mEq/L 140  Potassium Latest Ref Range: 3.5 - 5.1 mEq/L 4.0  Chloride Latest Ref Range: 96 - 112 mEq/L 104  CO2 Latest Ref Range: 19 - 32 mEq/L 28  Glucose Latest Ref Range: 70 - 99 mg/dL 71  BUN Latest Ref Range: 6 - 23 mg/dL 10  Creatinine Latest Ref Range: 0.40 - 1.50 mg/dL 0.91  Calcium Latest Ref Range: 8.4 - 10.5 mg/dL 9.8  Alkaline Phosphatase Latest Ref Range: 39 - 117 U/L 35 (L)  Albumin Latest Ref Range: 3.5 - 5.2 g/dL 4.7  AST Latest Ref Range: 0 - 37 U/L 29  ALT Latest Ref Range: 0 - 53 U/L 38  Total Protein Latest Ref Range: 6.0 - 8.3 g/dL 7.2  Total Bilirubin Latest Ref Range: 0.2 - 1.2 mg/dL 0.6  GFR Latest Ref Range: >60.00 mL/min 94.79  Total CHOL/HDL Ratio Unknown 4  Cholesterol Latest Ref Range: 0 - 200 mg/dL 208 (H)  HDL Cholesterol Latest Ref Range: >39.00 mg/dL 53.20  Direct LDL Latest Units: mg/dL 142.0  MICROALB/CREAT RATIO Latest Ref Range: 0.0 - 30.0 mg/g 0.7  NonHDL Unknown 154.47  Triglycerides Latest Ref Range: 0.0 - 149.0 mg/dL 215.0 (H)  VLDL Latest Ref Range: 0.0 - 40.0 mg/dL 43.0 (H)  Vitamin B12 Latest Ref Range: 211 - 911 pg/mL 277  TSH Latest Ref Range: 0.35 - 5.50 uIU/mL 1.68    ASSESSMENT / PLAN / RECOMMENDATIONS:   1) Type 2 Diabetes Mellitus, Optimally controlled, Without complications - Most recent A1c of 5.5 %. Goal A1c < 7.0%.    - He continues to do very well with glycemic control,he attempted to stop it but his BG's increased to 150's and he went back on it.  - He is not having any side effects and will continue    MEDICATIONS: Metformin 500 mg , 1 tablet with Breakfast   EDUCATION / INSTRUCTIONS: BG monitoring instructions: Patient is instructed to check his blood sugars 3 times a week . Call [facility] if: BG persistently < 70  I reviewed the Rule of 15 for the treatment of hypoglycemia in detail with the patient. Literature  supplied.     2) Diabetic complications:  Eye: Does not have known diabetic retinopathy. Up to date  Neuro/ Feet: Does not have known diabetic peripheral neuropathy. Renal: Patient does not have known baseline CKD. He is on an ACEI/ARB at present.     3) Dyslipidemia:   He has not started Rosuvastatin yet, he forgot about it, we again discussed cardiovascular benefits of statins  . He is concerned about his hepatic function, pt with hx of hepatic steatosis  Will refill fenofibrate  Lipids today continue to show elevation  Medication  Start Rosuvastatin 5 mg daily  Continue Fenofibrate 160 mg daily   4) Left foot pain/numbness :  - I am not sure if this is tendonitis vs neuroma  - Will refer to podiatry  - I don;t believe this is related to diabetes as diabetes neuropathy is more symmetrical and his is localized to the left 3rd toe  -TFTs normal, B12 at lower end of normal we will be encouraged to start OTC B12 at 1000 MCG daily  F/U in 4 months     Signed electronically by: Mack Guise, MD  [facility]  [facility] [address] [place], [place] [address] Phone: [phone] FAX: [phone]   CC: Cassandria Anger, MD [place] [address] Phone: [phone]  Fax: [phone]  Return to Endocrinology clinic as below: No future appointments.

## 2020-08-12 NOTE — Patient Instructions (Signed)
-   Continue metformin 500 mg 1 tablet with Breakfast only  - Crestor 5 mg, 1 tablet at  Bedtime

## 2020-08-13 LAB — COMPREHENSIVE METABOLIC PANEL
ALT: 38 U/L (ref 0–53)
AST: 29 U/L (ref 0–37)
Albumin: 4.7 g/dL (ref 3.5–5.2)
Alkaline Phosphatase: 35 U/L — ABNORMAL LOW (ref 39–117)
BUN: 10 mg/dL (ref 6–23)
CO2: 28 mEq/L (ref 19–32)
Calcium: 9.8 mg/dL (ref 8.4–10.5)
Chloride: 104 mEq/L (ref 96–112)
Creatinine, Ser: 0.91 mg/dL (ref 0.40–1.50)
GFR: 94.79 mL/min (ref >60.00)
Glucose, Bld: 71 mg/dL (ref 70–99)
Potassium: 4 mEq/L (ref 3.5–5.1)
Sodium: 140 mEq/L (ref 135–145)
Total Bilirubin: 0.6 mg/dL (ref 0.2–1.2)
Total Protein: 7.2 g/dL (ref 6.0–8.3)

## 2020-08-13 LAB — LIPID PANEL
Cholesterol: 208 mg/dL — ABNORMAL HIGH (ref 0–200)
HDL: 53.2 mg/dL (ref >39.00)
NonHDL: 154.47
Total CHOL/HDL Ratio: 4
Triglycerides: 215 mg/dL — ABNORMAL HIGH (ref 0.0–149.0)
VLDL: 43 mg/dL — ABNORMAL HIGH (ref 0.0–40.0)

## 2020-08-13 LAB — VITAMIN B12: Vitamin B-12: 277 pg/mL (ref 211–911)

## 2020-08-13 LAB — MICROALBUMIN / CREATININE URINE RATIO
Creatinine,U: 103.8 mg/dL
Microalb Creat Ratio: 0.7 mg/g (ref 0.0–30.0)
Microalb, Ur: <0.7 mg/dL (ref 0.0–1.9)

## 2020-08-13 LAB — TSH: TSH: 1.68 u[IU]/mL (ref 0.35–5.50)

## 2020-08-13 LAB — LDL CHOLESTEROL, DIRECT: Direct LDL: 142 mg/dL

## 2020-08-27 DIAGNOSIS — Z96643 Presence of artificial hip joint, bilateral: Secondary | ICD-10-CM | POA: Diagnosis not present

## 2020-08-27 DIAGNOSIS — M47816 Spondylosis without myelopathy or radiculopathy, lumbar region: Secondary | ICD-10-CM | POA: Diagnosis not present

## 2020-08-27 DIAGNOSIS — M545 Low back pain, unspecified: Secondary | ICD-10-CM | POA: Diagnosis not present

## 2020-08-27 DIAGNOSIS — Z888 Allergy status to other drugs, medicaments and biological substances status: Secondary | ICD-10-CM | POA: Diagnosis not present

## 2020-08-27 DIAGNOSIS — Z471 Aftercare following joint replacement surgery: Secondary | ICD-10-CM | POA: Diagnosis not present

## 2020-08-27 DIAGNOSIS — M461 Sacroiliitis, not elsewhere classified: Secondary | ICD-10-CM | POA: Diagnosis not present

## 2020-09-03 DIAGNOSIS — M545 Low back pain, unspecified: Secondary | ICD-10-CM | POA: Diagnosis not present

## 2020-09-03 DIAGNOSIS — Z96642 Presence of left artificial hip joint: Secondary | ICD-10-CM | POA: Diagnosis not present

## 2020-09-03 DIAGNOSIS — Z96641 Presence of right artificial hip joint: Secondary | ICD-10-CM | POA: Diagnosis not present

## 2020-09-05 ENCOUNTER — Ambulatory Visit: Payer: BC Managed Care – PPO | Admitting: Podiatry

## 2020-09-05 ENCOUNTER — Other Ambulatory Visit: Payer: Self-pay

## 2020-09-05 ENCOUNTER — Encounter: Payer: Self-pay | Admitting: Podiatry

## 2020-09-05 ENCOUNTER — Ambulatory Visit (INDEPENDENT_AMBULATORY_CARE_PROVIDER_SITE_OTHER): Payer: BC Managed Care – PPO

## 2020-09-05 DIAGNOSIS — G5762 Lesion of plantar nerve, left lower limb: Secondary | ICD-10-CM | POA: Diagnosis not present

## 2020-09-05 DIAGNOSIS — D3610 Benign neoplasm of peripheral nerves and autonomic nervous system, unspecified: Secondary | ICD-10-CM

## 2020-09-05 DIAGNOSIS — Z8739 Personal history of other diseases of the musculoskeletal system and connective tissue: Secondary | ICD-10-CM | POA: Diagnosis not present

## 2020-09-05 DIAGNOSIS — M778 Other enthesopathies, not elsewhere classified: Secondary | ICD-10-CM | POA: Diagnosis not present

## 2020-09-05 DIAGNOSIS — M79672 Pain in left foot: Secondary | ICD-10-CM

## 2020-09-05 DIAGNOSIS — E781 Pure hyperglyceridemia: Secondary | ICD-10-CM | POA: Insufficient documentation

## 2020-09-05 DIAGNOSIS — E119 Type 2 diabetes mellitus without complications: Secondary | ICD-10-CM | POA: Diagnosis not present

## 2020-09-05 MED ORDER — TRIAMCINOLONE ACETONIDE 10 MG/ML IJ SUSP
10.0000 mg | Freq: Once | INTRAMUSCULAR | Status: AC
  Administered 2020-09-05: 10 mg

## 2020-09-05 NOTE — Progress Notes (Signed)
Subjective:   Patient ID: Darrell Santos, male   DOB: 55 y.o.   MRN: [id number]   HPI 55 year old male presents the office today for concerns of discomfort to his left foot which is been ongoing about 2 years.  He states that he notices pain in the ball of his foot he will get sharp pain in the second and third toes.  He gets office with a pain improves.  Denies any recent injury.  No proximal radiating pain.  He tried changing shoes and the sketchers seem to be more comfortable.  He tried over-the-counter insert with a significant improvement.  He is on ibuprofen for other issues and it seems to help the foot.  No other treatment.  No other concerns.   Review of Systems  All other systems reviewed and are negative.  Past Medical History:  Diagnosis Date   Allergic rhinitis    Allergy    Anxiety    Asthma    Diabetes mellitus without complication (HCC)    GERD (gastroesophageal reflux disease)    HTN (hypertension)    Hyperlipidemia    borderline   Rosacea     Past Surgical History:  Procedure Laterality Date   CHOLECYSTECTOMY     COLONOSCOPY     NASAL SINUS SURGERY     TOTAL HIP ARTHROPLASTY       Current Outpatient Medications:    diazepam (VALIUM) 2 MG tablet, Take one tablet tid for 7 days, Disp: , Rfl:    ibuprofen (ADVIL) 800 MG tablet, Take by mouth., Disp: , Rfl:    albuterol (PROAIR HFA) 108 (90 Base) MCG/ACT inhaler, Inhale 2 puffs into the lungs every 4 (four) hours as needed for wheezing or shortness of breath., Disp: 56 g, Rfl: 3   Blood Glucose Calibration (OT ULTRA/FASTTK CNTRL SOLN) SOLN, 1 Bottle by Does not apply route as directed., Disp: 2 each, Rfl: 0   clonazePAM (KLONOPIN) 0.5 MG tablet, TAKE 1 TABLET AT BEDTIME AS NEEDED, Disp: 90 tablet, Rfl: 0   escitalopram (LEXAPRO) 10 MG tablet, TAKE 1 TABLET DAILY, Disp: 90 tablet, Rfl: 3   esomeprazole (NEXIUM) 40 MG capsule, Take by mouth., Disp: , Rfl:    fenofibrate 160 MG tablet, Take 1 tablet (160 mg  total) by mouth daily. Annual appt w/labs is due in must see provider for future refills, Disp: 90 tablet, Rfl: 3   fluticasone (FLONASE) 50 MCG/ACT nasal spray, Place 1 spray into both nostrils daily., Disp: 15.8 mL, Rfl: 0   glucose blood (ONETOUCH VERIO) test strip, Use as instructed to test blood sugar 3 times daily E11.9, Disp: 100 each, Rfl: 12   ID NOW COVID-19 KIT, TEST AS DIRECTED TODAY, Disp: , Rfl:    metFORMIN (GLUCOPHAGE) 500 MG tablet, Take 1 tablet (500 mg total) by mouth daily with breakfast., Disp: 90 tablet, Rfl: 1   olmesartan-hydrochlorothiazide (BENICAR HCT) 20-12.5 MG tablet, TAKE 1 TABLET DAILY, Disp: 90 tablet, Rfl: 3   pantoprazole (PROTONIX) 40 MG tablet, TAKE 1 TABLET DAILY, Disp: 90 tablet, Rfl: 3   QVAR REDIHALER 80 MCG/ACT inhaler, USE 1 INHALATION TWICE A DAY, Disp: 31.8 g, Rfl: 5   rosuvastatin (CRESTOR) 5 MG tablet, Take 1 tablet (5 mg total) by mouth daily., Disp: 90 tablet, Rfl: 1  Current Facility-Administered Medications:    0.9 %  sodium chloride infusion, 500 mL, Intravenous, Once, Danis, Kirke Corin, MD  Allergies  Allergen Reactions   Midazolam Shortness Of Breath    (Pre-op)- "  dificulty breathing with associated heaviness in chest"    Molds & Smuts Shortness Of Breath   Other Anaphylaxis    Cats and nuts   Pollen Extract           Objective:  Physical Exam  General: AAO x3, NAD  Dermatological: Skin is warm, dry and supple bilateral. There are no open sores, no preulcerative lesions, no rash or signs of infection present.  Vascular: Dorsalis Pedis artery and Posterior Tibial artery pedal pulses are 2/4 bilateral with immedate capillary fill time. There is no pain with calf compression, swelling, warmth, erythema.   Neruologic: Grossly intact via light touch bilateral.  Negative Tinel sign.  Musculoskeletal: Tenderness palpation left second interspace.  Upon palpation there does reproduce symptoms radiating, sharp pain into the second and  third digits.  There is no area of pinpoint tenderness.  No edema.  Muscular strength 5/5 in all groups tested bilateral.  Gait: Unassisted, Nonantalgic.       Assessment:   Left second interspace neuroma     Plan:  -Treatment options discussed including all alternatives, risks, and complications -Etiology of symptoms were discussed -X-rays were obtained and reviewed with the patient.  No evidence of fracture or stress fracture noted. -Steroid injection performed the second interspace.  The skin was prepped with alcohol and mixture of 1 cc Kenalog 10, 0.5 cc lidocaine plain, 0.5 cc of Marcaine plain was infiltrated into the second interspace on the area tenderness without complications.  Postinjection care discussed.  Tolerated well. -Around does dispense.  Discussed modifications and different arch supports as needed as well.  If symptoms continue discussed custom inserts, dehydrated sclerosing injection.  Trula Slade DPM

## 2020-09-05 NOTE — Patient Instructions (Signed)
Morton Neuralgia  Morton neuralgia is foot pain that affects the ball of the foot and the area near the toes. Morton neuralgia occurs when part of a nerve in the foot (digital nerve) is under too much pressure (compressed). When this happens over a long period of time, the nerve can thicken (neuroma) and cause pain. Pain usually occurs between the third and fourth toes.  Morton neuralgia can come and go but may get worse over time. What are the causes? This condition is caused by doing the same things over and over with your foot, such as: Activities such as running or jumping. Wearing shoes that are too tight. What increases the risk? You may be at higher risk for Morton neuralgia if you: Are male. Wear high heels. Wear shoes that are narrow or tight. Do activities that repeatedly stretch your toes, such as: Running. Ballet. Long-distance walking. What are the signs or symptoms? The first symptom of Morton neuralgia is pain that spreads from the ball of the foot to the toes. It may feel like you are walking on a marble. Pain usually gets worse with walking and goes away at night. Other symptoms may include numbness and cramping of your toes. Both feet are equally affected, but rarelyat the same time. How is this diagnosed? This condition is diagnosed based on your symptoms, your medical history, and a physical exam. Your health care provider may: Squeeze your foot just behind your toe. Ask you to move your toes to check for pain. Ask about your physical activity level. You also may have imaging tests, such as an X-ray, ultrasound, or MRI. How is this treated? Treatment depends on how severe your condition is and what causes it. Treatment may involve: Wearing different shoes that are not too tight, are low-heeled, and provide good support. For some people, this is the only treatment needed. Wearing an over-the-counter or custom supportive pad (orthotic) under the front of your  foot. Getting injections of numbing medicine and anti-inflammatory medicine (steroid) in the nerve. Having surgery to remove part of the thickened nerve. Follow these instructions at home: Managing pain, stiffness, and swelling  Massage your foot as needed. Wear orthotics as told by your health care provider. If directed, put ice on your foot: Put ice in a plastic bag. Place a towel between your skin and the bag. Leave the ice on for 20 minutes, 2-3 times a day. Avoid activities that cause pain or make pain worse. If you play sports, ask your health care provider when it is safe for you to return to sports. Raise (elevate) your foot above the level of your heart while lying down and, when possible, while sitting.  General instructions Take over-the-counter and prescription medicines only as told by your health care provider. Do not drive or use heavy machinery while taking prescription pain medicine. Wear shoes that: Have soft soles. Have a wide toe area. Provide arch support. Do not pinch or squeeze your feet. Have room for your orthotics, if applicable. Keep all follow-up visits as told by your health care provider. This is important. Contact a health care provider if: Your symptoms get worse or do not get better with treatment and home care. Summary Morton neuralgia is foot pain that affects the ball of the foot and the area near the toes. Pain usually occurs between the third and fourth toes, gets worse with walking, and goes away at night. Morton neuralgia occurs when part of a nerve in the foot (digital nerve)  is under too much pressure. When this happens over a long period of time, the nerve can thicken (neuroma) and cause pain. This condition is caused by doing the same things over and over with your foot, such as running or jumping, wearing shoes that are too tight, or wearing high heels. Treatment may involve wearing low-heeled shoes that are not too tight, wearing a  supportive pad (orthotic) under the front of your foot, getting injections in the nerve, or having surgery to remove part of the thickened nerve. This information is not intended to replace advice given to you by your health care provider. Make sure you discuss any questions you have with your healthcare provider. Document Revised: 01/25/2017 Document Reviewed: 01/25/2017 Elsevier Patient Education  2022 [employer].

## 2020-09-18 ENCOUNTER — Other Ambulatory Visit: Payer: Self-pay | Admitting: Internal Medicine

## 2020-10-17 ENCOUNTER — Ambulatory Visit: Payer: BC Managed Care – PPO | Admitting: Podiatry

## 2020-10-24 ENCOUNTER — Ambulatory Visit (INDEPENDENT_AMBULATORY_CARE_PROVIDER_SITE_OTHER): Payer: BC Managed Care – PPO | Admitting: Podiatry

## 2020-10-24 ENCOUNTER — Encounter: Payer: Self-pay | Admitting: Podiatry

## 2020-10-24 ENCOUNTER — Other Ambulatory Visit: Payer: Self-pay

## 2020-10-24 DIAGNOSIS — M778 Other enthesopathies, not elsewhere classified: Secondary | ICD-10-CM

## 2020-10-24 DIAGNOSIS — D3610 Benign neoplasm of peripheral nerves and autonomic nervous system, unspecified: Secondary | ICD-10-CM

## 2020-10-24 NOTE — Progress Notes (Signed)
  Subjective:  Patient ID: Darrell Santos, male    DOB: [birth year],   MRN: [id number]  Chief Complaint  Patient presents with   Follow-up    Left second interspace neuroma      55 y.o. 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 Previously seen by Dr. Jacqualyn Posey and been treated with padding and injection. Patient still having pain in the same area and concerned.  . Denies any other pedal complaints. Denies n/v/f/c.   Past Medical History:  Diagnosis Date   Allergic rhinitis    Allergy    Anxiety    Asthma    Diabetes mellitus without complication (HCC)    GERD (gastroesophageal reflux disease)    HTN (hypertension)    Hyperlipidemia    borderline   Rosacea     Objective:  Physical Exam: Vascular: DP/PT pulses 2/4 bilateral. CFT <3 seconds. Normal hair growth on digits. No edema.  Skin. No lacerations or abrasions bilateral feet.  Musculoskeletal: MMT 5/5 bilateral lower extremities in DF, PF, Inversion and Eversion. Deceased ROM in DF of ankle joint. Tender to palpation between 2nd and 3rd metatarsals.  Neurological: Sensation intact to light touch.   Assessment:   1. Capsulitis of left foot   2. Neuroma      Plan:  Patient was evaluated and treated and all questions answered. Discussed neuroma and etiology with patient as well as capsulitis.  At this point discussed further treatment options and patient opted for another injection into interspace.  Dispensed additional padding to make sure padding in proper place.  Discussed if this does not improve we will get an MRI and determine if surgery may be necessary.  Patient to follow-up in 4-5 weeks or sooner if concerns arise.   Procedure: Injection Tendon/Ligament Discussed alternatives, risks, complications and verbal consent was obtained.  Location: Left second interspace . Skin Prep: Alcohol. Injectate: 1cc 0.5% marcaine plain, 1 cc dexamethasone. And 0.5 cc kenalog Disposition: Patient  tolerated procedure well. Injection site dressed with a band-aid.  Post-injection care was discussed and return precautions discussed.    Lorenda Peck, DPM

## 2020-11-20 ENCOUNTER — Other Ambulatory Visit: Payer: Self-pay

## 2020-11-20 ENCOUNTER — Ambulatory Visit: Payer: BC Managed Care – PPO | Admitting: Internal Medicine

## 2020-11-20 ENCOUNTER — Encounter: Payer: Self-pay | Admitting: Internal Medicine

## 2020-11-20 VITALS — BP 134/76 | HR 85 | Temp 98.1°F | Ht 72.0 in | Wt 204.4 lb

## 2020-11-20 DIAGNOSIS — Z0000 Encounter for general adult medical examination without abnormal findings: Secondary | ICD-10-CM

## 2020-11-20 DIAGNOSIS — Z789 Other specified health status: Secondary | ICD-10-CM

## 2020-11-20 DIAGNOSIS — E781 Pure hyperglyceridemia: Secondary | ICD-10-CM

## 2020-11-20 DIAGNOSIS — M25572 Pain in left ankle and joints of left foot: Secondary | ICD-10-CM

## 2020-11-20 DIAGNOSIS — E538 Deficiency of other specified B group vitamins: Secondary | ICD-10-CM

## 2020-11-20 DIAGNOSIS — E1165 Type 2 diabetes mellitus with hyperglycemia: Secondary | ICD-10-CM | POA: Diagnosis not present

## 2020-11-20 DIAGNOSIS — Z23 Encounter for immunization: Secondary | ICD-10-CM

## 2020-11-20 DIAGNOSIS — F1090 Alcohol use, unspecified, uncomplicated: Secondary | ICD-10-CM

## 2020-11-20 DIAGNOSIS — I10 Essential (primary) hypertension: Secondary | ICD-10-CM

## 2020-11-20 MED ORDER — B COMPLEX PLUS PO TABS: 1.0000 | ORAL_TABLET | Freq: Every day | ORAL | 3 refills | Status: AC

## 2020-11-20 NOTE — Assessment & Plan Note (Signed)
Take B complex

## 2020-11-20 NOTE — Progress Notes (Signed)
Subjective:  Patient ID: Darrell Santos, male    DOB: May 15, 1965  Age: 55 y.o. MRN: [id number]  CC: Follow-up   HPI Darrell Santos presents for anxiety, dyslipidemia, DM, L foot pain  Outpatient Medications Prior to Visit  Medication Sig Dispense Refill   albuterol (PROAIR HFA) 108 (90 Base) MCG/ACT inhaler Inhale 2 puffs into the lungs every 4 (four) hours as needed for wheezing or shortness of breath. 56 g 3   Blood Glucose Calibration (OT ULTRA/FASTTK CNTRL SOLN) SOLN 1 Bottle by Does not apply route as directed. 2 each 0   clonazePAM (KLONOPIN) 0.5 MG tablet TAKE 1 TABLET AT BEDTIME AS NEEDED 90 tablet 0   diazepam (VALIUM) 2 MG tablet Take one tablet tid for 7 days     escitalopram (LEXAPRO) 10 MG tablet TAKE 1 TABLET DAILY 90 tablet 3   esomeprazole (NEXIUM) 40 MG capsule Take by mouth.     fenofibrate 160 MG tablet Take 1 tablet (160 mg total) by mouth daily. Annual appt w/labs is due in must see provider for future refills 90 tablet 3   fluticasone (FLONASE) 50 MCG/ACT nasal spray Place 1 spray into both nostrils daily. 15.8 mL 0   glucose blood (ONETOUCH VERIO) test strip Use as instructed to test blood sugar 3 times daily E11.9 100 each 12   metFORMIN (GLUCOPHAGE) 500 MG tablet Take 1 tablet (500 mg total) by mouth daily with breakfast. 90 tablet 1   olmesartan-hydrochlorothiazide (BENICAR HCT) 20-12.5 MG tablet TAKE 1 TABLET DAILY 90 tablet 3   pantoprazole (PROTONIX) 40 MG tablet TAKE 1 TABLET DAILY 90 tablet 3   QVAR REDIHALER 80 MCG/ACT inhaler USE 1 INHALATION TWICE A DAY 31.8 g 5   rosuvastatin (CRESTOR) 5 MG tablet Take 1 tablet (5 mg total) by mouth daily. 90 tablet 1   ID NOW COVID-19 KIT TEST AS DIRECTED TODAY (Patient not taking: Reported on 11/20/2020)     0.9 %  sodium chloride infusion      No facility-administered medications prior to visit.    ROS: Review of Systems  Constitutional:  Negative for appetite change, fatigue and unexpected weight  change.  HENT:  Negative for congestion, nosebleeds, sneezing, sore throat and trouble swallowing.   Eyes:  Negative for itching and visual disturbance.  Respiratory:  Negative for cough.   Cardiovascular:  Negative for chest pain, palpitations and leg swelling.  Gastrointestinal:  Negative for abdominal distention, blood in stool, diarrhea and nausea.  Genitourinary:  Negative for frequency and hematuria.  Musculoskeletal:  Positive for arthralgias. Negative for back pain, gait problem, joint swelling and neck pain.  Skin:  Negative for rash.  Neurological:  Negative for dizziness, tremors, speech difficulty and weakness.  Psychiatric/Behavioral:  Negative for agitation, dysphoric mood, sleep disturbance and suicidal ideas. The patient is not nervous/anxious.    Objective:  BP 134/76 (BP Location: Left Arm)   Pulse 85   Temp 98.1 F (36.7 C) (Oral)   Ht 6' (1.829 m)   Wt 204 lb 6.4 oz (92.7 kg)   SpO2 98%   BMI 27.72 kg/m   BP Readings from Last 3 Encounters:  11/20/20 134/76  08/12/20 138/86  04/08/20 136/86    Wt Readings from Last 3 Encounters:  11/20/20 204 lb 6.4 oz (92.7 kg)  08/12/20 207 lb (93.9 kg)  04/08/20 211 lb (95.7 kg)    Physical Exam Constitutional:      General: He is not in acute distress.  Appearance: He is well-developed.     Comments: NAD  Eyes:     Conjunctiva/sclera: Conjunctivae normal.     Pupils: Pupils are equal, round, and reactive to light.  Neck:     Thyroid: No thyromegaly.     Vascular: No JVD.  Cardiovascular:     Rate and Rhythm: Normal rate and regular rhythm.     Heart sounds: Normal heart sounds. No murmur heard.   No friction rub. No gallop.  Pulmonary:     Effort: Pulmonary effort is normal. No respiratory distress.     Breath sounds: Normal breath sounds. No wheezing or rales.  Chest:     Chest wall: No tenderness.  Abdominal:     General: Bowel sounds are normal. There is no distension.     Palpations: Abdomen is  soft. There is no mass.     Tenderness: There is no abdominal tenderness. There is no guarding or rebound.  Musculoskeletal:        General: No tenderness. Normal range of motion.     Cervical back: Normal range of motion.  Lymphadenopathy:     Cervical: No cervical adenopathy.  Skin:    General: Skin is warm and dry.     Findings: No rash.  Neurological:     Mental Status: He is alert and oriented to person, place, and time.     Cranial Nerves: No cranial nerve deficit.     Motor: No abnormal muscle tone.     Coordination: Coordination normal.     Gait: Gait normal.     Deep Tendon Reflexes: Reflexes are normal and symmetric.  Psychiatric:        Behavior: Behavior normal.        Thought Content: Thought content normal.        Judgment: Judgment normal.  Both feet are nontender at the moment  Lab Results  Component Value Date   WBC 9.1 01/14/2020   HGB 15.2 01/14/2020   HCT 43.8 01/14/2020   PLT 177.0 01/14/2020   GLUCOSE 71 08/12/2020   CHOL 208 (H) 08/12/2020   TRIG 215.0 (H) 08/12/2020   HDL 53.20 08/12/2020   LDLDIRECT 142.0 08/12/2020   LDLCALC 124 (H) 08/14/2019   ALT 38 08/12/2020   AST 29 08/12/2020   NA 140 08/12/2020   K 4.0 08/12/2020   CL 104 08/12/2020   CREATININE 0.91 08/12/2020   BUN 10 08/12/2020   CO2 28 08/12/2020   TSH 1.68 08/12/2020   PSA 0.9 08/14/2019   HGBA1C 5.5 08/12/2020   MICROALBUR <0.7 08/12/2020    CT Abdomen Pelvis W Contrast  Result Date: 01/15/2020 CLINICAL DATA:  Epigastric pain with nausea and diarrhea. EXAM: CT ABDOMEN AND PELVIS WITH CONTRAST TECHNIQUE: Multidetector CT imaging of the abdomen and pelvis was performed using the standard protocol following bolus administration of intravenous contrast. CONTRAST:  122m OMNIPAQUE IOHEXOL 300 MG/ML  SOLN COMPARISON:  None. FINDINGS: Lower chest: Unremarkable. Hepatobiliary: 9 mm hypodensity posterior right liver is too small to characterize but likely benign. Gallbladder is  surgically absent. No intrahepatic or extrahepatic biliary dilation. Pancreas: No focal mass lesion. No dilatation of the main duct. No intraparenchymal cyst. No peripancreatic edema. Spleen: No splenomegaly. No focal mass lesion. Adrenals/Urinary Tract: No adrenal nodule or mass. Right kidney unremarkable. 8 mm hypodensity in the interpolar left kidney is too small to characterize but likely benign. No evidence for hydroureter. Distal ureters and urinary bladder are obscured from beam hardening artifact due to bilateral  hip replacement. Stomach/Bowel: Stomach is unremarkable. No gastric wall thickening. No evidence of outlet obstruction. Duodenum is normally positioned as is the ligament of Treitz. No small bowel wall thickening. No small bowel dilatation. The terminal ileum is normal. The appendix is normal. No gross colonic mass. No colonic wall thickening. Vascular/Lymphatic: There is abdominal aortic atherosclerosis without aneurysm. There is no gastrohepatic or hepatoduodenal ligament lymphadenopathy. No retroperitoneal or mesenteric lymphadenopathy. Upper normal lymph nodes are seen in the central small bowel mesentery, nonspecific but likely reactive. No pelvic lymphadenopathy evident although portions of the inferior pelvic sidewalls are obscured bilaterally. Reproductive: Prostate gland obscured by streak beam hardening artifact. Other: No intraperitoneal free fluid. Musculoskeletal: Status post bilateral hip replacement. No worrisome lytic or sclerotic osseous abnormality. IMPRESSION: 1. No acute findings in the abdomen or pelvis. Specifically, no findings to explain the patient's history of epigastric pain and nausea. 2. Aortic Atherosclerosis (ICD10-I70.0). Electronically Signed   By: Misty Stanley M.D.   On: 01/15/2020 14:48    Assessment & Plan:   Problem List Items Addressed This Visit     Alcohol use    Reduce Crestor to QOD      Essential hypertension    2021 CT Coronary calcium score  of 0 On fenofibrate and Crestor      Hypertriglyceridemia    2021 CT Coronary calcium score of 0 On fenofibrate       Low serum vitamin B12    Take B complex      Pain in joint involving ankle and foot    F/u w/Dr Blenda Mounts  L foot capsulitis      Uncontrolled type 2 diabetes mellitus with hyperglycemia ([place])    On Metformin 2021 CT Coronary calcium score of 0      Relevant Orders   Hemoglobin A1c   Well adult exam - Primary   Relevant Orders   TSH   Urinalysis   CBC with Differential/Platelet   Lipid panel   PSA   Comprehensive metabolic panel      Meds ordered this encounter  Medications   B Complex-Folic Acid (B COMPLEX PLUS) TABS    Sig: Take 1 tablet by mouth daily.    Dispense:  100 tablet    Refill:  3       Follow-up: Return in about 6 months (around 05/21/2021) for Wellness Exam.  Walker Kehr, MD

## 2020-11-20 NOTE — Assessment & Plan Note (Signed)
F/u w/Dr Blenda Mounts  L foot capsulitis

## 2020-11-20 NOTE — Assessment & Plan Note (Signed)
2021 CT Coronary calcium score of 0 On fenofibrate and Crestor

## 2020-11-20 NOTE — Patient Instructions (Signed)
Hoka bondi EE

## 2020-11-20 NOTE — Assessment & Plan Note (Signed)
Reduce Crestor to QOD

## 2020-11-20 NOTE — Assessment & Plan Note (Signed)
2021 CT Coronary calcium score of 0 On fenofibrate

## 2020-11-20 NOTE — Assessment & Plan Note (Addendum)
On Metformin 2021 CT Coronary calcium score of 0

## 2020-11-21 ENCOUNTER — Ambulatory Visit: Payer: BC Managed Care – PPO | Admitting: Podiatry

## 2020-11-21 ENCOUNTER — Encounter: Payer: Self-pay | Admitting: Podiatry

## 2020-11-21 DIAGNOSIS — M778 Other enthesopathies, not elsewhere classified: Secondary | ICD-10-CM

## 2020-11-21 DIAGNOSIS — D3610 Benign neoplasm of peripheral nerves and autonomic nervous system, unspecified: Secondary | ICD-10-CM | POA: Diagnosis not present

## 2020-11-21 NOTE — Progress Notes (Signed)
  Subjective:  Patient ID: Darrell Santos, male    DOB: December 29, 1965,   MRN: [id number]  Chief Complaint  Patient presents with   Foot Pain    I am still having pain in my foot and the injection did not help    55 y.o. 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 Patient relates last injection did not help at all. Has continued to have pain after about 30 minutes of walking.  . Denies any other pedal complaints. Denies n/v/f/c.   Past Medical History:  Diagnosis Date   Allergic rhinitis    Allergy    Anxiety    Asthma    Diabetes mellitus without complication (HCC)    GERD (gastroesophageal reflux disease)    HTN (hypertension)    Hyperlipidemia    borderline   Rosacea     Objective:  Physical Exam: Vascular: DP/PT pulses 2/4 bilateral. CFT <3 seconds. Normal hair growth on digits. No edema.  Skin. No lacerations or abrasions bilateral feet.  Musculoskeletal: MMT 5/5 bilateral lower extremities in DF, PF, Inversion and Eversion. Deceased ROM in DF of ankle joint. Tender to palpation between 2nd and 3rd metatarsals.  Neurological: Sensation intact to light touch.   Assessment:   1. Neuroma   2. Capsulitis of left foot      Plan:  Patient was evaluated and treated and all questions answered. Discussed neuroma and etiology with patient as well as capsulitis.  At this point discussed further treatment options and patient opted for  further imaging for possible surgery  MRI ordered.   Discussed supportive shoes and recommendations given.  Patient to follow-up after MRI      Lorenda Peck, DPM

## 2020-12-15 ENCOUNTER — Encounter: Payer: Self-pay | Admitting: Internal Medicine

## 2020-12-16 MED ORDER — OLMESARTAN MEDOXOMIL-HCTZ 20-12.5 MG PO TABS: 1.0000 | ORAL_TABLET | Freq: Every day | ORAL | 3 refills | Status: DC

## 2020-12-18 DIAGNOSIS — R001 Bradycardia, unspecified: Secondary | ICD-10-CM | POA: Diagnosis not present

## 2020-12-18 DIAGNOSIS — R9431 Abnormal electrocardiogram [ECG] [EKG]: Secondary | ICD-10-CM | POA: Diagnosis not present

## 2020-12-18 DIAGNOSIS — R4020 Unspecified coma: Secondary | ICD-10-CM | POA: Diagnosis not present

## 2020-12-18 DIAGNOSIS — R609 Edema, unspecified: Secondary | ICD-10-CM | POA: Diagnosis not present

## 2020-12-18 DIAGNOSIS — I959 Hypotension, unspecified: Secondary | ICD-10-CM | POA: Diagnosis not present

## 2020-12-18 DIAGNOSIS — T781XXA Other adverse food reactions, not elsewhere classified, initial encounter: Secondary | ICD-10-CM | POA: Diagnosis not present

## 2020-12-18 DIAGNOSIS — R1319 Other dysphagia: Secondary | ICD-10-CM | POA: Diagnosis not present

## 2020-12-18 DIAGNOSIS — I469 Cardiac arrest, cause unspecified: Secondary | ICD-10-CM | POA: Diagnosis not present

## 2020-12-18 DIAGNOSIS — X58XXXA Exposure to other specified factors, initial encounter: Secondary | ICD-10-CM | POA: Diagnosis not present

## 2020-12-18 DIAGNOSIS — T7840XA Allergy, unspecified, initial encounter: Secondary | ICD-10-CM | POA: Diagnosis not present

## 2020-12-18 DIAGNOSIS — T7801XA Anaphylactic reaction due to peanuts, initial encounter: Secondary | ICD-10-CM | POA: Diagnosis not present

## 2020-12-18 DIAGNOSIS — T782XXA Anaphylactic shock, unspecified, initial encounter: Secondary | ICD-10-CM | POA: Diagnosis not present

## 2020-12-20 DIAGNOSIS — R9431 Abnormal electrocardiogram [ECG] [EKG]: Secondary | ICD-10-CM | POA: Diagnosis not present

## 2020-12-24 ENCOUNTER — Telehealth: Payer: Self-pay | Admitting: Internal Medicine

## 2020-12-24 NOTE — Telephone Encounter (Signed)
Patient calling in  Original rx for olmesartan-hydrochlorothiazide (BENICAR HCT) 20-12.5 MG tablet was sent to [facility]  Patient says med is currently on backorder w/ [facility] & he has been waiting since 11/22  Wants to know if provider can send in short supply to local pharmacy for pickup  [id number] - [place], [address] Phone:  [phone]  Fax:  [phone]

## 2020-12-25 MED ORDER — OLMESARTAN MEDOXOMIL-HCTZ 20-12.5 MG PO TABS: 1.0000 | ORAL_TABLET | Freq: Every day | ORAL | 3 refills | Status: DC

## 2020-12-25 NOTE — Telephone Encounter (Signed)
Notified pt rx sent to [facility]  

## 2021-02-08 ENCOUNTER — Other Ambulatory Visit: Payer: Self-pay | Admitting: Internal Medicine

## 2021-02-10 ENCOUNTER — Encounter: Payer: Self-pay | Admitting: Internal Medicine

## 2021-02-10 ENCOUNTER — Ambulatory Visit: Payer: BC Managed Care – PPO | Admitting: Internal Medicine

## 2021-02-10 VITALS — BP 144/80 | HR 78 | Ht 72.0 in | Wt 209.0 lb

## 2021-02-10 DIAGNOSIS — E119 Type 2 diabetes mellitus without complications: Secondary | ICD-10-CM | POA: Diagnosis not present

## 2021-02-10 DIAGNOSIS — R7989 Other specified abnormal findings of blood chemistry: Secondary | ICD-10-CM

## 2021-02-10 DIAGNOSIS — E785 Hyperlipidemia, unspecified: Secondary | ICD-10-CM | POA: Diagnosis not present

## 2021-02-10 LAB — POCT GLYCOSYLATED HEMOGLOBIN (HGB A1C): Hemoglobin A1C: 5.8 % — AB (ref 4.0–5.6)

## 2021-02-10 NOTE — Patient Instructions (Addendum)
Continue Metformin 500 mg every other day  Continue fenfobrate 160 mg daily  Rosuvastatin 5 mg every other day for now , I will message you through the portal if this needs to be changed    Start Vitamin 12 1000 mcg daily

## 2021-02-10 NOTE — Progress Notes (Signed)
Name: Darrell Santos  Age/ Sex: 56 y.o., male   MRN/ DOB: [id number], [birth year]     PCP: Cassandria Anger, MD   Reason for Endocrinology Evaluation: Type 2 Diabetes Mellitus  Initial Endocrine Consultative Visit: 03/07/2019    PATIENT IDENTIFIER: Darrell Santos is a 56 y.o. male with a past medical history of HTN and T2DM. The patient has followed with Endocrinology clinic since 03/07/2019 for consultative assistance with management of his diabetes.       DIABETIC HISTORY:  Darrell Santos was diagnosed with T2DM in 01/2019, he presented to the ED with symptomatic hyperglycemia . He was discharged on Metformin. He was on Repaglinide for a short period of time but was discontinued in 02/2019 due to hypoglycemia.His hemoglobin A1c has ranged from 5.6% in 05/2019, peaking at 9.4% in 02/2019.   Rosuvastatin started 07/2020  SUBJECTIVE:   During the last visit (08/12/2020): A1c 5.5 % , we continued Metformin         Today (02/10/2021): Darrell Santos is here for a follow up on diabetes management.  He checks his blood sugars 1 times weekly. The patient has not had hypoglycemic episodes since the last clinic visit.   Had an ED visit in 11/2020 for an allergic reaction to food , received solumedrol . 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   3rd toe tingling is stable - saw podiatry , pending   Denies nausea, vomiting or diarrhea      HOME ENDOCRINE  REGIMEN:  Metformin 500 mg  1 tab every other day  Rosuvastatin 5 mg every other day  Fenofibrate 160 mg daily     Statin:yes  ACE-I/ARB: yes    METER DOWNLOAD SUMMARY:Unable to download  [id number]     DIABETIC COMPLICATIONS: Microvascular complications:    Denies: CKD, retinopathy, neuropathy Last eye exam: Completed 05/30/2019   Macrovascular complications:    Denies: CAD, PVD, CVA    HISTORY:  Past Medical History:  Past Medical History:  Diagnosis Date   Allergic rhinitis    Allergy    Anxiety    Asthma     Diabetes mellitus without complication (HCC)    GERD (gastroesophageal reflux disease)    HTN (hypertension)    Hyperlipidemia    borderline   Rosacea    Past Surgical History:  Past Surgical History:  Procedure Laterality Date   CHOLECYSTECTOMY     COLONOSCOPY     NASAL SINUS SURGERY     TOTAL HIP ARTHROPLASTY     Social History:  reports that he has never smoked. He quit smokeless tobacco use about 23 years ago. He reports current alcohol use of about 30.0 standard drinks per week. He reports that he does not use drugs. Family History:  Family History  Problem Relation Age of Onset   Hypertension Other    Hypertension Other    Diabetes Other        1st degree relative    Asthma Mother    Hypertension Father    Diabetes Father    Colon polyps Neg Hx    Colon cancer Neg Hx    Esophageal cancer Neg Hx    Rectal cancer Neg Hx    Stomach cancer Neg Hx      HOME MEDICATIONS: Allergies as of 02/10/2021       Reactions   Midazolam Shortness Of Breath   (Pre-op)- "dificulty breathing with associated heaviness in chest"    Molds & Smuts  Shortness Of Breath   Other Anaphylaxis   Cats and nuts   Pollen Extract         Medication List        Accurate as of February 10, 2021 12:34 PM. If you have any questions, ask your nurse or doctor.          albuterol 108 (90 Base) MCG/ACT inhaler Commonly known as: ProAir HFA Inhale 2 puffs into the lungs every 4 (four) hours as needed for wheezing or shortness of breath.   B Complex Plus Tabs Take 1 tablet by mouth daily.   clonazePAM 0.5 MG tablet Commonly known as: KLONOPIN TAKE 1 TABLET AT BEDTIME AS NEEDED   diazepam 2 MG tablet Commonly known as: VALIUM Take one tablet tid for 7 days   escitalopram 10 MG tablet Commonly known as: LEXAPRO TAKE 1 TABLET DAILY   esomeprazole 40 MG capsule Commonly known as: NEXIUM Take by mouth.   fenofibrate 160 MG tablet Take 1 tablet (160 mg total) by mouth daily.  Annual appt w/labs is due in must see provider for future refills   fluticasone 50 MCG/ACT nasal spray Commonly known as: FLONASE Place 1 spray into both nostrils daily.   metFORMIN 500 MG tablet Commonly known as: GLUCOPHAGE TAKE 1 TABLET DAILY WITH BREAKFAST (NOTE DECREASE IN DOSING)   olmesartan-hydrochlorothiazide 20-12.5 MG tablet Commonly known as: BENICAR HCT Take 1 tablet by mouth daily.   OneTouch Verio test strip Generic drug: glucose blood Use as instructed to test blood sugar 3 times daily E11.9   OT ULTRA/FASTTK CNTRL SOLN Soln 1 Bottle by Does not apply route as directed.   pantoprazole 40 MG tablet Commonly known as: PROTONIX TAKE 1 TABLET DAILY   Qvar RediHaler 80 MCG/ACT inhaler Generic drug: beclomethasone USE 1 INHALATION TWICE A DAY   rosuvastatin 5 MG tablet Commonly known as: Crestor Take 1 tablet (5 mg total) by mouth daily.         OBJECTIVE:   Vital Signs: There were no vitals taken for this visit.  Wt Readings from Last 3 Encounters:  11/20/20 204 lb 6.4 oz (92.7 kg)  08/12/20 207 lb (93.9 kg)  04/08/20 211 lb (95.7 kg)     Exam: General: Pt appears well and is in NAD  Lungs: Clear to auscultation    Heart: RRR with normal S1 and S2 and no gallops; no murmurs; no rub  Abdomen: Normoactive bowel sounds, soft, nontender, without masses or organomegaly palpable  Extremities: No pretibial edema.  Skin: Normal texture and temperature to palpation.  Neuro: MS is good with appropriate affect, pt is alert and Ox3        DM foot exam: 04/08/2020   The skin of the feet is intact without sores or ulcerations, pt with callous formation at the medial aspect of the 1st MT joint The pedal pulses are 2+ on right and 2+ on left. The sensation is intact to a screening 5.07, 10 gram monofilament bilaterally       DATA REVIEWED:  Lab Results  Component Value Date   HGBA1C 5.5 08/12/2020   HGBA1C 5.6 04/08/2020   HGBA1C 5.0 10/12/2019    Results for TRACKER, MANCE (MRN [id number]) as of 08/14/2020 07:06  Ref. Range 08/12/2020 15:54  Sodium Latest Ref Range: 135 - 145 mEq/L 140  Potassium Latest Ref Range: 3.5 - 5.1 mEq/L 4.0  Chloride Latest Ref Range: 96 - 112 mEq/L 104  CO2 Latest Ref Range: 19 - 32 mEq/L  28  Glucose Latest Ref Range: 70 - 99 mg/dL 71  BUN Latest Ref Range: 6 - 23 mg/dL 10  Creatinine Latest Ref Range: 0.40 - 1.50 mg/dL 0.91  Calcium Latest Ref Range: 8.4 - 10.5 mg/dL 9.8  Alkaline Phosphatase Latest Ref Range: 39 - 117 U/L 35 (L)  Albumin Latest Ref Range: 3.5 - 5.2 g/dL 4.7  AST Latest Ref Range: 0 - 37 U/L 29  ALT Latest Ref Range: 0 - 53 U/L 38  Total Protein Latest Ref Range: 6.0 - 8.3 g/dL 7.2  Total Bilirubin Latest Ref Range: 0.2 - 1.2 mg/dL 0.6  GFR Latest Ref Range: >60.00 mL/min 94.79  Total CHOL/HDL Ratio Unknown 4  Cholesterol Latest Ref Range: 0 - 200 mg/dL 208 (H)  HDL Cholesterol Latest Ref Range: >39.00 mg/dL 53.20  Direct LDL Latest Units: mg/dL 142.0  MICROALB/CREAT RATIO Latest Ref Range: 0.0 - 30.0 mg/g 0.7  NonHDL Unknown 154.47  Triglycerides Latest Ref Range: 0.0 - 149.0 mg/dL 215.0 (H)  VLDL Latest Ref Range: 0.0 - 40.0 mg/dL 43.0 (H)  Vitamin B12 Latest Ref Range: 211 - 911 pg/mL 277  TSH Latest Ref Range: 0.35 - 5.50 uIU/mL 1.68    ASSESSMENT / PLAN / RECOMMENDATIONS:   1) Type 2 Diabetes Mellitus, Optimally controlled, Without complications - Most recent A1c of 5.5 %. Goal A1c < 7.0%.    - He continues to do very well with glycemic control,he attempted to stop it but his BG's increased to 150's and he went back on it.  - He is not having any side effects and will continue    MEDICATIONS: Metformin 500 mg , 1 tablet with Breakfast   EDUCATION / INSTRUCTIONS: BG monitoring instructions: Patient is instructed to check his blood sugars 3 times a week . Call [facility] if: BG persistently < 70  I reviewed the Rule of 15 for the treatment of  hypoglycemia in detail with the patient. Literature supplied.     2) Diabetic complications:  Eye: Does not have known diabetic retinopathy. Up to date  Neuro/ Feet: Does not have known diabetic peripheral neuropathy. Renal: Patient does not have known baseline CKD. He is on an ACEI/ARB at present.     3) Dyslipidemia:     - Tg has normalized and LDL at goal , but his LFT's have increased   Medication  Continue  Rosuvastatin 5 mg every other day  Continue Fenofibrate 160 mg daily   4) Elevated LFT's:   - Slight elevation of AST and ALT , due to statin therapy. Spoke to him over the phone on 02/11/2021 and he is comfortable with continuing the rosuvastatin every other day with monitoring of LFT's.  - We also discussed other options but will stay on this for now     F/U in 4 months     Signed electronically by: Mack Guise, MD  [facility] Endocrinology  [facility] [address] [place], [place] [address] Phone: [phone] FAX: [phone]   CC: Cassandria Anger, MD [place] [address] Phone: [phone]  Fax: [phone]  Return to Endocrinology clinic as below: Future Appointments  Date Time Provider [place]  02/10/2021  3:00 PM Andres Escandon, Melanie Crazier, MD [facility]  05/21/2021  3:00 PM Plotnikov, Evie Lacks, MD [facility] None

## 2021-02-11 DIAGNOSIS — R7989 Other specified abnormal findings of blood chemistry: Secondary | ICD-10-CM | POA: Insufficient documentation

## 2021-02-11 LAB — COMPREHENSIVE METABOLIC PANEL
ALT: 54 U/L — ABNORMAL HIGH (ref 0–53)
AST: 47 U/L — ABNORMAL HIGH (ref 0–37)
Albumin: 4.7 g/dL (ref 3.5–5.2)
Alkaline Phosphatase: 40 U/L (ref 39–117)
BUN: 11 mg/dL (ref 6–23)
CO2: 27 mEq/L (ref 19–32)
Calcium: 9.3 mg/dL (ref 8.4–10.5)
Chloride: 103 mEq/L (ref 96–112)
Creatinine, Ser: 1 mg/dL (ref 0.40–1.50)
GFR: 84.35 mL/min (ref >60.00)
Glucose, Bld: 95 mg/dL (ref 70–99)
Potassium: 3.9 mEq/L (ref 3.5–5.1)
Sodium: 140 mEq/L (ref 135–145)
Total Bilirubin: 0.6 mg/dL (ref 0.2–1.2)
Total Protein: 7.6 g/dL (ref 6.0–8.3)

## 2021-02-11 LAB — LIPID PANEL
Cholesterol: 174 mg/dL (ref 0–200)
HDL: 59.3 mg/dL (ref >39.00)
LDL Cholesterol: 88 mg/dL (ref 0–99)
NonHDL: 114.59
Total CHOL/HDL Ratio: 3
Triglycerides: 131 mg/dL (ref 0.0–149.0)
VLDL: 26.2 mg/dL (ref 0.0–40.0)

## 2021-03-23 ENCOUNTER — Other Ambulatory Visit: Payer: Self-pay | Admitting: Internal Medicine

## 2021-05-20 DIAGNOSIS — T781XXA Other adverse food reactions, not elsewhere classified, initial encounter: Secondary | ICD-10-CM | POA: Diagnosis not present

## 2021-05-20 DIAGNOSIS — X58XXXA Exposure to other specified factors, initial encounter: Secondary | ICD-10-CM | POA: Diagnosis not present

## 2021-05-20 DIAGNOSIS — T782XXA Anaphylactic shock, unspecified, initial encounter: Secondary | ICD-10-CM | POA: Diagnosis not present

## 2021-05-21 ENCOUNTER — Other Ambulatory Visit (HOSPITAL_BASED_OUTPATIENT_CLINIC_OR_DEPARTMENT_OTHER): Payer: Self-pay

## 2021-05-21 ENCOUNTER — Encounter: Payer: Self-pay | Admitting: Internal Medicine

## 2021-05-21 ENCOUNTER — Ambulatory Visit (INDEPENDENT_AMBULATORY_CARE_PROVIDER_SITE_OTHER): Payer: BC Managed Care – PPO | Admitting: Internal Medicine

## 2021-05-21 DIAGNOSIS — E669 Obesity, unspecified: Secondary | ICD-10-CM

## 2021-05-21 DIAGNOSIS — E1165 Type 2 diabetes mellitus with hyperglycemia: Secondary | ICD-10-CM

## 2021-05-21 DIAGNOSIS — E785 Hyperlipidemia, unspecified: Secondary | ICD-10-CM

## 2021-05-21 DIAGNOSIS — J4520 Mild intermittent asthma, uncomplicated: Secondary | ICD-10-CM | POA: Diagnosis not present

## 2021-05-21 DIAGNOSIS — M2550 Pain in unspecified joint: Secondary | ICD-10-CM | POA: Diagnosis not present

## 2021-05-21 DIAGNOSIS — Z91018 Allergy to other foods: Secondary | ICD-10-CM

## 2021-05-21 DIAGNOSIS — E1169 Type 2 diabetes mellitus with other specified complication: Secondary | ICD-10-CM

## 2021-05-21 MED ORDER — PANTOPRAZOLE SODIUM 40 MG PO TBEC
40.0000 mg | DELAYED_RELEASE_TABLET | Freq: Every day | ORAL | 3 refills | Status: DC
  Filled 2021-05-21: qty 90, 90d supply, fill #0

## 2021-05-21 MED ORDER — CLONAZEPAM 0.5 MG PO TABS
0.5000 mg | ORAL_TABLET | Freq: Every day | ORAL | 1 refills | Status: DC | PRN
  Filled 2021-05-21: qty 90, 90d supply, fill #0

## 2021-05-21 MED ORDER — ESCITALOPRAM OXALATE 10 MG PO TABS
10.0000 mg | ORAL_TABLET | Freq: Every day | ORAL | 3 refills | Status: DC
  Filled 2021-05-21: qty 90, 90d supply, fill #0

## 2021-05-21 MED ORDER — PSEUDOEPHEDRINE HCL 30 MG PO TABS
60.0000 mg | ORAL_TABLET | ORAL | 0 refills | Status: DC | PRN
  Filled 2021-05-21: qty 48, 4d supply, fill #0

## 2021-05-21 MED ORDER — PREDNISONE 20 MG PO TABS
40.0000 mg | ORAL_TABLET | Freq: Every day | ORAL | 3 refills | Status: DC | PRN
  Filled 2021-05-21: qty 20, 10d supply, fill #0

## 2021-05-21 MED ORDER — FENOFIBRATE 160 MG PO TABS
160.0000 mg | ORAL_TABLET | Freq: Every day | ORAL | 3 refills | Status: DC
  Filled 2021-05-21: qty 90, 90d supply, fill #0

## 2021-05-21 MED ORDER — DIPHENHYDRAMINE HCL 25 MG PO TABS
50.0000 mg | ORAL_TABLET | ORAL | 0 refills | Status: DC | PRN
  Filled 2021-05-21: qty 100, 9d supply, fill #0

## 2021-05-21 NOTE — Patient Instructions (Signed)
Blue-Emu cream -- use 2-3 times a day ? ?

## 2021-05-21 NOTE — Assessment & Plan Note (Signed)
Blue-Emu cream was recommended to use 2-3 times a day ? ?

## 2021-05-21 NOTE — Progress Notes (Signed)
? ?Subjective:  ?Patient ID: Darrell Santos, male    DOB: October 21, 1965  Age: 56 y.o. MRN: [id number] ? ?CC: Follow-up (6 month follow up) ? ? ?HPI ?Darrell Santos presents for anxiety, tree nut allergy, dyslipidemia, asthma ? ?Outpatient Medications Prior to Visit  ?Medication Sig Dispense Refill  ? albuterol (PROAIR HFA) 108 (90 Base) MCG/ACT inhaler Inhale 2 puffs into the lungs every 4 (four) hours as needed for wheezing or shortness of breath. 56 g 3  ? B Complex-Folic Acid (B COMPLEX PLUS) TABS Take 1 tablet by mouth daily. 100 tablet 3  ? Blood Glucose Calibration (OT ULTRA/FASTTK CNTRL SOLN) SOLN 1 Bottle by Does not apply route as directed. 2 each 0  ? diazepam (VALIUM) 2 MG tablet Take one tablet tid for 7 days    ? esomeprazole (NEXIUM) 40 MG capsule Take by mouth.    ? fluticasone (FLONASE) 50 MCG/ACT nasal spray Place 1 spray into both nostrils daily. 15.8 mL 0  ? glucose blood (ONETOUCH VERIO) test strip Use as instructed to test blood sugar 3 times daily E11.9 100 each 12  ? metFORMIN (GLUCOPHAGE) 500 MG tablet TAKE 1 TABLET DAILY WITH BREAKFAST (NOTE DECREASE IN DOSING) 90 tablet 0  ? olmesartan-hydrochlorothiazide (BENICAR HCT) 20-12.5 MG tablet Take 1 tablet by mouth daily. 30 tablet 3  ? QVAR REDIHALER 80 MCG/ACT inhaler USE 1 INHALATION TWICE A DAY 31.8 g 5  ? rosuvastatin (CRESTOR) 5 MG tablet Take 1 tablet (5 mg total) by mouth daily. 90 tablet 1  ? clonazePAM (KLONOPIN) 0.5 MG tablet TAKE 1 TABLET AT BEDTIME AS NEEDED 90 tablet 0  ? escitalopram (LEXAPRO) 10 MG tablet TAKE 1 TABLET DAILY 90 tablet 3  ? fenofibrate 160 MG tablet Take 1 tablet (160 mg total) by mouth daily. Annual appt w/labs is due in must see provider for future refills 90 tablet 3  ? pantoprazole (PROTONIX) 40 MG tablet TAKE 1 TABLET DAILY 90 tablet 3  ? ?No facility-administered medications prior to visit.  ? ? ?ROS: ?Review of Systems  ?Constitutional:  Negative for appetite change, fatigue and unexpected weight change.   ?HENT:  Negative for congestion, nosebleeds, sneezing, sore throat and trouble swallowing.   ?Eyes:  Negative for itching and visual disturbance.  ?Respiratory:  Negative for cough.   ?Cardiovascular:  Negative for chest pain, palpitations and leg swelling.  ?Gastrointestinal:  Negative for abdominal distention, blood in stool, diarrhea and nausea.  ?Genitourinary:  Negative for frequency and hematuria.  ?Musculoskeletal:  Negative for back pain, gait problem, joint swelling and neck pain.  ?Skin:  Negative for rash.  ?Neurological:  Negative for dizziness, tremors, speech difficulty and weakness.  ?Psychiatric/Behavioral:  Negative for agitation, dysphoric mood, sleep disturbance and suicidal ideas. The patient is nervous/anxious.   ? ?Objective:  ?BP 130/78 (BP Location: Left Arm, Patient Position: Sitting, Cuff Size: Large)   Pulse 91   Temp 98.2 ?F (36.8 ?C) (Oral)   Ht 6' (1.829 m)   Wt 206 lb (93.4 kg)   SpO2 97%   BMI 27.94 kg/m?  ? ?BP Readings from Last 3 Encounters:  ?05/21/21 130/78  ?02/10/21 (!) 144/80  ?11/20/20 134/76  ? ? ?Wt Readings from Last 3 Encounters:  ?05/21/21 206 lb (93.4 kg)  ?02/10/21 209 lb (94.8 kg)  ?11/20/20 204 lb 6.4 oz (92.7 kg)  ? ? ?Physical Exam ?Constitutional:   ?   General: He is not in acute distress. ?   Appearance: Normal appearance. He is  well-developed.  ?   Comments: NAD  ?Eyes:  ?   Conjunctiva/sclera: Conjunctivae normal.  ?   Pupils: Pupils are equal, round, and reactive to light.  ?Neck:  ?   Thyroid: No thyromegaly.  ?   Vascular: No JVD.  ?Cardiovascular:  ?   Rate and Rhythm: Normal rate and regular rhythm.  ?   Heart sounds: Normal heart sounds. No murmur heard. ?  No friction rub. No gallop.  ?Pulmonary:  ?   Effort: Pulmonary effort is normal. No respiratory distress.  ?   Breath sounds: Normal breath sounds. No wheezing or rales.  ?Chest:  ?   Chest wall: No tenderness.  ?Abdominal:  ?   General: Bowel sounds are normal. There is no distension.  ?    Palpations: Abdomen is soft. There is no mass.  ?   Tenderness: There is no abdominal tenderness. There is no guarding or rebound.  ?Musculoskeletal:     ?   General: No tenderness. Normal range of motion.  ?   Cervical back: Normal range of motion.  ?Lymphadenopathy:  ?   Cervical: No cervical adenopathy.  ?Skin: ?   General: Skin is warm and dry.  ?   Findings: No rash.  ?Neurological:  ?   Mental Status: He is alert and oriented to person, place, and time.  ?   Cranial Nerves: No cranial nerve deficit.  ?   Motor: No abnormal muscle tone.  ?   Coordination: Coordination normal.  ?   Gait: Gait normal.  ?   Deep Tendon Reflexes: Reflexes are normal and symmetric.  ?Psychiatric:     ?   Behavior: Behavior normal.     ?   Thought Content: Thought content normal.     ?   Judgment: Judgment normal.  ? ? ?Lab Results  ?Component Value Date  ? WBC 9.1 01/14/2020  ? HGB 15.2 01/14/2020  ? HCT 43.8 01/14/2020  ? PLT 177.0 01/14/2020  ? GLUCOSE 95 02/10/2021  ? CHOL 174 02/10/2021  ? TRIG 131.0 02/10/2021  ? HDL 59.30 02/10/2021  ? LDLDIRECT 142.0 08/12/2020  ? [place] 88 02/10/2021  ? ALT 54 (H) 02/10/2021  ? AST 47 (H) 02/10/2021  ? NA 140 02/10/2021  ? K 3.9 02/10/2021  ? CL 103 02/10/2021  ? CREATININE 1.00 02/10/2021  ? BUN 11 02/10/2021  ? CO2 27 02/10/2021  ? TSH 1.68 08/12/2020  ? PSA 0.9 08/14/2019  ? HGBA1C 5.8 (A) 02/10/2021  ? MICROALBUR <0.7 08/12/2020  ? ? ?CT Abdomen Pelvis W Contrast ? ?Result Date: 01/15/2020 ?CLINICAL DATA:  Epigastric pain with nausea and diarrhea. EXAM: CT ABDOMEN AND PELVIS WITH CONTRAST TECHNIQUE: Multidetector CT imaging of the abdomen and pelvis was performed using the standard protocol following bolus administration of intravenous contrast. CONTRAST:  147mL OMNIPAQUE IOHEXOL 300 MG/ML  SOLN COMPARISON:  None. FINDINGS: Lower chest: Unremarkable. Hepatobiliary: 9 mm hypodensity posterior right liver is too small to characterize but likely benign. Gallbladder is surgically absent. No  intrahepatic or extrahepatic biliary dilation. Pancreas: No focal mass lesion. No dilatation of the main duct. No intraparenchymal cyst. No peripancreatic edema. Spleen: No splenomegaly. No focal mass lesion. Adrenals/Urinary Tract: No adrenal nodule or mass. Right kidney unremarkable. 8 mm hypodensity in the interpolar left kidney is too small to characterize but likely benign. No evidence for hydroureter. Distal ureters and urinary bladder are obscured from beam hardening artifact due to bilateral hip replacement. Stomach/Bowel: Stomach is unremarkable. No gastric wall  thickening. No evidence of outlet obstruction. Duodenum is normally positioned as is the ligament of Treitz. No small bowel wall thickening. No small bowel dilatation. The terminal ileum is normal. The appendix is normal. No gross colonic mass. No colonic wall thickening. Vascular/Lymphatic: There is abdominal aortic atherosclerosis without aneurysm. There is no gastrohepatic or hepatoduodenal ligament lymphadenopathy. No retroperitoneal or mesenteric lymphadenopathy. Upper normal lymph nodes are seen in the central small bowel mesentery, nonspecific but likely reactive. No pelvic lymphadenopathy evident although portions of the inferior pelvic sidewalls are obscured bilaterally. Reproductive: Prostate gland obscured by streak beam hardening artifact. Other: No intraperitoneal free fluid. Musculoskeletal: Status post bilateral hip replacement. No worrisome lytic or sclerotic osseous abnormality. IMPRESSION: 1. No acute findings in the abdomen or pelvis. Specifically, no findings to explain the patient's history of epigastric pain and nausea. 2. Aortic Atherosclerosis (ICD10-I70.0). Electronically Signed   By: Misty Stanley M.D.   On: 01/15/2020 14:48  ? ? ?Assessment & Plan:  ? ?Problem List Items Addressed This Visit   ? ? Arthralgia  ?  Blue-Emu cream was recommended to use 2-3 times a day ? ? ?  ?  ? Asthma  ?  Continue with QVAR daily ?Use  Proair HFA prn ?  ?  ? Relevant Medications  ? predniSONE (DELTASONE) 20 MG tablet  ? Food allergy  ?  Use emergency kit prn: ? Epi-pen ?Sudafed ?Benadryl ?Prednisone ? ?  ?  ? Dyslipidemia  ?  Maintain healthy l

## 2021-05-21 NOTE — Assessment & Plan Note (Addendum)
Use emergency kit prn: ? Epi-pen ?Sudafed ?Benadryl ?Prednisone ?

## 2021-05-23 DIAGNOSIS — E1169 Type 2 diabetes mellitus with other specified complication: Secondary | ICD-10-CM | POA: Insufficient documentation

## 2021-05-23 NOTE — Assessment & Plan Note (Signed)
Lab Results  ?Component Value Date  ? HGBA1C 5.8 (A) 02/10/2021  ?Doing better ? ?

## 2021-05-23 NOTE — Assessment & Plan Note (Signed)
Controlled at present ?Lab Results  ?Component Value Date  ? HGBA1C 5.8 (A) 02/10/2021  ? ? ?

## 2021-05-23 NOTE — Assessment & Plan Note (Signed)
Continue with QVAR daily ?Use Proair HFA prn ?

## 2021-05-23 NOTE — Assessment & Plan Note (Signed)
Maintain healthy lifestyle ?

## 2021-07-01 ENCOUNTER — Other Ambulatory Visit: Payer: Self-pay | Admitting: Internal Medicine

## 2021-07-08 ENCOUNTER — Telehealth: Payer: BC Managed Care – PPO | Admitting: Physician Assistant

## 2021-07-08 ENCOUNTER — Other Ambulatory Visit (HOSPITAL_BASED_OUTPATIENT_CLINIC_OR_DEPARTMENT_OTHER): Payer: Self-pay

## 2021-07-08 DIAGNOSIS — B9689 Other specified bacterial agents as the cause of diseases classified elsewhere: Secondary | ICD-10-CM | POA: Diagnosis not present

## 2021-07-08 DIAGNOSIS — J208 Acute bronchitis due to other specified organisms: Secondary | ICD-10-CM | POA: Diagnosis not present

## 2021-07-08 MED ORDER — BENZONATATE 100 MG PO CAPS
100.0000 mg | ORAL_CAPSULE | Freq: Three times a day (TID) | ORAL | 0 refills | Status: DC | PRN
  Filled 2021-07-08: qty 30, 10d supply, fill #0

## 2021-07-08 MED ORDER — AZITHROMYCIN 250 MG PO TABS
ORAL_TABLET | ORAL | 0 refills | Status: AC
  Filled 2021-07-08: qty 6, 5d supply, fill #0

## 2021-07-08 NOTE — Progress Notes (Signed)
We are sorry that you are not feeling well.  Here is how we plan to help!  Based on your presentation I believe you most likely have A cough due to bacteria.  When patients have a fever and a productive cough with a change in color or increased sputum production, we are concerned about bacterial bronchitis.  If left untreated it can progress to pneumonia.  If your symptoms do not improve with your treatment plan it is important that you contact your provider.   I have prescribed Azithromyin 250 mg: two tablets now and then one tablet daily for 4 additonal days    In addition you may use A non-prescription cough medication called Mucinex DM: take 2 tablets every 12 hours. and A prescription cough medication called Tessalon Perles 100mg. You may take 1-2 capsules every 8 hours as needed for your cough.   From your responses in the eVisit questionnaire you describe inflammation in the upper respiratory tract which is causing a significant cough.  This is commonly called Bronchitis and has four common causes:   Allergies Viral Infections Acid Reflux Bacterial Infection Allergies, viruses and acid reflux are treated by controlling symptoms or eliminating the cause. An example might be a cough caused by taking certain blood pressure medications. You stop the cough by changing the medication. Another example might be a cough caused by acid reflux. Controlling the reflux helps control the cough.  USE OF BRONCHODILATOR ("RESCUE") INHALERS: There is a risk from using your bronchodilator too frequently.  The risk is that over-reliance on a medication which only relaxes the muscles surrounding the breathing tubes can reduce the effectiveness of medications prescribed to reduce swelling and congestion of the tubes themselves.  Although you feel brief relief from the bronchodilator inhaler, your asthma may actually be worsening with the tubes becoming more swollen and filled with mucus.  This can delay other  crucial treatments, such as oral steroid medications. If you need to use a bronchodilator inhaler daily, several times per day, you should discuss this with your provider.  There are probably better treatments that could be used to keep your asthma under control.     HOME CARE Only take medications as instructed by your medical team. Complete the entire course of an antibiotic. Drink plenty of fluids and get plenty of rest. Avoid close contacts especially the very young and the elderly Cover your mouth if you cough or cough into your sleeve. Always remember to wash your hands A steam or ultrasonic humidifier can help congestion.   GET HELP RIGHT AWAY IF: You develop worsening fever. You become short of breath You cough up blood. Your symptoms persist after you have completed your treatment plan MAKE SURE YOU  Understand these instructions. Will watch your condition. Will get help right away if you are not doing well or get worse.    Thank you for choosing an e-visit.  Your e-visit answers were reviewed by a board certified advanced clinical practitioner to complete your personal care plan. Depending upon the condition, your plan could have included both over the counter or prescription medications.  Please review your pharmacy choice. Make sure the pharmacy is open so you can pick up prescription now. If there is a problem, you may contact your provider through MyChart messaging and have the prescription routed to another pharmacy.  Your safety is important to us. If you have drug allergies check your prescription carefully.   For the next 24 hours you can use   MyChart to ask questions about today's visit, request a non-urgent call back, or ask for a work or school excuse. You will get an email in the next two days asking about your experience. I hope that your e-visit has been valuable and will speed your recovery.  I provided 5 minutes of non face-to-face time during this encounter  for chart review and documentation.   

## 2021-08-11 ENCOUNTER — Ambulatory Visit: Payer: BC Managed Care – PPO | Admitting: Internal Medicine

## 2021-08-11 ENCOUNTER — Encounter: Payer: Self-pay | Admitting: Internal Medicine

## 2021-08-11 VITALS — BP 122/74 | HR 83 | Ht 72.0 in | Wt 204.0 lb

## 2021-08-11 DIAGNOSIS — E785 Hyperlipidemia, unspecified: Secondary | ICD-10-CM

## 2021-08-11 DIAGNOSIS — E538 Deficiency of other specified B group vitamins: Secondary | ICD-10-CM | POA: Diagnosis not present

## 2021-08-11 DIAGNOSIS — E1165 Type 2 diabetes mellitus with hyperglycemia: Secondary | ICD-10-CM

## 2021-08-11 DIAGNOSIS — R7989 Other specified abnormal findings of blood chemistry: Secondary | ICD-10-CM

## 2021-08-11 LAB — POCT GLYCOSYLATED HEMOGLOBIN (HGB A1C): Hemoglobin A1C: 5.6 % (ref 4.0–5.6)

## 2021-08-11 NOTE — Patient Instructions (Addendum)
Continue Metformin 500 mg every other day

## 2021-08-11 NOTE — Progress Notes (Unsigned)
Name: Darrell Santos  Age/ Sex: 56 y.o., male   MRN/ DOB: [id number], 1965/09/08     PCP: Cassandria Anger, MD   Reason for Endocrinology Evaluation: Type 2 Diabetes Mellitus  Initial Endocrine Consultative Visit: 03/07/2019    PATIENT IDENTIFIER: [name] is a 56 y.o. male with a past medical history of HTN and T2DM. The patient has followed with Endocrinology clinic since 03/07/2019 for consultative assistance with management of his diabetes.       DIABETIC HISTORY:  Darrell Santos was diagnosed with T2DM in 01/2019, he presented to the ED with symptomatic hyperglycemia . He was discharged on Metformin. He was on Repaglinide for a short period of time but was discontinued in 02/2019 due to hypoglycemia.His hemoglobin A1c has ranged from 5.6% in 05/2019, peaking at 9.4% in 02/2019.   Rosuvastatin started 07/2020  SUBJECTIVE:   During the last visit (02/10/2021): A1c 5.5 % , we continued Metformin         Today (08/11/2021): Darrell Santos is here for a follow up on diabetes management.  He checks his blood sugars 1 times weekly. The patient has not had hypoglycemic episodes since the last clinic visit.   Denies nausea, vomiting or diarrhea  Has polydipsia that is chronic Has rare loose stools      HOME ENDOCRINE  REGIMEN:  Metformin 500 mg  1 tab every other day  Rosuvastatin 5 mg every other day  Fenofibrate 160 mg daily     Statin:yes  ACE-I/ARB: yes    METER DOWNLOAD SUMMARY:Unable to download  77-144 mg/dL     DIABETIC COMPLICATIONS: Microvascular complications:    Denies: CKD, retinopathy, neuropathy Last eye exam: Completed 06/2021   Macrovascular complications:    Denies: CAD, PVD, CVA    HISTORY:  Past Medical History:  Past Medical History:  Diagnosis Date   Allergic rhinitis    Allergy    Anxiety    Asthma    Diabetes mellitus without complication (HCC)    GERD (gastroesophageal reflux disease)    HTN (hypertension)     Hyperlipidemia    borderline   Rosacea    Past Surgical History:  Past Surgical History:  Procedure Laterality Date   CHOLECYSTECTOMY     COLONOSCOPY     NASAL SINUS SURGERY     TOTAL HIP ARTHROPLASTY     Social History:  reports that he has never smoked. He quit smokeless tobacco use about 24 years ago. He reports current alcohol use of about 30.0 standard drinks of alcohol per week. He reports that he does not use drugs. Family History:  Family History  Problem Relation Age of Onset   Hypertension Other    Hypertension Other    Diabetes Other        1st degree relative    Asthma Mother    Hypertension Father    Diabetes Father    Colon polyps Neg Hx    Colon cancer Neg Hx    Esophageal cancer Neg Hx    Rectal cancer Neg Hx    Stomach cancer Neg Hx      HOME MEDICATIONS: Allergies as of 08/11/2021       Reactions   Midazolam Shortness Of Breath   (Pre-op)- "dificulty breathing with associated heaviness in chest"    Molds & Smuts Shortness Of Breath   Other Anaphylaxis   Cats and nuts   Pollen Extract  Medication List        Accurate as of August 11, 2021  3:22 PM. If you have any questions, ask your nurse or doctor.          STOP taking these medications    benzonatate 100 MG capsule Commonly known as: TESSALON Stopped by: Dorita Sciara, MD   predniSONE 20 MG tablet Commonly known as: DELTASONE Stopped by: Dorita Sciara, MD       TAKE these medications    albuterol 108 (90 Base) MCG/ACT inhaler Commonly known as: ProAir HFA Inhale 2 puffs into the lungs every 4 (four) hours as needed for wheezing or shortness of breath.   B Complex Plus Tabs Take 1 tablet by mouth daily.   Banophen 25 MG tablet Generic drug: diphenhydrAMINE Take 2 tablets (50 mg total) by mouth every 4 (four) hours as needed (allergic reaction).   clonazePAM 0.5 MG tablet Commonly known as: KLONOPIN Take 1 tablet (0.5 mg total) by mouth daily as  needed.   diazepam 2 MG tablet Commonly known as: VALIUM Take one tablet tid for 7 days   escitalopram 10 MG tablet Commonly known as: LEXAPRO Take 1 tablet (10 mg total) by mouth daily.   esomeprazole 40 MG capsule Commonly known as: NEXIUM Take by mouth.   fenofibrate 160 MG tablet Take 1 tablet (160 mg total) by mouth daily. **Annual appointment with labs is due for further refills**   fluticasone 50 MCG/ACT nasal spray Commonly known as: FLONASE Place 1 spray into both nostrils daily.   metFORMIN 500 MG tablet Commonly known as: GLUCOPHAGE TAKE 1 TABLET DAILY WITH BREAKFAST (NOTE DECREASE IN DOSING) What changed: See the new instructions.   olmesartan-hydrochlorothiazide 20-12.5 MG tablet Commonly known as: BENICAR HCT Take 1 tablet by mouth daily.   OneTouch Verio test strip Generic drug: glucose blood Use as instructed to test blood sugar 3 times daily E11.9   OT ULTRA/FASTTK CNTRL SOLN Soln 1 Bottle by Does not apply route as directed.   pantoprazole 40 MG tablet Commonly known as: PROTONIX Take 1 tablet (40 mg total) by mouth daily.   Qvar RediHaler 80 MCG/ACT inhaler Generic drug: beclomethasone USE 1 INHALATION TWICE A DAY   rosuvastatin 5 MG tablet Commonly known as: CRESTOR Take 1 tablet (5 mg total) by mouth every other day.   SM Nasal Decongestant Max St 30 MG tablet Generic drug: pseudoephedrine Take 2 tablets (60 mg total) by mouth every 4 (four) hours as needed for congestion (allergic reaction).         OBJECTIVE:   Vital Signs: BP 122/74 (BP Location: Left Arm, Patient Position: Sitting, Cuff Size: Large)   Pulse 83   Ht 6' (1.829 m)   Wt 204 lb (92.5 kg)   SpO2 99%   BMI 27.67 kg/m   Wt Readings from Last 3 Encounters:  08/11/21 204 lb (92.5 kg)  05/21/21 206 lb (93.4 kg)  02/10/21 209 lb (94.8 kg)     Exam: General: Pt appears well and is in NAD  Lungs: Clear to auscultation    Heart: RRR with normal S1 and S2 and no  gallops; no murmurs; no rub  Abdomen: Normoactive bowel sounds, soft, nontender, without masses or organomegaly palpable  Extremities: No pretibial edema.  Skin: Normal texture and temperature to palpation.  Neuro: MS is good with appropriate affect, pt is alert and Ox3        DM foot exam: 08/11/2021   The skin of the  feet is intact without sores or ulcerations, pt with callous formation at the medial aspect of the 1st MT joint The pedal pulses are 1+ on right and 1+ on left. The sensation is intact to a screening 5.07, 10 gram monofilament bilaterally       DATA REVIEWED:  Lab Results  Component Value Date   HGBA1C 5.6 08/11/2021   HGBA1C 5.8 (A) 02/10/2021   HGBA1C 5.5 08/12/2020   Results for MIKEN, STECHER (MRN [id number]) as of 08/14/2020 07:06  Ref. Range 08/12/2020 15:54  Sodium Latest Ref Range: 135 - 145 mEq/L 140  Potassium Latest Ref Range: 3.5 - 5.1 mEq/L 4.0  Chloride Latest Ref Range: 96 - 112 mEq/L 104  CO2 Latest Ref Range: 19 - 32 mEq/L 28  Glucose Latest Ref Range: 70 - 99 mg/dL 71  BUN Latest Ref Range: 6 - 23 mg/dL 10  Creatinine Latest Ref Range: 0.40 - 1.50 mg/dL 0.91  Calcium Latest Ref Range: 8.4 - 10.5 mg/dL 9.8  Alkaline Phosphatase Latest Ref Range: 39 - 117 U/L 35 (L)  Albumin Latest Ref Range: 3.5 - 5.2 g/dL 4.7  AST Latest Ref Range: 0 - 37 U/L 29  ALT Latest Ref Range: 0 - 53 U/L 38  Total Protein Latest Ref Range: 6.0 - 8.3 g/dL 7.2  Total Bilirubin Latest Ref Range: 0.2 - 1.2 mg/dL 0.6  GFR Latest Ref Range: >60.00 mL/min 94.79  Total CHOL/HDL Ratio Unknown 4  Cholesterol Latest Ref Range: 0 - 200 mg/dL 208 (H)  HDL Cholesterol Latest Ref Range: >39.00 mg/dL 53.20  Direct LDL Latest Units: mg/dL 142.0  MICROALB/CREAT RATIO Latest Ref Range: 0.0 - 30.0 mg/g 0.7  NonHDL Unknown 154.47  Triglycerides Latest Ref Range: 0.0 - 149.0 mg/dL 215.0 (H)  VLDL Latest Ref Range: 0.0 - 40.0 mg/dL 43.0 (H)  Vitamin B12 Latest Ref Range: 211 - 911  pg/mL 277  TSH Latest Ref Range: 0.35 - 5.50 uIU/mL 1.68    ASSESSMENT / PLAN / RECOMMENDATIONS:   1) Type 2 Diabetes Mellitus, Optimally controlled, Without complications - Most recent A1c of 5.6 %. Goal A1c < 7.0%.    -A1c continues to be at goal -He would like to continue using metformin every other day or so -He is not having any side effects    MEDICATIONS: Metformin 500 mg , 1 tablet every other day  EDUCATION / INSTRUCTIONS: BG monitoring instructions: Patient is instructed to check his blood sugars 3 times a week . Call [facility] if: BG persistently < 70  I reviewed the Rule of 15 for the treatment of hypoglycemia in detail with the patient. Literature supplied.     2) Diabetic complications:  Eye: Does not have known diabetic retinopathy. Up to date  Neuro/ Feet: Does not have known diabetic peripheral neuropathy. Renal: Patient does not have known baseline CKD. He is on an ACEI/ARB at present.     3) Dyslipidemia:     - Tg has been normal and LDL at goal , but his LFT's have increased   Medication  Continue  Rosuvastatin 5 mg every other day  Continue Fenofibrate 160 mg daily   4) Elevated LFT's:   - Slight elevation of AST and ALT , due to statin therapy.    F/U in 6 months     Signed electronically by: Mack Guise, MD  [facility]  [address] [place], [place] [address] Phone: [phone] FAX: [phone]  CC: Plotnikov, Evie Lacks, MD [place] [address] Phone: [phone]  Fax: [phone]  Return to Endocrinology clinic as below: Future Appointments  Date Time Provider [place]  11/23/2021  3:00 PM Plotnikov, Evie Lacks, MD [facility] None

## 2021-08-12 LAB — COMPREHENSIVE METABOLIC PANEL
ALT: 43 U/L (ref 0–53)
AST: 46 U/L — ABNORMAL HIGH (ref 0–37)
Albumin: 4.6 g/dL (ref 3.5–5.2)
Alkaline Phosphatase: 40 U/L (ref 39–117)
BUN: 13 mg/dL (ref 6–23)
CO2: 27 mEq/L (ref 19–32)
Calcium: 9.3 mg/dL (ref 8.4–10.5)
Chloride: 104 mEq/L (ref 96–112)
Creatinine, Ser: 0.99 mg/dL (ref 0.40–1.50)
GFR: 85.07 mL/min (ref >60.00)
Glucose, Bld: 91 mg/dL (ref 70–99)
Potassium: 4 mEq/L (ref 3.5–5.1)
Sodium: 140 mEq/L (ref 135–145)
Total Bilirubin: 0.4 mg/dL (ref 0.2–1.2)
Total Protein: 7.4 g/dL (ref 6.0–8.3)

## 2021-08-12 LAB — MICROALBUMIN / CREATININE URINE RATIO
Creatinine,U: 81.7 mg/dL
Microalb Creat Ratio: 0.9 mg/g (ref 0.0–30.0)
Microalb, Ur: <0.7 mg/dL (ref 0.0–1.9)

## 2021-08-12 LAB — LIPID PANEL
Cholesterol: 170 mg/dL (ref 0–200)
HDL: 56.9 mg/dL
LDL Cholesterol: 88 mg/dL (ref 0–99)
NonHDL: 112.98
Total CHOL/HDL Ratio: 3
Triglycerides: 127 mg/dL (ref 0.0–149.0)
VLDL: 25.4 mg/dL (ref 0.0–40.0)

## 2021-08-12 LAB — VITAMIN B12: Vitamin B-12: 651 pg/mL (ref 211–911)

## 2021-09-17 ENCOUNTER — Other Ambulatory Visit (HOSPITAL_BASED_OUTPATIENT_CLINIC_OR_DEPARTMENT_OTHER): Payer: Self-pay

## 2021-09-17 ENCOUNTER — Other Ambulatory Visit: Payer: Self-pay | Admitting: Internal Medicine

## 2021-09-17 ENCOUNTER — Telehealth: Payer: BC Managed Care – PPO | Admitting: Physician Assistant

## 2021-09-17 DIAGNOSIS — J208 Acute bronchitis due to other specified organisms: Secondary | ICD-10-CM | POA: Diagnosis not present

## 2021-09-17 DIAGNOSIS — B9689 Other specified bacterial agents as the cause of diseases classified elsewhere: Secondary | ICD-10-CM

## 2021-09-17 MED ORDER — BENZONATATE 100 MG PO CAPS
100.0000 mg | ORAL_CAPSULE | Freq: Three times a day (TID) | ORAL | 0 refills | Status: DC | PRN
  Filled 2021-09-17: qty 30, 10d supply, fill #0

## 2021-09-17 MED ORDER — AZITHROMYCIN 250 MG PO TABS
250.0000 mg | ORAL_TABLET | Freq: Every day | ORAL | 0 refills | Status: AC
  Filled 2021-09-17: qty 6, 5d supply, fill #0

## 2021-09-17 NOTE — Progress Notes (Signed)

## 2021-09-17 NOTE — Progress Notes (Signed)
I have spent 5 minutes in review of e-visit questionnaire, review and updating patient chart, medical decision making and response to patient.   Brendon Christoffel Cody Kay Ricciuti, PA-C    

## 2021-09-25 DIAGNOSIS — J0100 Acute maxillary sinusitis, unspecified: Secondary | ICD-10-CM | POA: Diagnosis not present

## 2021-10-03 ENCOUNTER — Telehealth: Payer: BC Managed Care – PPO | Admitting: Nurse Practitioner

## 2021-10-03 DIAGNOSIS — B370 Candidal stomatitis: Secondary | ICD-10-CM | POA: Diagnosis not present

## 2021-10-03 MED ORDER — NYSTATIN 100000 UNIT/ML MT SUSP: 5.0000 mL | Freq: Four times a day (QID) | OROMUCOSAL | 0 refills | Status: DC

## 2021-10-03 NOTE — Progress Notes (Signed)
We are sorry that you are not feeling well.  Here is how we plan to help!  Based on what you have shared with me it does look like you have a thrush.    Ritta Slot is a yeast infection of the mouth or oral cavity. I have prescribed nystatin to swish in mouth 4x a day.    HOME CARE:  Wash your hands frequently. Do not pick at or rub the sore. Don't open the blisters. Avoid kissing other people during this time. Avoid sharing drinking glasses, eating utensils, or razors. Do not handle contact lenses unless you have thoroughly washed your hands with soap and warm water! Avoid oral sex during this time.  Herpes from sores on your mouth can spread to your partner's genital area. Avoid contact with anyone who has eczema or a weakened immune system. Cold sores are often triggered by exposure to intense sunlight, use a lip balm containing a sunscreen (SPF 30 or higher).  GET HELP RIGHT AWAY IF:  Blisters look infected. Blisters occur near or in the eye. Symptoms last longer than 10 days. Your symptoms become worse.  MAKE SURE YOU:  Understand these instructions. Will watch your condition. Will get help right away if you are not doing well or get worse.    Your e-visit answers were reviewed by a board certified advanced clinical practitioner to complete your personal care plan.  Depending upon the condition, your plan could have  Included both over the counter or prescription medications.    Please review your pharmacy choice.  Be sure that the pharmacy you have chosen is open so that you can pick up your prescription now.  If there is a problem you can message your provider in [place] to have the prescription routed to another pharmacy.    Your safety is important to [place].  If you have drug allergies check our prescription carefully.  For the next 24 hours you can use MyChart to ask questions about today's visit, request a non-urgent call back, or ask for a work or school excuse from your  e-visit provider.  You will get an email in the next two days asking about your experience.  I hope that your e-visit has been valuable and will speed your recovery.   5-10 minutes spent reviewing and documenting in chart.

## 2021-11-16 ENCOUNTER — Other Ambulatory Visit: Payer: Self-pay | Admitting: Internal Medicine

## 2021-11-18 ENCOUNTER — Encounter: Payer: Self-pay | Admitting: Internal Medicine

## 2021-11-23 ENCOUNTER — Encounter: Payer: Self-pay | Admitting: Internal Medicine

## 2021-11-23 ENCOUNTER — Ambulatory Visit: Payer: BC Managed Care – PPO | Admitting: Internal Medicine

## 2021-11-23 VITALS — BP 126/78 | HR 82 | Temp 98.2°F | Ht 72.0 in | Wt 208.4 lb

## 2021-11-23 DIAGNOSIS — E669 Obesity, unspecified: Secondary | ICD-10-CM

## 2021-11-23 DIAGNOSIS — E1169 Type 2 diabetes mellitus with other specified complication: Secondary | ICD-10-CM

## 2021-11-23 DIAGNOSIS — Z23 Encounter for immunization: Secondary | ICD-10-CM | POA: Diagnosis not present

## 2021-11-23 DIAGNOSIS — F411 Generalized anxiety disorder: Secondary | ICD-10-CM

## 2021-11-23 DIAGNOSIS — I10 Essential (primary) hypertension: Secondary | ICD-10-CM | POA: Diagnosis not present

## 2021-11-23 DIAGNOSIS — Z0000 Encounter for general adult medical examination without abnormal findings: Secondary | ICD-10-CM | POA: Diagnosis not present

## 2021-11-23 DIAGNOSIS — E785 Hyperlipidemia, unspecified: Secondary | ICD-10-CM

## 2021-11-23 LAB — COMPREHENSIVE METABOLIC PANEL
ALT: 48 U/L (ref 0–53)
AST: 64 U/L — ABNORMAL HIGH (ref 0–37)
Albumin: 4.5 g/dL (ref 3.5–5.2)
Alkaline Phosphatase: 44 U/L (ref 39–117)
BUN: 14 mg/dL (ref 6–23)
CO2: 25 mEq/L (ref 19–32)
Calcium: 9.4 mg/dL (ref 8.4–10.5)
Chloride: 106 mEq/L (ref 96–112)
Creatinine, Ser: 0.95 mg/dL (ref 0.40–1.50)
GFR: 89.21 mL/min (ref >60.00)
Glucose, Bld: 84 mg/dL (ref 70–99)
Potassium: 3.8 mEq/L (ref 3.5–5.1)
Sodium: 141 mEq/L (ref 135–145)
Total Bilirubin: 0.7 mg/dL (ref 0.2–1.2)
Total Protein: 7.1 g/dL (ref 6.0–8.3)

## 2021-11-23 LAB — CBC WITH DIFFERENTIAL/PLATELET
Basophils Absolute: 0.1 10*3/uL (ref 0.0–0.1)
Basophils Relative: 0.8 % (ref 0.0–3.0)
Eosinophils Absolute: 0.4 10*3/uL (ref 0.0–0.7)
Eosinophils Relative: 5.8 % — ABNORMAL HIGH (ref 0.0–5.0)
HCT: 41.2 % (ref 39.0–52.0)
Hemoglobin: 13.8 g/dL (ref 13.0–17.0)
Lymphocytes Relative: 30.6 % (ref 12.0–46.0)
Lymphs Abs: 2 10*3/uL (ref 0.7–4.0)
MCHC: 33.5 g/dL (ref 30.0–36.0)
MCV: 93.6 fl (ref 78.0–100.0)
Monocytes Absolute: 0.6 10*3/uL (ref 0.1–1.0)
Monocytes Relative: 9.7 % (ref 3.0–12.0)
Neutro Abs: 3.4 10*3/uL (ref 1.4–7.7)
Neutrophils Relative %: 53.1 % (ref 43.0–77.0)
Platelets: 186 10*3/uL (ref 150.0–400.0)
RBC: 4.4 Mil/uL (ref 4.22–5.81)
RDW: 13.3 % (ref 11.5–15.5)
WBC: 6.5 10*3/uL (ref 4.0–10.5)

## 2021-11-23 LAB — HEMOGLOBIN A1C: Hgb A1c MFr Bld: 6.1 % (ref 4.6–6.5)

## 2021-11-23 LAB — LIPID PANEL
Cholesterol: 175 mg/dL (ref 0–200)
HDL: 58.7 mg/dL (ref >39.00)
LDL Cholesterol: 82 mg/dL (ref 0–99)
NonHDL: 116.51
Total CHOL/HDL Ratio: 3
Triglycerides: 172 mg/dL — ABNORMAL HIGH (ref 0.0–149.0)
VLDL: 34.4 mg/dL (ref 0.0–40.0)

## 2021-11-23 LAB — TSH: TSH: 1.06 u[IU]/mL (ref 0.35–5.50)

## 2021-11-23 LAB — PSA: PSA: 0.56 ng/mL (ref 0.10–4.00)

## 2021-11-23 MED ORDER — CLONAZEPAM 0.5 MG PO TABS: 0.5000 mg | ORAL_TABLET | Freq: Every day | ORAL | 1 refills | Status: DC | PRN

## 2021-11-23 NOTE — Assessment & Plan Note (Signed)
Clonazepam prn Public speaking anxiety  Potential benefits of a long term benzodiazepines  use as well as potential risks  and complications were explained to the patient and were aknowledged.

## 2021-11-23 NOTE — Assessment & Plan Note (Signed)
Check A1c. 

## 2021-11-23 NOTE — Progress Notes (Signed)
Subjective:  Patient ID: Darrell Santos, male    DOB: 1965-03-07  Age: 56 y.o. MRN: [id number]  CC: Follow-up (6 month f/u- Flu shot)   HPI Darrell Santos presents for HTN, asthma, anxiety, DM  Outpatient Medications Prior to Visit  Medication Sig Dispense Refill   albuterol (PROAIR HFA) 108 (90 Base) MCG/ACT inhaler Inhale 2 puffs into the lungs every 4 (four) hours as needed for wheezing or shortness of breath. 56 g 3   B Complex-Folic Acid (B COMPLEX PLUS) TABS Take 1 tablet by mouth daily. 100 tablet 3   Blood Glucose Calibration (OT ULTRA/FASTTK CNTRL SOLN) SOLN 1 Bottle by Does not apply route as directed. 2 each 0   diphenhydrAMINE (BENADRYL) 25 MG tablet Take 2 tablets (50 mg total) by mouth every 4 (four) hours as needed (allergic reaction). 100 tablet 0   escitalopram (LEXAPRO) 10 MG tablet Take 1 tablet (10 mg total) by mouth daily. Annual appt is due must see provider for future refills 90 tablet 0   esomeprazole (NEXIUM) 40 MG capsule Take by mouth.     fenofibrate 160 MG tablet Take 1 tablet (160 mg total) by mouth daily. **Annual appointment with labs is due for further refills** 90 tablet 3   fluticasone (FLONASE) 50 MCG/ACT nasal spray Place 1 spray into both nostrils daily. 15.8 mL 0   glucose blood (ONETOUCH VERIO) test strip Use as instructed to test blood sugar 3 times daily E11.9 100 each 12   metFORMIN (GLUCOPHAGE) 500 MG tablet 1 tab every other day 90 tablet 3   olmesartan-hydrochlorothiazide (BENICAR HCT) 20-12.5 MG tablet Take 1 tablet by mouth daily. 30 tablet 3   pantoprazole (PROTONIX) 40 MG tablet Take 1 tablet (40 mg total) by mouth daily. Annual appt is due must see provider for future refills 90 tablet 0   pseudoephedrine (SM NASAL DECONGESTANT MAX ST) 30 MG tablet Take 2 tablets (60 mg total) by mouth every 4 (four) hours as needed for congestion (allergic reaction). 48 tablet 0   QVAR REDIHALER 80 MCG/ACT inhaler USE 1 INHALATION TWICE A DAY 31.8  g 5   rosuvastatin (CRESTOR) 5 MG tablet Take 1 tablet (5 mg total) by mouth every other day. 90 tablet 0   clonazePAM (KLONOPIN) 0.5 MG tablet Take 1 tablet (0.5 mg total) by mouth daily as needed. 90 tablet 1   diazepam (VALIUM) 2 MG tablet Take one tablet tid for 7 days     benzonatate (TESSALON) 100 MG capsule Take 1 capsule (100 mg total) by mouth 3 (three) times daily as needed for cough. (Patient not taking: Reported on 11/23/2021) 30 capsule 0   nystatin (MYCOSTATIN) 100000 UNIT/ML suspension Take 5 mLs (500,000 Units total) by mouth 4 (four) times daily. (Patient not taking: Reported on 11/23/2021) 60 mL 0   No facility-administered medications prior to visit.    ROS: Review of Systems  Constitutional:  Negative for appetite change, fatigue and unexpected weight change.  HENT:  Negative for congestion, nosebleeds, sneezing, sore throat and trouble swallowing.   Eyes:  Negative for itching and visual disturbance.  Respiratory:  Negative for cough.   Cardiovascular:  Negative for chest pain, palpitations and leg swelling.  Gastrointestinal:  Negative for abdominal distention, blood in stool, diarrhea and nausea.  Genitourinary:  Negative for frequency and hematuria.  Musculoskeletal:  Negative for back pain, gait problem, joint swelling and neck pain.  Skin:  Negative for rash.  Neurological:  Negative for dizziness, tremors,  speech difficulty and weakness.  Psychiatric/Behavioral:  Negative for agitation, dysphoric mood and sleep disturbance. The patient is not nervous/anxious.     Objective:  BP 126/78 (BP Location: Left Arm)   Pulse 82   Temp 98.2 F (36.8 C) (Oral)   Ht 6' (1.829 m)   Wt 208 lb 6.4 oz (94.5 kg)   SpO2 97%   BMI 28.26 kg/m   BP Readings from Last 3 Encounters:  11/23/21 126/78  08/11/21 122/74  05/21/21 130/78    Wt Readings from Last 3 Encounters:  11/23/21 208 lb 6.4 oz (94.5 kg)  08/11/21 204 lb (92.5 kg)  05/21/21 206 lb (93.4 kg)     Physical Exam Constitutional:      General: He is not in acute distress.    Appearance: He is well-developed. He is obese.     Comments: NAD  Eyes:     Conjunctiva/sclera: Conjunctivae normal.     Pupils: Pupils are equal, round, and reactive to light.  Neck:     Thyroid: No thyromegaly.     Vascular: No JVD.  Cardiovascular:     Rate and Rhythm: Normal rate and regular rhythm.     Heart sounds: Normal heart sounds. No murmur heard.    No friction rub. No gallop.  Pulmonary:     Effort: Pulmonary effort is normal. No respiratory distress.     Breath sounds: Normal breath sounds. No wheezing or rales.  Chest:     Chest wall: No tenderness.  Abdominal:     General: Bowel sounds are normal. There is no distension.     Palpations: Abdomen is soft. There is no mass.     Tenderness: There is no abdominal tenderness. There is no guarding or rebound.  Musculoskeletal:        General: No tenderness. Normal range of motion.     Cervical back: Normal range of motion.  Lymphadenopathy:     Cervical: No cervical adenopathy.  Skin:    General: Skin is warm and dry.     Findings: No rash.  Neurological:     Mental Status: He is alert and oriented to person, place, and time.     Cranial Nerves: No cranial nerve deficit.     Motor: No abnormal muscle tone.     Coordination: Coordination normal.     Gait: Gait normal.     Deep Tendon Reflexes: Reflexes are normal and symmetric.  Psychiatric:        Behavior: Behavior normal.        Thought Content: Thought content normal.        Judgment: Judgment normal.     Lab Results  Component Value Date   WBC 9.1 01/14/2020   HGB 15.2 01/14/2020   HCT 43.8 01/14/2020   PLT 177.0 01/14/2020   GLUCOSE 91 08/11/2021   CHOL 170 08/11/2021   TRIG 127.0 08/11/2021   HDL 56.90 08/11/2021   LDLDIRECT 142.0 08/12/2020   LDLCALC 88 08/11/2021   ALT 43 08/11/2021   AST 46 (H) 08/11/2021   NA 140 08/11/2021   K 4.0 08/11/2021   CL 104  08/11/2021   CREATININE 0.99 08/11/2021   BUN 13 08/11/2021   CO2 27 08/11/2021   TSH 1.68 08/12/2020   PSA 0.9 08/14/2019   HGBA1C 5.6 08/11/2021   MICROALBUR <0.7 08/11/2021    CT Abdomen Pelvis W Contrast  Result Date: 01/15/2020 CLINICAL DATA:  Epigastric pain with nausea and diarrhea. EXAM: CT ABDOMEN AND PELVIS  WITH CONTRAST TECHNIQUE: Multidetector CT imaging of the abdomen and pelvis was performed using the standard protocol following bolus administration of intravenous contrast. CONTRAST:  153m OMNIPAQUE IOHEXOL 300 MG/ML  SOLN COMPARISON:  None. FINDINGS: Lower chest: Unremarkable. Hepatobiliary: 9 mm hypodensity posterior right liver is too small to characterize but likely benign. Gallbladder is surgically absent. No intrahepatic or extrahepatic biliary dilation. Pancreas: No focal mass lesion. No dilatation of the main duct. No intraparenchymal cyst. No peripancreatic edema. Spleen: No splenomegaly. No focal mass lesion. Adrenals/Urinary Tract: No adrenal nodule or mass. Right kidney unremarkable. 8 mm hypodensity in the interpolar left kidney is too small to characterize but likely benign. No evidence for hydroureter. Distal ureters and urinary bladder are obscured from beam hardening artifact due to bilateral hip replacement. Stomach/Bowel: Stomach is unremarkable. No gastric wall thickening. No evidence of outlet obstruction. Duodenum is normally positioned as is the ligament of Treitz. No small bowel wall thickening. No small bowel dilatation. The terminal ileum is normal. The appendix is normal. No gross colonic mass. No colonic wall thickening. Vascular/Lymphatic: There is abdominal aortic atherosclerosis without aneurysm. There is no gastrohepatic or hepatoduodenal ligament lymphadenopathy. No retroperitoneal or mesenteric lymphadenopathy. Upper normal lymph nodes are seen in the central small bowel mesentery, nonspecific but likely reactive. No pelvic lymphadenopathy evident  although portions of the inferior pelvic sidewalls are obscured bilaterally. Reproductive: Prostate gland obscured by streak beam hardening artifact. Other: No intraperitoneal free fluid. Musculoskeletal: Status post bilateral hip replacement. No worrisome lytic or sclerotic osseous abnormality. IMPRESSION: 1. No acute findings in the abdomen or pelvis. Specifically, no findings to explain the patient's history of epigastric pain and nausea. 2. Aortic Atherosclerosis (ICD10-I70.0). Electronically Signed   By: EMisty StanleyM.D.   On: 01/15/2020 14:48    Assessment & Plan:   Problem List Items Addressed This Visit     Anxiety state    Clonazepam prn Public speaking anxiety  Potential benefits of a long term benzodiazepines  use as well as potential risks  and complications were explained to the patient and were aknowledged.      Diabetes mellitus type 2 in obese ([facility]    Check A1c      Dyslipidemia    Coronary calcium CT score of 0. This was 0 percentile for age and sex matched control.      Essential hypertension    On fenofibrate and Crestor         Meds ordered this encounter  Medications   clonazePAM (KLONOPIN) 0.5 MG tablet    Sig: Take 1 tablet (0.5 mg total) by mouth daily as needed.    Dispense:  90 tablet    Refill:  1      Follow-up: Return in about 3 months (around 02/23/2022) for Wellness Exam.  AWalker Kehr MD

## 2021-11-23 NOTE — Assessment & Plan Note (Signed)
Coronary calcium CT score of 0. This was 0 percentile for age and sex matched control.

## 2021-11-23 NOTE — Assessment & Plan Note (Signed)
On fenofibrate and Crestor

## 2021-11-24 LAB — URINALYSIS
Bilirubin Urine: NEGATIVE
Hgb urine dipstick: NEGATIVE
Ketones, ur: NEGATIVE
Leukocytes,Ua: NEGATIVE
Nitrite: NEGATIVE
Specific Gravity, Urine: 1.015 (ref 1.000–1.030)
Total Protein, Urine: NEGATIVE
Urine Glucose: NEGATIVE
Urobilinogen, UA: 0.2 (ref 0.0–1.0)
pH: 7 (ref 5.0–8.0)

## 2021-12-08 ENCOUNTER — Other Ambulatory Visit: Payer: Self-pay | Admitting: Internal Medicine

## 2021-12-08 DIAGNOSIS — H35372 Puckering of macula, left eye: Secondary | ICD-10-CM | POA: Diagnosis not present

## 2021-12-11 ENCOUNTER — Other Ambulatory Visit: Payer: Self-pay | Admitting: Internal Medicine

## 2021-12-14 ENCOUNTER — Other Ambulatory Visit: Payer: Self-pay | Admitting: Internal Medicine

## 2021-12-23 ENCOUNTER — Other Ambulatory Visit: Payer: Self-pay

## 2021-12-23 MED ORDER — FENOFIBRATE 160 MG PO TABS: ORAL_TABLET | ORAL | 3 refills | Status: DC

## 2021-12-24 ENCOUNTER — Telehealth: Payer: Self-pay | Admitting: Licensed Clinical Social Worker

## 2021-12-24 NOTE — Patient Outreach (Signed)
  Care Coordination   12/24/2021 Name: Darrell Santos MRN: [id number] DOB: Jun 17, 1965   Care Coordination Outreach Attempts:  An unsuccessful telephone outreach was attempted today to offer the patient information about available care coordination services as a benefit of their health plan.   Follow Up Plan:  Additional outreach attempts will be made to offer the patient care coordination information and services.   Encounter Outcome:  No Answer   Care Coordination Interventions:  No, not indicated    Casimer Lanius, [place] [phone]

## 2022-01-01 DIAGNOSIS — H35342 Macular cyst, hole, or pseudohole, left eye: Secondary | ICD-10-CM | POA: Diagnosis not present

## 2022-01-11 DIAGNOSIS — H2513 Age-related nuclear cataract, bilateral: Secondary | ICD-10-CM | POA: Diagnosis not present

## 2022-01-11 DIAGNOSIS — H35412 Lattice degeneration of retina, left eye: Secondary | ICD-10-CM | POA: Insufficient documentation

## 2022-01-11 DIAGNOSIS — H35342 Macular cyst, hole, or pseudohole, left eye: Secondary | ICD-10-CM | POA: Diagnosis not present

## 2022-01-11 DIAGNOSIS — H3581 Retinal edema: Secondary | ICD-10-CM | POA: Insufficient documentation

## 2022-02-03 ENCOUNTER — Other Ambulatory Visit: Payer: Self-pay | Admitting: Internal Medicine

## 2022-02-17 ENCOUNTER — Ambulatory Visit: Payer: BC Managed Care – PPO | Admitting: Internal Medicine

## 2022-02-17 ENCOUNTER — Encounter: Payer: Self-pay | Admitting: Internal Medicine

## 2022-02-17 VITALS — BP 114/82 | HR 85 | Ht 79.0 in | Wt 209.4 lb

## 2022-02-17 DIAGNOSIS — E785 Hyperlipidemia, unspecified: Secondary | ICD-10-CM | POA: Diagnosis not present

## 2022-02-17 DIAGNOSIS — R7989 Other specified abnormal findings of blood chemistry: Secondary | ICD-10-CM | POA: Diagnosis not present

## 2022-02-17 DIAGNOSIS — E119 Type 2 diabetes mellitus without complications: Secondary | ICD-10-CM

## 2022-02-17 LAB — POCT GLYCOSYLATED HEMOGLOBIN (HGB A1C): Hemoglobin A1C: 5.8 % — AB (ref 4.0–5.6)

## 2022-02-17 NOTE — Patient Instructions (Signed)
Continue Metformin 500 mg, half a tablet every other day  Continue Rosuvastatin 5 mg ,1 tablet  every other day  Continue Fenofibrate 160 mg daily

## 2022-02-17 NOTE — Progress Notes (Signed)
Name: Darrell Santos  Age/ Sex: 57 y.o., male   MRN/ DOB: [id number], 1965/11/23     PCP: Cassandria Anger, MD   Reason for Endocrinology Evaluation: Type 2 Diabetes Mellitus  Initial Endocrine Consultative Visit: 03/07/2019    PATIENT IDENTIFIER: [name] is a 57 y.o. male with a past medical history of HTN and T2DM. The patient has followed with Endocrinology clinic since 03/07/2019 for consultative assistance with management of his diabetes.       DIABETIC HISTORY:  [name] was diagnosed with T2DM in 01/2019, he presented to the ED with symptomatic hyperglycemia . He was discharged on Metformin. He was on Repaglinide for a short period of time but was discontinued in 02/2019 due to hypoglycemia.His hemoglobin A1c has ranged from 5.6% in 05/2019, peaking at 9.4% in 02/2019.   Rosuvastatin started 07/2020  SUBJECTIVE:   During the last visit (02/10/2021): A1c 5.5 % , we continued Metformin         Today (02/17/2022): Mr. Cryer is here for a follow up on diabetes management.  He checks his blood sugars 1 times weekly. The patient has not had hypoglycemic episodes since the last clinic visit.   Denies nausea, vomiting or  Has chronic and occasional diarrhea since cholecystectomy  Father passed away with MI a couple of weeks ago    [place]:  Metformin 500 mg  half a tab every other day  Rosuvastatin 5 mg every other day  Fenofibrate 160 mg daily     Statin:yes  ACE-I/ARB: yes    METER DOWNLOAD SUMMARY:Unable to download  67-145 mg/dL     DIABETIC COMPLICATIONS: Microvascular complications:    Denies: CKD, retinopathy, neuropathy Last eye exam: Completed 06/2021   Macrovascular complications:    Denies: CAD, PVD, CVA    HISTORY:  Past Medical History:  Past Medical History:  Diagnosis Date   Allergic rhinitis    Allergy    Anxiety    Asthma    Diabetes mellitus without complication ([place])    GERD  (gastroesophageal reflux disease)    HTN (hypertension)    Hyperlipidemia    borderline   Rosacea    Past Surgical History:  Past Surgical History:  Procedure Laterality Date   CHOLECYSTECTOMY     COLONOSCOPY     NASAL SINUS SURGERY     TOTAL HIP ARTHROPLASTY     Social History:  reports that he has never smoked. He quit smokeless tobacco use about 24 years ago. He reports current alcohol use of about 30.0 standard drinks of alcohol per week. He reports that he does not use drugs. Family History:  Family History  Problem Relation Age of Onset   Hypertension Other    Hypertension Other    Diabetes Other        1st degree relative    Asthma Mother    Hypertension Father    Diabetes Father    Colon polyps Neg Hx    Colon cancer Neg Hx    Esophageal cancer Neg Hx    Rectal cancer Neg Hx    Stomach cancer Neg Hx      HOME MEDICATIONS: Allergies as of 02/17/2022       Reactions   Midazolam Shortness Of Breath   (Pre-op)- "dificulty breathing with associated heaviness in chest"    Molds & Smuts Shortness Of Breath   Other Anaphylaxis   Cats and nuts   Pollen Extract  Medication List        Accurate as of February 17, 2022  3:13 PM. If you have any questions, ask your nurse or doctor.          albuterol 108 (90 Base) MCG/ACT inhaler Commonly known as: ProAir HFA Inhale 2 puffs into the lungs every 4 (four) hours as needed for wheezing or shortness of breath.   B Complex Plus Tabs Take 1 tablet by mouth daily.   Banophen 25 MG tablet Generic drug: diphenhydrAMINE Take 2 tablets (50 mg total) by mouth every 4 (four) hours as needed (allergic reaction).   clonazePAM 0.5 MG tablet Commonly known as: KLONOPIN Take 1 tablet (0.5 mg total) by mouth daily as needed.   escitalopram 10 MG tablet Commonly known as: LEXAPRO Take 1 tablet (10 mg total) by mouth daily.   esomeprazole 40 MG capsule Commonly known as: NEXIUM Take by mouth.   fenofibrate  160 MG tablet Take 1 tablet daily   fluticasone 50 MCG/ACT nasal spray Commonly known as: FLONASE Place 1 spray into both nostrils daily.   metFORMIN 500 MG tablet Commonly known as: GLUCOPHAGE 1 tab every other day   olmesartan-hydrochlorothiazide 20-12.5 MG tablet Commonly known as: BENICAR HCT TAKE 1 TABLET DAILY   OneTouch Verio test strip Generic drug: glucose blood Use as instructed to test blood sugar 3 times daily E11.9   OT ULTRA/FASTTK CNTRL SOLN Soln 1 Bottle by Does not apply route as directed.   pantoprazole 40 MG tablet Commonly known as: PROTONIX Take 1 tablet (40 mg total) by mouth daily.   Qvar RediHaler 80 MCG/ACT inhaler Generic drug: beclomethasone USE 1 INHALATION TWICE A DAY   rosuvastatin 5 MG tablet Commonly known as: CRESTOR TAKE 1 TABLET EVERY OTHER DAY   SM Nasal Decongestant Max St 30 MG tablet Generic drug: pseudoephedrine Take 2 tablets (60 mg total) by mouth every 4 (four) hours as needed for congestion (allergic reaction).         OBJECTIVE:   Vital Signs: Ht '6\' 7"'$  (2.007 m)   Wt 209 lb 6.4 oz (95 kg)   BMI 23.59 kg/m   Wt Readings from Last 3 Encounters:  02/17/22 209 lb 6.4 oz (95 kg)  11/23/21 208 lb 6.4 oz (94.5 kg)  08/11/21 204 lb (92.5 kg)     Exam: General: Pt appears well and is in NAD  Lungs: Clear to auscultation    Heart: RRR   Abdomen: Normoactive bowel sounds, soft, nontender, without masses or organomegaly palpable  Extremities: No pretibial edema.  Neuro: MS is good with appropriate affect, pt is alert and Ox3        DM foot exam: 08/11/2021   The skin of the feet is intact without sores or ulcerations, pt with callous formation at the medial aspect of the 1st MT joint The pedal pulses are 1+ on right and 1+ on left. The sensation is intact to a screening 5.07, 10 gram monofilament bilaterally       DATA REVIEWED:  Lab Results  Component Value Date   HGBA1C 5.8 (A) 02/17/2022   HGBA1C 6.1  11/23/2021   HGBA1C 5.6 08/11/2021     Latest Reference Range & Units 11/23/21 15:48  Sodium 135 - 145 mEq/L 141  Potassium 3.5 - 5.1 mEq/L 3.8  Chloride 96 - 112 mEq/L 106  CO2 19 - 32 mEq/L 25  Glucose 70 - 99 mg/dL 84  BUN 6 - 23 mg/dL 14  Creatinine 0.40 - 1.50  mg/dL 0.95  Calcium 8.4 - 10.5 mg/dL 9.4  Alkaline Phosphatase 39 - 117 U/L 44  Albumin 3.5 - 5.2 g/dL 4.5  AST 0 - 37 U/L 64 (H)  ALT 0 - 53 U/L 48  Total Protein 6.0 - 8.3 g/dL 7.1  Total Bilirubin 0.2 - 1.2 mg/dL 0.7  GFR >60.00 mL/min 89.21    Latest Reference Range & Units 11/23/21 15:48  Total CHOL/HDL Ratio  3  Cholesterol 0 - 200 mg/dL 175  HDL Cholesterol >39.00 mg/dL 58.70  LDL (calc) 0 - 99 mg/dL 82  NonHDL  116.51  Triglycerides 0.0 - 149.0 mg/dL 172.0 (H)  VLDL 0.0 - 40.0 mg/dL 34.4  (H): Data is abnormally high     Latest Reference Range & Units 08/11/21 15:42  Creatinine,U mg/dL 81.7  Microalb, Ur 0.0 - 1.9 mg/dL <0.7  MICROALB/CREAT RATIO 0.0 - 30.0 mg/g 0.9      ASSESSMENT / PLAN / RECOMMENDATIONS:   1) Type 2 Diabetes Mellitus, Optimally controlled, Without complications - Most recent A1c of 5.8 %. Goal A1c < 7.0%.    -A1c continues to be at goal -I did recommend discontinuing metformin, but he would like to continue on a small dose    MEDICATIONS: Metformin 500 mg , half a tablet every other day  EDUCATION / INSTRUCTIONS: BG monitoring instructions: Patient is instructed to check his blood sugars 3 times a week . Call [place] [facility] clinic if: BG persistently < 70  I reviewed the Rule of 15 for the treatment of hypoglycemia in detail with the patient. Literature supplied.     2) Diabetic complications:  Eye: Does not have known diabetic retinopathy. Up to date  Neuro/ Feet: Does not have known diabetic peripheral neuropathy. Renal: Patient does not have known baseline CKD. He is on an ACEI/ARB at present.     3) Dyslipidemia:     -LDL at goal, TG slightly  elevated -Unfortunately he lost his father a few weeks ago for a cardiac event -He is believing in the importance of statin therapy  Medication  Continue  Rosuvastatin 5 mg every other day  Continue Fenofibrate 160 mg daily   4) Elevated LFT's:   -Patient with history of LFT elevation while on statin therapy, his ALT has remained normal but his AST has been trending up, I have discussed the importance of limiting EtOH intake to no more than 2 drinks a day, he will work on that     F/U in 6 months     Signed electronically by: Mack Guise, MD  [facility] End[facility]in[facility]  [facility] [address] [place], [place] [address] Phone: [phone] FAX: [phone]   CC: Cassandria Anger, MD [place] [address] Phone: [phone]  Fax: [phone]  Return to Endocrinology clinic as below: Future Appointments  Date Time Provider [place]  05/25/2022  3:00 PM Plotnikov, Evie Lacks, MD [id number] None

## 2022-03-08 ENCOUNTER — Telehealth: Payer: BC Managed Care – PPO | Admitting: Physician Assistant

## 2022-03-08 DIAGNOSIS — U071 COVID-19: Secondary | ICD-10-CM

## 2022-03-08 IMAGING — DX DG CHEST 2V
2 series · 2 of 2 positions shown · non-contrast
Comparison: None.

CLINICAL DATA: Productive cough and fatigue.

EXAM:
CHEST - 2 VIEW

[chest pa]
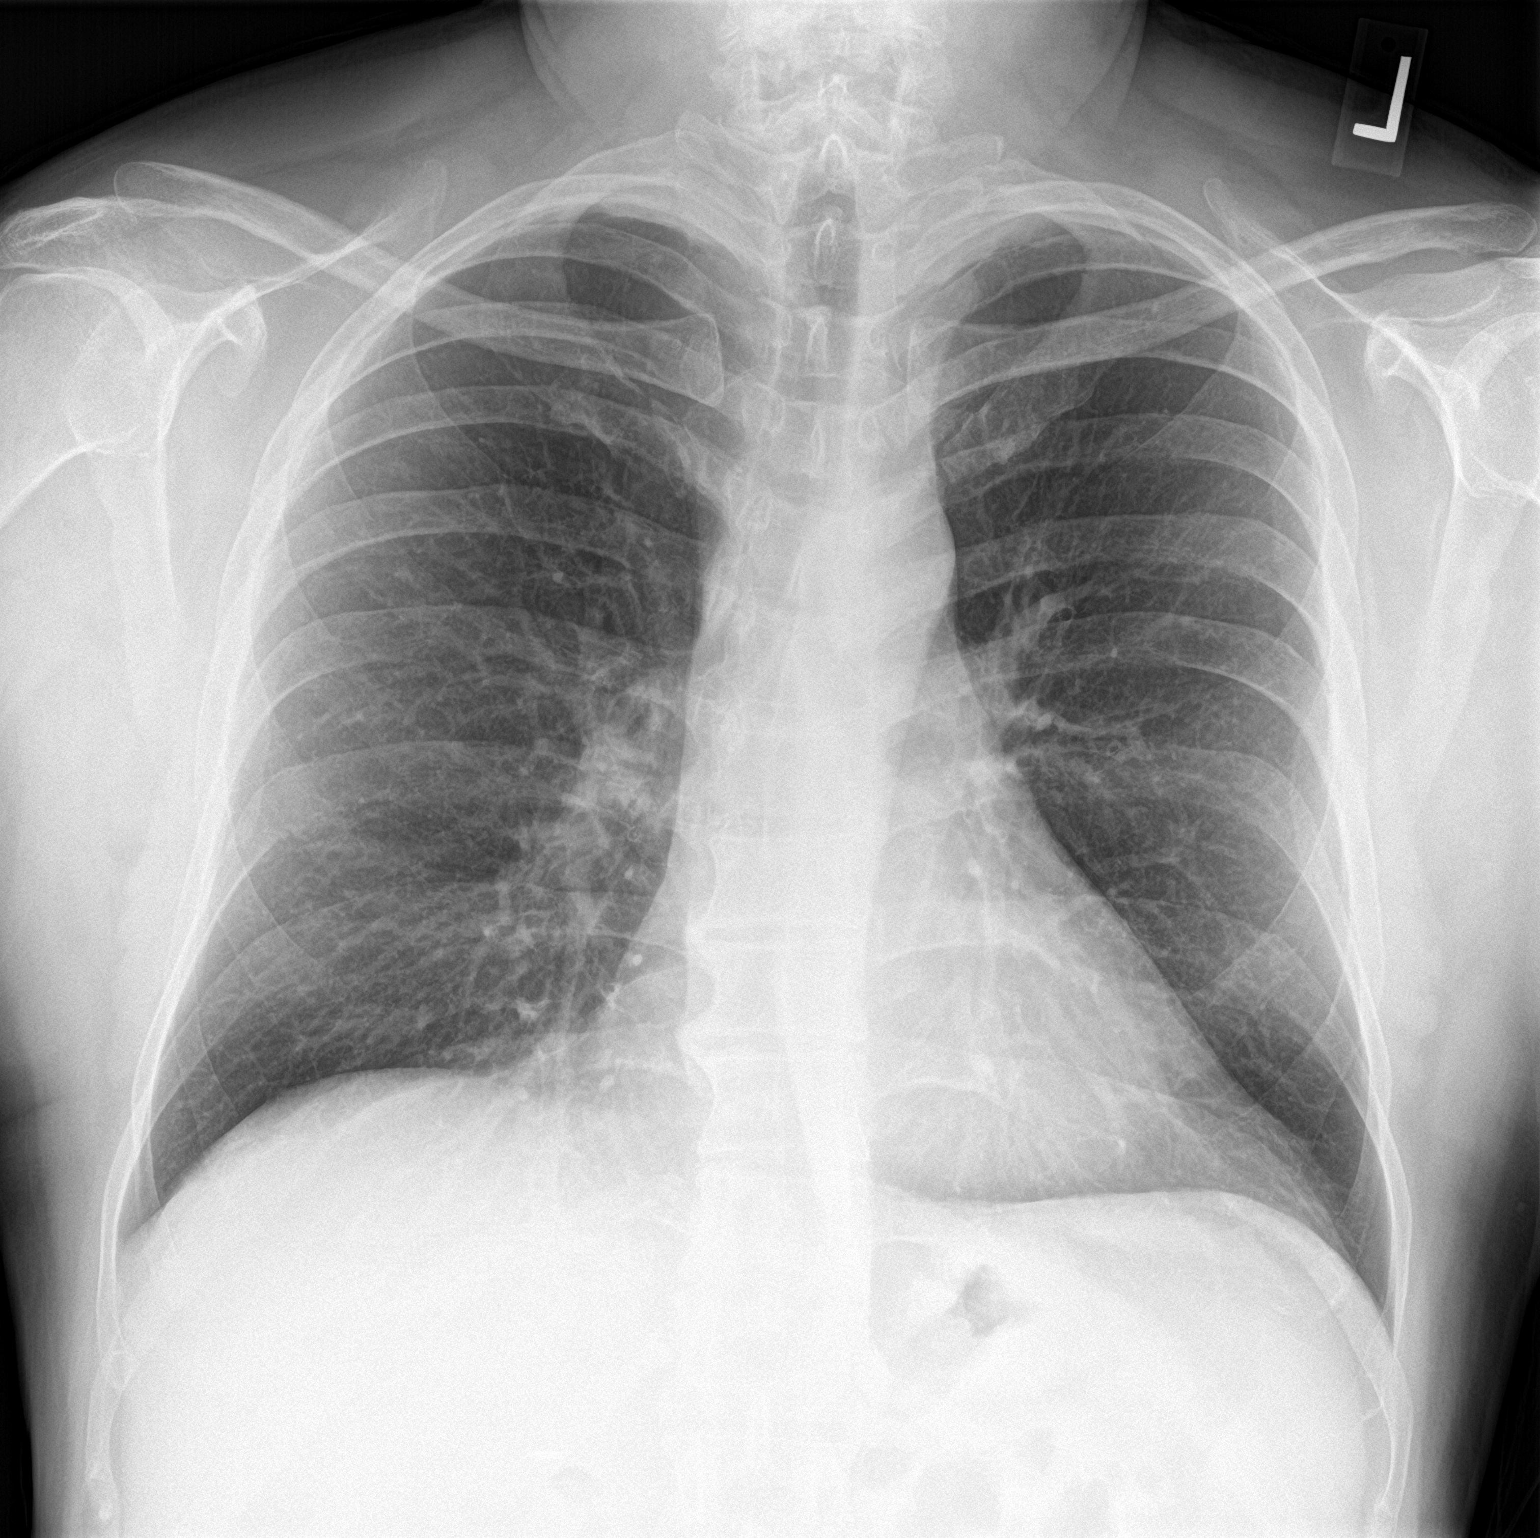

[chest lat]
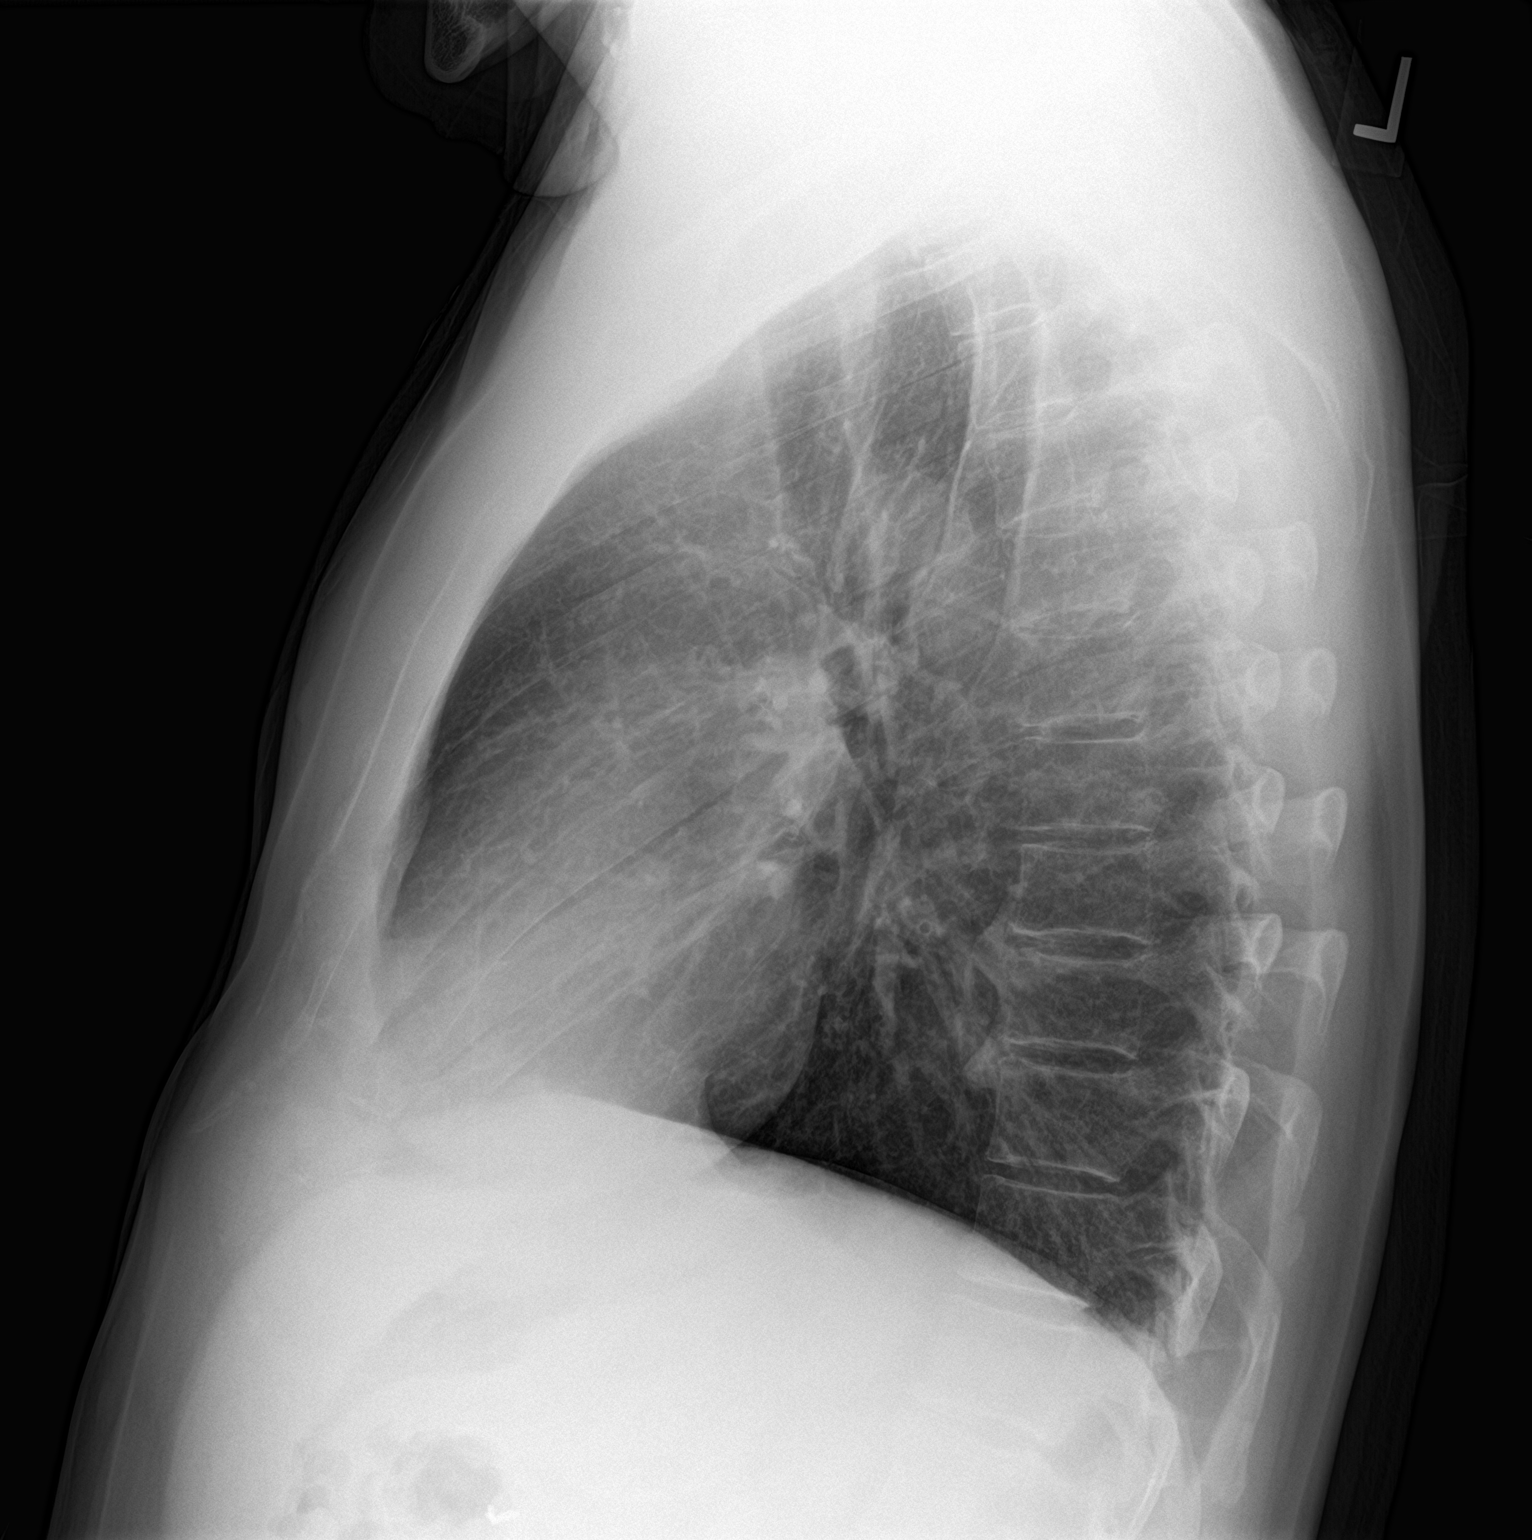

[2 of 2 positions shown; findings below may reference images not displayed]

FINDINGS: There is no evidence of acute infiltrate, pleural effusion or
pneumothorax. The heart size and mediastinal contours are within
normal limits. The visualized skeletal structures are unremarkable.
Radiopaque surgical clips are seen within the right upper quadrant.
IMPRESSION: No active cardiopulmonary disease.

## 2022-03-08 MED ORDER — PROMETHAZINE-DM 6.25-15 MG/5ML PO SYRP: 5.0000 mL | ORAL_SOLUTION | Freq: Four times a day (QID) | ORAL | 0 refills | Status: DC | PRN

## 2022-03-08 MED ORDER — MOLNUPIRAVIR EUA 200MG CAPSULE: 4.0000 | ORAL_CAPSULE | Freq: Two times a day (BID) | ORAL | 0 refills | Status: AC

## 2022-03-08 NOTE — Patient Instructions (Signed)
Darrell Santos, thank you for joining Mar Daring, PA-C for today's virtual visit.  While this provider is not your primary care provider (PCP), if your PCP is located in our provider database this encounter information will be shared with them immediately following your visit.   [facility] account gives you access to today's visit and all your visits, tests, and labs performed at [facility] " click here if you don't have a [place] [facility] account or go to [email or web address]  Consent: (Patient) Darrell Santos provided verbal consent for this virtual visit at the beginning of the encounter.  Current Medications:  Current Outpatient Medications:    molnupiravir EUA (LAGEVRIO) 200 mg CAPS capsule, Take 4 capsules (800 mg total) by mouth 2 (two) times daily for 5 days., Disp: 40 capsule, Rfl: 0   promethazine-dextromethorphan (PROMETHAZINE-DM) 6.25-15 MG/5ML syrup, Take 5 mLs by mouth 4 (four) times daily as needed., Disp: 118 mL, Rfl: 0   albuterol (PROAIR HFA) 108 (90 Base) MCG/ACT inhaler, Inhale 2 puffs into the lungs every 4 (four) hours as needed for wheezing or shortness of breath., Disp: 56 g, Rfl: 3   B Complex-Folic Acid (B COMPLEX PLUS) TABS, Take 1 tablet by mouth daily., Disp: 100 tablet, Rfl: 3   Blood Glucose Calibration (OT ULTRA/FASTTK CNTRL SOLN) SOLN, 1 Bottle by Does not apply route as directed., Disp: 2 each, Rfl: 0   clonazePAM (KLONOPIN) 0.5 MG tablet, Take 1 tablet (0.5 mg total) by mouth daily as needed., Disp: 90 tablet, Rfl: 1   diphenhydrAMINE (BENADRYL) 25 MG tablet, Take 2 tablets (50 mg total) by mouth every 4 (four) hours as needed (allergic reaction)., Disp: 100 tablet, Rfl: 0   escitalopram (LEXAPRO) 10 MG tablet, Take 1 tablet (10 mg total) by mouth daily., Disp: 90 tablet, Rfl: 2   esomeprazole (NEXIUM) 40 MG capsule, Take by mouth., Disp: , Rfl:    fenofibrate 160 MG tablet, Take 1 tablet daily, Disp: 90 tablet,  Rfl: 3   fluticasone (FLONASE) 50 MCG/ACT nasal spray, Place 1 spray into both nostrils daily., Disp: 15.8 mL, Rfl: 0   glucose blood (ONETOUCH VERIO) test strip, Use as instructed to test blood sugar 3 times daily E11.9, Disp: 100 each, Rfl: 12   metFORMIN (GLUCOPHAGE) 500 MG tablet, 1 tab every other day, Disp: 90 tablet, Rfl: 3   olmesartan-hydrochlorothiazide (BENICAR HCT) 20-12.5 MG tablet, TAKE 1 TABLET DAILY, Disp: 90 tablet, Rfl: 3   pantoprazole (PROTONIX) 40 MG tablet, Take 1 tablet (40 mg total) by mouth daily., Disp: 90 tablet, Rfl: 2   pseudoephedrine (SM NASAL DECONGESTANT MAX ST) 30 MG tablet, Take 2 tablets (60 mg total) by mouth every 4 (four) hours as needed for congestion (allergic reaction)., Disp: 48 tablet, Rfl: 0   QVAR REDIHALER 80 MCG/ACT inhaler, USE 1 INHALATION TWICE A DAY, Disp: 31.8 g, Rfl: 5   rosuvastatin (CRESTOR) 5 MG tablet, TAKE 1 TABLET EVERY OTHER DAY, Disp: 90 tablet, Rfl: 1   Medications ordered in this encounter:  Meds ordered this encounter  Medications   molnupiravir EUA (LAGEVRIO) 200 mg CAPS capsule    Sig: Take 4 capsules (800 mg total) by mouth 2 (two) times daily for 5 days.    Dispense:  40 capsule    Refill:  0    Order Specific Question:   Supervising Provider    Answer:   Chase Picket [id number]   promethazine-dextromethorphan (PROMETHAZINE-DM) 6.25-15 MG/5ML syrup    Sig:  Take 5 mLs by mouth 4 (four) times daily as needed.    Dispense:  118 mL    Refill:  0    Order Specific Question:   Supervising Provider    Answer:   Chase Picket [id number]     *If you need refills on other medications prior to your next appointment, please contact your pharmacy*  Follow-Up: Call back or seek an in-person evaluation if the symptoms worsen or if the condition fails to improve as anticipated.  [place] [phone]  Care Instructions:  Molnupiravir Capsules What is this medication? MOLNUPIRAVIR (MOL nue PIR a vir)  treats mild to moderate COVID-19. It may help people who are at high risk of developing severe illness. This medication works by limiting the spread of the virus in your body. The FDA has allowed the emergency use of this medication. This medicine may be used for other purposes; ask your health care provider or pharmacist if you have questions. COMMON BRAND NAME(S): LAGEVRIO What should I tell my care team before I take this medication? They need to know if you have any of these conditions: Any allergies Any serious illness An unusual or allergic reaction to molnupiravir, other medications, foods, dyes, or preservatives Pregnant or trying to get pregnant Breast-feeding How should I use this medication? Take this medication by mouth with water. Take it as directed on the prescription label at the same time every day. Do not cut, crush, or chew this medication. Swallow the capsules whole. You can take it with or without food. If it upsets your stomach, take it with food. Take all of it unless your care team tells you to stop it early. Keep taking it even if you think you are better. Talk to your care team about the use of this medication in children. Special care may be needed. Overdosage: If you think you have taken too much of this medicine contact a poison control center or emergency room at once. NOTE: This medicine is only for you. Do not share this medicine with others. What if I miss a dose? If you miss a dose, take it as soon as you can unless it is more than 10 hours late. If it is more than 10 hours late, skip the missed dose. Take the next dose at the normal time. Do not take extra or 2 doses at the same time to make up for the missed dose. What may interact with this medication? Interactions have not been studied. This list may not describe all possible interactions. Give your health care provider a list of all the medicines, herbs, non-prescription drugs, or dietary supplements you use.  Also tell them if you smoke, drink alcohol, or use illegal drugs. Some items may interact with your medicine. What should I watch for while using this medication? Your condition will be monitored carefully while you are receiving this medication. Visit your care team for regular checkups. Tell your care team if your symptoms do not start to get better or if they get worse. Do not become pregnant while taking this medication. You may need a pregnancy test before starting this medication. Women must use a reliable form of birth control while taking this medication and for 4 days after stopping the medication. Women should inform their care team if they wish to become pregnant or think they might be pregnant. Men should not father a child while taking this medication and for 3 months after stopping it. There is  potential for serious harm to an unborn child. Talk to your care team for more information. Do not breast-feed an infant while taking this medication and for 4 days after stopping the medication. What side effects may I notice from receiving this medication? Side effects that you should report to your care team as soon as possible: Allergic reactions--skin rash, itching, hives, swelling of the face, lips, tongue, or throat Side effects that usually do not require medical attention (report these to your care team if they continue or are bothersome): Diarrhea Dizziness Nausea This list may not describe all possible side effects. Call your doctor for medical advice about side effects. You may report side effects to FDA at 1-800-FDA-1088. Where should I keep my medication? Keep out of the reach of children and pets. Store at room temperature between 20 and 25 degrees C (68 and 77 degrees F). Get rid of any unused medication after the expiration date. To get rid of medications that are no longer needed or have expired: Take the medication to a medication take-back program. Check with your pharmacy or  law enforcement to find a location. If you cannot return the medication, check the label or package insert to see if the medication should be thrown out in the garbage or flushed down the toilet. If you are not sure, ask your care team. If it is safe to put it in the trash, take the medication out of the container. Mix the medication with cat litter, dirt, coffee grounds, or other unwanted substance. Seal the mixture in a bag or container. Put it in the trash. NOTE: This sheet is a summary. It may not cover all possible information. If you have questions about this medicine, talk to your doctor, pharmacist, or health care provider.  2023 Elsevier/Gold Standard (2020-01-21 00:00:00)    Isolation Instructions: You are to isolate at home for 5 days from onset of your symptoms. If you must be around other household members who do not have symptoms, you need to make sure that both you and the family members are masking consistently with a high-quality mask.  After day 5 of isolation, if you have had no fever within 24 hours and you are feeling better, you can end isolation but need to mask for an additional 5 days.  After day 5 if you have a fever or are having significant symptoms, please isolate for full 10 days.  If you note any worsening of symptoms despite treatment, please seek an in-person evaluation ASAP. If you note any significant shortness of breath or any chest pain, please seek ER evaluation. Please do not delay care!   COVID-19: What to Do if You Are Sick If you test positive and are an older adult or someone who is at high risk of getting very sick from COVID-19, treatment may be available. Contact a healthcare provider right away after a positive test to determine if you are eligible, even if your symptoms are mild right now. You can also visit a Test to Treat location and, if eligible, receive a prescription from a provider. Don't delay: Treatment must be started within the first few  days to be effective. If you have a fever, cough, or other symptoms, you might have COVID-19. Most people have mild illness and are able to recover at home. If you are sick: Keep track of your symptoms. If you have an emergency warning sign (including trouble breathing), call 911. Steps to help prevent the spread of COVID-19 if you  are sick If you are sick with COVID-19 or think you might have COVID-19, follow the steps below to care for yourself and to help protect other people in your home and community. Stay home except to get medical care Stay home. Most people with COVID-19 have mild illness and can recover at home without medical care. Do not leave your home, except to get medical care. Do not visit public areas and do not go to places where you are unable to wear a mask. Take care of yourself. Get rest and stay hydrated. Take over-the-counter medicines, such as acetaminophen, to help you feel better. Stay in touch with your doctor. Call before you get medical care. Be sure to get care if you have trouble breathing, or have any other emergency warning signs, or if you think it is an emergency. Avoid public transportation, ride-sharing, or taxis if possible. Get tested If you have symptoms of COVID-19, get tested. While waiting for test results, stay away from others, including staying apart from those living in your household. Get tested as soon as possible after your symptoms start. Treatments may be available for people with COVID-19 who are at risk for becoming very sick. Don't delay: Treatment must be started early to be effective--some treatments must begin within 5 days of your first symptoms. Contact your healthcare provider right away if your test result is positive to determine if you are eligible. Self-tests are one of several options for testing for the virus that causes COVID-19 and may be more convenient than laboratory-based tests and point-of-care tests. Ask your healthcare  provider or your local health department if you need help interpreting your test results. You can visit your state, tribal, local, and territorial health department's website to look for the latest local information on testing sites. Separate yourself from other people As much as possible, stay in a specific room and away from other people and pets in your home. If possible, you should use a separate bathroom. If you need to be around other people or animals in or outside of the home, wear a well-fitting mask. Tell your close contacts that they may have been exposed to COVID-19. An infected person can spread COVID-19 starting 48 hours (or 2 days) before the person has any symptoms or tests positive. By letting your close contacts know they may have been exposed to COVID-19, you are helping to protect everyone. See COVID-19 and Animals if you have questions about pets. If you are diagnosed with COVID-19, someone from the health department may call you. Answer the call to slow the spread. Monitor your symptoms Symptoms of COVID-19 include fever, cough, or other symptoms. Follow care instructions from your healthcare provider and local health department. Your local health authorities may give instructions on checking your symptoms and reporting information. When to seek emergency medical attention Look for emergency warning signs* for COVID-19. If someone is showing any of these signs, seek emergency medical care immediately: Trouble breathing Persistent pain or pressure in the chest New confusion Inability to wake or stay awake Pale, gray, or blue-colored skin, lips, or nail beds, depending on skin tone *This list is not all possible symptoms. Please call your medical provider for any other symptoms that are severe or concerning to you. Call 911 or call ahead to your local emergency facility: Notify the operator that you are seeking care for someone who has or may have COVID-19. Call ahead before  visiting your doctor Call ahead. Many medical visits for routine care  are being postponed or done by phone or telemedicine. If you have a medical appointment that cannot be postponed, call your doctor's office, and tell them you have or may have COVID-19. This will help the office protect themselves and other patients. If you are sick, wear a well-fitting mask You should wear a mask if you must be around other people or animals, including pets (even at home). Wear a mask with the best fit, protection, and comfort for you. You don't need to wear the mask if you are alone. If you can't put on a mask (because of trouble breathing, for example), cover your coughs and sneezes in some other way. Try to stay at least 6 feet away from other people. This will help protect the people around you. Masks should not be placed on young children under age 7 years, anyone who has trouble breathing, or anyone who is not able to remove the mask without help. Cover your coughs and sneezes Cover your mouth and nose with a tissue when you cough or sneeze. Throw away used tissues in a lined trash can. Immediately wash your hands with soap and water for at least 20 seconds. If soap and water are not available, clean your hands with an alcohol-based hand sanitizer that contains at least 60% alcohol. Clean your hands often Wash your hands often with soap and water for at least 20 seconds. This is especially important after blowing your nose, coughing, or sneezing; going to the bathroom; and before eating or preparing food. Use hand sanitizer if soap and water are not available. Use an alcohol-based hand sanitizer with at least 60% alcohol, covering all surfaces of your hands and rubbing them together until they feel dry. Soap and water are the best option, especially if hands are visibly dirty. Avoid touching your eyes, nose, and mouth with unwashed hands. Handwashing Tips Avoid sharing personal household items Do not  share dishes, drinking glasses, cups, eating utensils, towels, or bedding with other people in your home. Wash these items thoroughly after using them with soap and water or put in the dishwasher. Clean surfaces in your home regularly Clean and disinfect high-touch surfaces (for example, doorknobs, tables, handles, light switches, and countertops) in your "sick room" and bathroom. In shared spaces, you should clean and disinfect surfaces and items after each use by the person who is ill. If you are sick and cannot clean, a caregiver or other person should only clean and disinfect the area around you (such as your bedroom and bathroom) on an as needed basis. Your caregiver/other person should wait as long as possible (at least several hours) and wear a mask before entering, cleaning, and disinfecting shared spaces that you use. Clean and disinfect areas that may have blood, stool, or body fluids on them. Use household cleaners and disinfectants. Clean visible dirty surfaces with household cleaners containing soap or detergent. Then, use a household disinfectant. Use a product from [employer] List N: Disinfectants for Coronavirus ([id number]). Be sure to follow the instructions on the label to ensure safe and effective use of the product. Many products recommend keeping the surface wet with a disinfectant for a certain period of time (look at "contact time" on the product label). You may also need to wear personal protective equipment, such as gloves, depending on the directions on the product label. Immediately after disinfecting, wash your hands with soap and water for 20 seconds. For completed guidance on cleaning and disinfecting your home, visit Complete Disinfection Guidance. Take  steps to improve ventilation at home Improve ventilation (air flow) at home to help prevent from spreading COVID-19 to other people in your household. Clear out COVID-19 virus particles in the air by opening windows, using air  filters, and turning on fans in your home. Use this interactive tool to learn how to improve air flow in your home. When you can be around others after being sick with COVID-19 Deciding when you can be around others is different for different situations. Find out when you can safely end home isolation. For any additional questions about your care, contact your healthcare provider or state or local health department. 04/15/2020 Content source: [facility] for Immunization and Respiratory Diseases ([facility]), Division of Viral Diseases This information is not intended to replace advice given to you by your health care provider. Make sure you discuss any questions you have with your health care provider. Document Revised: 05/29/2020 Document Reviewed: 05/29/2020 Elsevier Patient Education  2022 [employer].     If you have been instructed to have an in-person evaluation today at a local Urgent Care facility, please use the link below. It will take you to a list of all of our available [place] [facility], including address, phone number and hours of operation. Please do not delay care.  [facility]  If you or a family member do not have a primary care provider, use the link below to schedule a visit and establish care. When you choose a [facility] primary care physician or advanced practice provider, you gain a long-term partner in health. Find a Primary Care Provider  Learn more about [facility]'s in-office and virtual care options: [facility]

## 2022-03-08 NOTE — Progress Notes (Signed)
Virtual Visit Consent   Darrell Santos, you are scheduled for a virtual visit with a [facility] provider today. Just as with appointments in the office, your consent must be obtained to participate. Your consent will be active for this visit and any virtual visit you may have with one of our providers in the next 365 days. If you have a MyChart account, a copy of this consent can be sent to you electronically.  As this is a virtual visit, video technology does not allow for your provider to perform a traditional examination. This may limit your provider's ability to fully assess your condition. If your provider identifies any concerns that need to be evaluated in person or the need to arrange testing (such as labs, EKG, etc.), we will make arrangements to do so. Although advances in technology are sophisticated, we cannot ensure that it will always work on either your end or our end. If the connection with a video visit is poor, the visit may have to be switched to a telephone visit. With either a video or telephone visit, we are not always able to ensure that we have a secure connection.  By engaging in this virtual visit, you consent to the provision of healthcare and authorize for your insurance to be billed (if applicable) for the services provided during this visit. Depending on your insurance coverage, you may receive a charge related to this service.  I need to obtain your verbal consent now. Are you willing to proceed with your visit today? [name] has provided verbal consent on 03/08/2022 for a virtual visit (video or telephone). Mar Daring, PA-C  Date: 03/08/2022 5:13 PM  Virtual Visit via Video Note   I, Mar Daring, connected with  Darrell Santos  ([id number], 1965/10/04) on 03/08/22 at  5:00 PM EST by a video-enabled telemedicine application and verified that I am speaking with the correct person using two identifiers.  Location: Patient: Virtual Visit  Location Patient: Home Provider: Virtual Visit Location Provider: Home Office   I discussed the limitations of evaluation and management by telemedicine and the availability of in person appointments. The patient expressed understanding and agreed to proceed.    History of Present Illness: Darrell Santos is a 57 y.o. who identifies as a male who was assigned male at birth, and is being seen today for Covid 72.  HPI: URI  This is a new problem. The current episode started yesterday (Symptoms started yesterday; Tested positive on at home test yesterday). The problem has been gradually worsening. There has been no fever. Associated symptoms include congestion, coughing, headaches, rhinorrhea, sinus pain and a sore throat. Pertinent negatives include no diarrhea, ear pain, nausea, plugged ear sensation, vomiting or wheezing. He has tried acetaminophen, increased fluids, NSAIDs and decongestant for the symptoms. The treatment provided no relief.      Problems:  Patient Active Problem List   Diagnosis Date Noted   Diabetes mellitus type 2 in obese ([place]) 05/23/2021   Elevated LFTs 02/11/2021   Low serum vitamin B12 11/20/2020   Hypertriglyceridemia 09/05/2020   Lumbar back pain 08/27/2020   Disorder of upper airway 10/12/2019   Type 2 diabetes mellitus without complication, without long-term current use of insulin ([place]) 06/05/2019   Alcohol use 02/21/2019   Fatty liver 10/11/2018   Preop exam for internal medicine 09/01/2017   Foot pain, left 09/02/2016   Acquired trigger finger 03/16/2016   Dyslipidemia 09/10/2015   Restless leg syndrome 09/19/2014  Rash and nonspecific skin eruption 09/19/2014   History of total left hip replacement 08/21/2014   Avascular necrosis of bone of right hip ([place]) 08/05/2014   Inflamed seborrheic keratosis 06/03/2014   Drug reaction 04/02/2014   Itchy skin 04/02/2014   Enthesopathy of hip 01/30/2014   Possible exposure to STD 09/10/2013   Arthralgia  03/05/2013   Stress fracture of metatarsal due to multiple or repetitive stress 03/05/2013   Warts 09/27/2012   Well adult exam 08/16/2012   Actinic keratoses 08/16/2012   Food allergy 08/15/2012   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   Diarrhea 06/30/2010   CHOLELITHIASIS 01/08/2010   RUQ PAIN 12/29/2009   ACTINIC KERATOSIS 07/31/2007   Neoplasm of uncertain behavior of skin 06/21/2007   ABNORMAL GLUCOSE NEC 06/21/2007   ABNORMAL LIVER FUNCTION TESTS 06/21/2007   Anxiety state 11/25/2006   Essential hypertension 11/25/2006   ALLERGIC RHINITIS 11/25/2006   Asthma 11/25/2006   GERD 11/25/2006    Allergies:  Allergies  Allergen Reactions   Midazolam Shortness Of Breath    (Pre-op)- "dificulty breathing with associated heaviness in chest"    Molds & Smuts Shortness Of Breath   Other Anaphylaxis    Cats and nuts   Pollen Extract    Medications:  Current Outpatient Medications:    molnupiravir EUA (LAGEVRIO) 200 mg CAPS capsule, Take 4 capsules (800 mg total) by mouth 2 (two) times daily for 5 days., Disp: 40 capsule, Rfl: 0   promethazine-dextromethorphan (PROMETHAZINE-DM) 6.25-15 MG/5ML syrup, Take 5 mLs by mouth 4 (four) times daily as needed., Disp: 118 mL, Rfl: 0   albuterol (PROAIR HFA) 108 (90 Base) MCG/ACT inhaler, Inhale 2 puffs into the lungs every 4 (four) hours as needed for wheezing or shortness of breath., Disp: 56 g, Rfl: 3   B Complex-Folic Acid (B COMPLEX PLUS) TABS, Take 1 tablet by mouth daily., Disp: 100 tablet, Rfl: 3   Blood Glucose Calibration (OT ULTRA/FASTTK CNTRL SOLN) SOLN, 1 Bottle by Does not apply route as directed., Disp: 2 each, Rfl: 0   clonazePAM (KLONOPIN) 0.5 MG tablet, Take 1 tablet (0.5 mg total) by mouth daily as needed., Disp: 90 tablet, Rfl: 1   diphenhydrAMINE (BENADRYL) 25 MG tablet, Take 2 tablets (50 mg total) by mouth every 4 (four) hours as needed (allergic reaction)., Disp: 100 tablet, Rfl: 0   escitalopram (LEXAPRO) 10 MG tablet,  Take 1 tablet (10 mg total) by mouth daily., Disp: 90 tablet, Rfl: 2   esomeprazole (NEXIUM) 40 MG capsule, Take by mouth., Disp: , Rfl:    fenofibrate 160 MG tablet, Take 1 tablet daily, Disp: 90 tablet, Rfl: 3   fluticasone (FLONASE) 50 MCG/ACT nasal spray, Place 1 spray into both nostrils daily., Disp: 15.8 mL, Rfl: 0   glucose blood (ONETOUCH VERIO) test strip, Use as instructed to test blood sugar 3 times daily E11.9, Disp: 100 each, Rfl: 12   metFORMIN (GLUCOPHAGE) 500 MG tablet, 1 tab every other day, Disp: 90 tablet, Rfl: 3   olmesartan-hydrochlorothiazide (BENICAR HCT) 20-12.5 MG tablet, TAKE 1 TABLET DAILY, Disp: 90 tablet, Rfl: 3   pantoprazole (PROTONIX) 40 MG tablet, Take 1 tablet (40 mg total) by mouth daily., Disp: 90 tablet, Rfl: 2   pseudoephedrine (SM NASAL DECONGESTANT MAX ST) 30 MG tablet, Take 2 tablets (60 mg total) by mouth every 4 (four) hours as needed for congestion (allergic reaction)., Disp: 48 tablet, Rfl: 0   QVAR REDIHALER 80 MCG/ACT inhaler, USE 1 INHALATION TWICE A DAY, Disp: 31.8 g, Rfl:  5   rosuvastatin (CRESTOR) 5 MG tablet, TAKE 1 TABLET EVERY OTHER DAY, Disp: 90 tablet, Rfl: 1  Observations/Objective: Patient is well-developed, well-nourished in no acute distress.  Resting comfortably at home.  Head is normocephalic, atraumatic.  No labored breathing.  Speech is clear and coherent with logical content.  Patient is alert and oriented at baseline.    Assessment and Plan: 1. COVID-19 - molnupiravir EUA (LAGEVRIO) 200 mg CAPS capsule; Take 4 capsules (800 mg total) by mouth 2 (two) times daily for 5 days.  Dispense: 40 capsule; Refill: 0 - promethazine-dextromethorphan (PROMETHAZINE-DM) 6.25-15 MG/5ML syrup; Take 5 mLs by mouth 4 (four) times daily as needed.  Dispense: 118 mL; Refill: 0  - Continue OTC symptomatic management of choice - Will send OTC vitamins and supplement information through AVS - Molnupiravir and Promethazine DM prescribed - Patient  enrolled in MyChart symptom monitoring - Push fluids - Rest as needed - Discussed return precautions and when to seek in-person evaluation, sent via AVS as well   Follow Up Instructions: I discussed the assessment and treatment plan with the patient. The patient was provided an opportunity to ask questions and all were answered. The patient agreed with the plan and demonstrated an understanding of the instructions.  A copy of instructions were sent to the patient via MyChart unless otherwise noted below.    The patient was advised to call back or seek an in-person evaluation if the symptoms worsen or if the condition fails to improve as anticipated.  Time:  I spent 10 minutes with the patient via telehealth technology discussing the above problems/concerns.    Mar Daring, PA-C

## 2022-03-18 DIAGNOSIS — H2513 Age-related nuclear cataract, bilateral: Secondary | ICD-10-CM | POA: Diagnosis not present

## 2022-03-18 DIAGNOSIS — H35412 Lattice degeneration of retina, left eye: Secondary | ICD-10-CM | POA: Diagnosis not present

## 2022-03-18 DIAGNOSIS — H3581 Retinal edema: Secondary | ICD-10-CM | POA: Diagnosis not present

## 2022-03-18 DIAGNOSIS — H35342 Macular cyst, hole, or pseudohole, left eye: Secondary | ICD-10-CM | POA: Diagnosis not present

## 2022-03-18 LAB — HM DIABETES EYE EXAM

## 2022-04-20 ENCOUNTER — Telehealth: Payer: Self-pay

## 2022-04-20 NOTE — Telephone Encounter (Signed)
Patient is calling and wanting to schedule appointment as a f/u, has not been seen since 2022

## 2022-04-22 ENCOUNTER — Ambulatory Visit: Payer: BC Managed Care – PPO | Admitting: Podiatry

## 2022-04-22 ENCOUNTER — Encounter: Payer: Self-pay | Admitting: Podiatry

## 2022-04-22 DIAGNOSIS — M778 Other enthesopathies, not elsewhere classified: Secondary | ICD-10-CM

## 2022-04-22 DIAGNOSIS — D3610 Benign neoplasm of peripheral nerves and autonomic nervous system, unspecified: Secondary | ICD-10-CM

## 2022-04-22 NOTE — Progress Notes (Signed)
  Subjective:  Patient ID: Darrell Santos, male    DOB: 1965-04-05,   MRN: [id number]  Chief Complaint  Patient presents with   Neuroma    Patient came in today for left foot neuroma follow-up, patient is still having pain, rate of pain 7 out of 10. Patient states after walking 30 mins he has pain and numbness, patient was unable to get MRI    57 y.o. 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 Patient relates last injection did not help at all. Relates he never did get the MRI set up. Has continued to have pain after about 30 minutes of walking.  Relates it has not improved at all in the last year and hoping to start the process again and realizes he may need surgery.  Denies any other pedal complaints. Denies n/v/f/c.   Past Medical History:  Diagnosis Date   Allergic rhinitis    Allergy    Anxiety    Asthma    Diabetes mellitus without complication (HCC)    GERD (gastroesophageal reflux disease)    HTN (hypertension)    Hyperlipidemia    borderline   Rosacea     Objective:  Physical Exam: Vascular: DP/PT pulses 2/4 bilateral. CFT <3 seconds. Normal hair growth on digits. No edema.  Skin. No lacerations or abrasions bilateral feet.  Musculoskeletal: MMT 5/5 bilateral lower extremities in DF, PF, Inversion and Eversion. Deceased ROM in DF of ankle joint. Tender to palpation between 2nd and 3rd metatarsals. Palpable mulders click noted.  Neurological: Sensation intact to light touch.   Assessment:   1. Capsulitis of left foot   2. Neuroma       Plan:  Patient was evaluated and treated and all questions answered. Discussed neuroma and etiology with patient as well as capsulitis.  At this point discussed further treatment options and patient opted for  further imaging for possible surgery  MRI ordered.   Discussed supportive shoes and recommendations given.  Patient to follow-up after MRI      Lorenda Peck, DPM

## 2022-04-27 ENCOUNTER — Other Ambulatory Visit: Payer: Self-pay | Admitting: Internal Medicine

## 2022-05-05 ENCOUNTER — Encounter: Payer: Self-pay | Admitting: Gastroenterology

## 2022-05-05 ENCOUNTER — Encounter: Payer: Self-pay | Admitting: Podiatry

## 2022-05-10 ENCOUNTER — Other Ambulatory Visit: Payer: BC Managed Care – PPO

## 2022-05-15 ENCOUNTER — Ambulatory Visit
Admission: RE | Admit: 2022-05-15 | Discharge: 2022-05-15 | Disposition: A | Discharge status: Home / Self Care | Payer: BC Managed Care – PPO | Source: Ambulatory Visit | Attending: Podiatry | Admitting: Podiatry

## 2022-05-15 DIAGNOSIS — M778 Other enthesopathies, not elsewhere classified: Secondary | ICD-10-CM

## 2022-05-15 DIAGNOSIS — D3610 Benign neoplasm of peripheral nerves and autonomic nervous system, unspecified: Secondary | ICD-10-CM

## 2022-05-15 DIAGNOSIS — M79672 Pain in left foot: Secondary | ICD-10-CM | POA: Diagnosis not present

## 2022-05-17 DIAGNOSIS — H35412 Lattice degeneration of retina, left eye: Secondary | ICD-10-CM | POA: Diagnosis not present

## 2022-05-17 DIAGNOSIS — H35342 Macular cyst, hole, or pseudohole, left eye: Secondary | ICD-10-CM | POA: Diagnosis not present

## 2022-05-17 DIAGNOSIS — H3581 Retinal edema: Secondary | ICD-10-CM | POA: Diagnosis not present

## 2022-05-17 DIAGNOSIS — H2513 Age-related nuclear cataract, bilateral: Secondary | ICD-10-CM | POA: Diagnosis not present

## 2022-05-25 ENCOUNTER — Encounter: Payer: Self-pay | Admitting: Internal Medicine

## 2022-05-25 ENCOUNTER — Ambulatory Visit (INDEPENDENT_AMBULATORY_CARE_PROVIDER_SITE_OTHER): Payer: BC Managed Care – PPO | Admitting: Internal Medicine

## 2022-05-25 VITALS — BP 120/70 | HR 75 | Temp 98.6°F | Ht 79.0 in | Wt 205.0 lb

## 2022-05-25 DIAGNOSIS — R7989 Other specified abnormal findings of blood chemistry: Secondary | ICD-10-CM

## 2022-05-25 DIAGNOSIS — K760 Fatty (change of) liver, not elsewhere classified: Secondary | ICD-10-CM | POA: Diagnosis not present

## 2022-05-25 DIAGNOSIS — I10 Essential (primary) hypertension: Secondary | ICD-10-CM

## 2022-05-25 DIAGNOSIS — E119 Type 2 diabetes mellitus without complications: Secondary | ICD-10-CM

## 2022-05-25 MED ORDER — CLONAZEPAM 0.5 MG PO TABS: 0.5000 mg | ORAL_TABLET | Freq: Every day | ORAL | 1 refills | Status: DC | PRN

## 2022-05-25 NOTE — Progress Notes (Signed)
Subjective:  Patient ID: Darrell Santos, male    DOB: [birth year]  Age: 57 y.o. MRN: [id number]  CC: Follow-up (6 mnth f/u)   HPI Caelum Federici presents for anxiety, GERD, dyslipidemia  Outpatient Medications Prior to Visit  Medication Sig Dispense Refill   albuterol (PROAIR HFA) 108 (90 Base) MCG/ACT inhaler Inhale 2 puffs into the lungs every 4 (four) hours as needed for wheezing or shortness of breath. 56 g 3   B Complex-Folic Acid (B COMPLEX PLUS) TABS Take 1 tablet by mouth daily. 100 tablet 3   beclomethasone (QVAR REDIHALER) 80 MCG/ACT inhaler USE 1 INHALATION TWICE A DAY 31.8 g 2   Blood Glucose Calibration (OT ULTRA/FASTTK CNTRL SOLN) SOLN 1 Bottle by Does not apply route as directed. 2 each 0   diphenhydrAMINE (BENADRYL) 25 MG tablet Take 2 tablets (50 mg total) by mouth every 4 (four) hours as needed (allergic reaction). 100 tablet 0   EPINEPHrine 0.3 mg/0.3 mL IJ SOAJ injection Inject into the muscle.     escitalopram (LEXAPRO) 10 MG tablet Take 1 tablet (10 mg total) by mouth daily. 90 tablet 2   esomeprazole (NEXIUM) 40 MG capsule Take by mouth.     fenofibrate 160 MG tablet Take 1 tablet daily 90 tablet 3   fluticasone (FLONASE) 50 MCG/ACT nasal spray Place 1 spray into both nostrils daily. 15.8 mL 0   glucose blood (ONETOUCH VERIO) test strip Use as instructed to test blood sugar 3 times daily E11.9 100 each 12   metFORMIN (GLUCOPHAGE) 500 MG tablet 1 tab every other day 90 tablet 3   olmesartan-hydrochlorothiazide (BENICAR HCT) 20-12.5 MG tablet TAKE 1 TABLET DAILY 90 tablet 3   pantoprazole (PROTONIX) 40 MG tablet Take 1 tablet (40 mg total) by mouth daily. 90 tablet 2   pseudoephedrine (SM NASAL DECONGESTANT MAX ST) 30 MG tablet Take 2 tablets (60 mg total) by mouth every 4 (four) hours as needed for congestion (allergic reaction). 48 tablet 0   rosuvastatin (CRESTOR) 5 MG tablet TAKE 1 TABLET EVERY OTHER DAY 90 tablet 1   clonazePAM (KLONOPIN) 0.5 MG tablet  Take 1 tablet (0.5 mg total) by mouth daily as needed. 90 tablet 1   promethazine-dextromethorphan (PROMETHAZINE-DM) 6.25-15 MG/5ML syrup Take 5 mLs by mouth 4 (four) times daily as needed. (Patient not taking: Reported on 05/25/2022) 118 mL 0   No facility-administered medications prior to visit.    ROS: Review of Systems  Constitutional:  Negative for appetite change, fatigue and unexpected weight change.  HENT:  Negative for congestion, nosebleeds, sneezing, sore throat and trouble swallowing.   Eyes:  Positive for visual disturbance. Negative for itching.  Respiratory:  Negative for cough.   Cardiovascular:  Negative for chest pain, palpitations and leg swelling.  Gastrointestinal:  Negative for abdominal distention, blood in stool, diarrhea and nausea.  Genitourinary:  Negative for frequency and hematuria.  Musculoskeletal:  Negative for back pain, gait problem, joint swelling and neck pain.  Skin:  Negative for rash.  Neurological:  Negative for dizziness, tremors, speech difficulty and weakness.  Psychiatric/Behavioral:  Negative for agitation, dysphoric mood and sleep disturbance. The patient is not nervous/anxious.     Objective:  BP 120/70 (BP Location: Right Arm, Patient Position: Sitting, Cuff Size: Normal)   Pulse 75   Temp 98.6 F (37 C) (Oral)   Ht 6\' 7"  (2.007 m)   Wt 205 lb (93 kg)   SpO2 98%   BMI 23.09 kg/m   BP  Readings from Last 3 Encounters:  05/25/22 120/70  02/17/22 114/82  11/23/21 126/78    Wt Readings from Last 3 Encounters:  05/25/22 205 lb (93 kg)  02/17/22 209 lb 6.4 oz (95 kg)  11/23/21 208 lb 6.4 oz (94.5 kg)    Physical Exam Constitutional:      General: He is not in acute distress.    Appearance: He is well-developed. He is obese.     Comments: NAD  Eyes:     Conjunctiva/sclera: Conjunctivae normal.     Pupils: Pupils are equal, round, and reactive to light.  Neck:     Thyroid: No thyromegaly.     Vascular: No JVD.   Cardiovascular:     Rate and Rhythm: Normal rate and regular rhythm.     Heart sounds: Normal heart sounds. No murmur heard.    No friction rub. No gallop.  Pulmonary:     Effort: Pulmonary effort is normal. No respiratory distress.     Breath sounds: Normal breath sounds. No wheezing or rales.  Chest:     Chest wall: No tenderness.  Abdominal:     General: Bowel sounds are normal. There is no distension.     Palpations: Abdomen is soft. There is no mass.     Tenderness: There is no abdominal tenderness. There is no guarding or rebound.  Musculoskeletal:        General: No tenderness. Normal range of motion.     Cervical back: Normal range of motion.  Lymphadenopathy:     Cervical: No cervical adenopathy.  Skin:    General: Skin is warm and dry.     Findings: No rash.  Neurological:     Mental Status: He is alert and oriented to person, place, and time.     Cranial Nerves: No cranial nerve deficit.     Motor: No abnormal muscle tone.     Coordination: Coordination normal.     Gait: Gait normal.     Deep Tendon Reflexes: Reflexes are normal and symmetric.  Psychiatric:        Behavior: Behavior normal.        Thought Content: Thought content normal.        Judgment: Judgment normal.     Lab Results  Component Value Date   WBC 6.5 11/23/2021   HGB 13.8 11/23/2021   HCT 41.2 11/23/2021   PLT 186.0 11/23/2021   GLUCOSE 84 11/23/2021   CHOL 175 11/23/2021   TRIG 172.0 (H) 11/23/2021   HDL 58.70 11/23/2021   LDLDIRECT 142.0 08/12/2020   LDLCALC 82 11/23/2021   ALT 48 11/23/2021   AST 64 (H) 11/23/2021   NA 141 11/23/2021   K 3.8 11/23/2021   CL 106 11/23/2021   CREATININE 0.95 11/23/2021   BUN 14 11/23/2021   CO2 25 11/23/2021   TSH 1.06 11/23/2021   PSA 0.56 11/23/2021   HGBA1C 5.8 (A) 02/17/2022   MICROALBUR <0.7 08/11/2021    MR FOOT LEFT WO CONTRAST  Result Date: 05/20/2022 CLINICAL DATA:  Chronic foot pain.  Metatarsalgia. EXAM: MRI OF THE LEFT FOOT  WITHOUT CONTRAST TECHNIQUE: Multiplanar, multisequence MR imaging of the left foot was performed. No intravenous contrast was administered. COMPARISON:  None Available. FINDINGS: Bones/Joint/Cartilage No fracture or dislocation. Normal alignment. No joint effusion. No marrow signal abnormality. Ligaments Collateral ligaments are intact.  Lisfranc ligament is intact. Muscles and Tendons Flexor, peroneal and extensor compartment tendons are intact. Muscles are normal. Soft tissue No fluid collection or  hematoma.  No soft tissue mass. IMPRESSION: 1. No acute injury of the left foot. Electronically Signed   By: Elige Ko M.D.   On: 05/20/2022 10:40    Assessment & Plan:   Problem List Items Addressed This Visit     Essential hypertension    On fenofibrate and Crestor      Relevant Medications   EPINEPHrine 0.3 mg/0.3 mL IJ SOAJ injection   Other Relevant Orders   Comprehensive metabolic panel   CBC with Differential/Platelet   TSH   Lipid panel   Fatty liver    On Metformin      Type 2 diabetes mellitus without complication, without long-term current use of insulin (HCC) - Primary    F/u w/Dr [employer] On Metformin - low dose      Relevant Orders   Comprehensive metabolic panel   CBC with Differential/Platelet   TSH   Lipid panel   Elevated LFTs    Monitor LFT      Relevant Orders   Comprehensive metabolic panel   CBC with Differential/Platelet   TSH   Lipid panel      Meds ordered this encounter  Medications   clonazePAM (KLONOPIN) 0.5 MG tablet    Sig: Take 1 tablet (0.5 mg total) by mouth daily as needed.    Dispense:  90 tablet    Refill:  1      Follow-up: No follow-ups on file.  Sonda Primes, MD

## 2022-05-25 NOTE — Assessment & Plan Note (Addendum)
F/u w/Dr Shamleffer On Metformin - low dose

## 2022-05-25 NOTE — Assessment & Plan Note (Signed)
On fenofibrate and Crestor 

## 2022-05-25 NOTE — Assessment & Plan Note (Signed)
On Metformin 

## 2022-05-25 NOTE — Assessment & Plan Note (Signed)
Monitor LFT

## 2022-05-26 LAB — COMPREHENSIVE METABOLIC PANEL
ALT: 77 U/L — ABNORMAL HIGH (ref 0–53)
AST: 81 U/L — ABNORMAL HIGH (ref 0–37)
Albumin: 4.4 g/dL (ref 3.5–5.2)
Alkaline Phosphatase: 50 U/L (ref 39–117)
BUN: 9 mg/dL (ref 6–23)
CO2: 26 mEq/L (ref 19–32)
Calcium: 9.4 mg/dL (ref 8.4–10.5)
Chloride: 103 mEq/L (ref 96–112)
Creatinine, Ser: 0.9 mg/dL (ref 0.40–1.50)
GFR: 94.86 mL/min (ref >60.00)
Glucose, Bld: 94 mg/dL (ref 70–99)
Potassium: 3.9 mEq/L (ref 3.5–5.1)
Sodium: 139 mEq/L (ref 135–145)
Total Bilirubin: 0.7 mg/dL (ref 0.2–1.2)
Total Protein: 7.4 g/dL (ref 6.0–8.3)

## 2022-05-26 LAB — CBC WITH DIFFERENTIAL/PLATELET
Basophils Absolute: 0.1 10*3/uL (ref 0.0–0.1)
Basophils Relative: 1.1 % (ref 0.0–3.0)
Eosinophils Absolute: 0.4 10*3/uL (ref 0.0–0.7)
Eosinophils Relative: 5.5 % — ABNORMAL HIGH (ref 0.0–5.0)
HCT: 41.5 % (ref 39.0–52.0)
Hemoglobin: 14.3 g/dL (ref 13.0–17.0)
Lymphocytes Relative: 30.8 % (ref 12.0–46.0)
Lymphs Abs: 2 10*3/uL (ref 0.7–4.0)
MCHC: 34.4 g/dL (ref 30.0–36.0)
MCV: 93.6 fl (ref 78.0–100.0)
Monocytes Absolute: 0.6 10*3/uL (ref 0.1–1.0)
Monocytes Relative: 9.6 % (ref 3.0–12.0)
Neutro Abs: 3.4 10*3/uL (ref 1.4–7.7)
Neutrophils Relative %: 53 % (ref 43.0–77.0)
Platelets: 188 10*3/uL (ref 150.0–400.0)
RBC: 4.43 Mil/uL (ref 4.22–5.81)
RDW: 13.2 % (ref 11.5–15.5)
WBC: 6.5 10*3/uL (ref 4.0–10.5)

## 2022-05-26 LAB — LIPID PANEL
Cholesterol: 176 mg/dL (ref 0–200)
HDL: 52.8 mg/dL (ref >39.00)
LDL Cholesterol: 95 mg/dL (ref 0–99)
NonHDL: 122.98
Total CHOL/HDL Ratio: 3
Triglycerides: 138 mg/dL (ref 0.0–149.0)
VLDL: 27.6 mg/dL (ref 0.0–40.0)

## 2022-05-26 LAB — TSH: TSH: 1.17 u[IU]/mL (ref 0.35–5.50)

## 2022-05-31 ENCOUNTER — Encounter: Payer: Self-pay | Admitting: Internal Medicine

## 2022-06-04 DIAGNOSIS — H35342 Macular cyst, hole, or pseudohole, left eye: Secondary | ICD-10-CM | POA: Diagnosis not present

## 2022-06-04 DIAGNOSIS — E119 Type 2 diabetes mellitus without complications: Secondary | ICD-10-CM | POA: Diagnosis not present

## 2022-06-04 DIAGNOSIS — Z7984 Long term (current) use of oral hypoglycemic drugs: Secondary | ICD-10-CM | POA: Diagnosis not present

## 2022-06-05 DIAGNOSIS — H35342 Macular cyst, hole, or pseudohole, left eye: Secondary | ICD-10-CM | POA: Diagnosis not present

## 2022-06-10 DIAGNOSIS — H35412 Lattice degeneration of retina, left eye: Secondary | ICD-10-CM | POA: Diagnosis not present

## 2022-06-10 DIAGNOSIS — H2513 Age-related nuclear cataract, bilateral: Secondary | ICD-10-CM | POA: Diagnosis not present

## 2022-06-10 DIAGNOSIS — H35342 Macular cyst, hole, or pseudohole, left eye: Secondary | ICD-10-CM | POA: Diagnosis not present

## 2022-06-10 DIAGNOSIS — H3581 Retinal edema: Secondary | ICD-10-CM | POA: Diagnosis not present

## 2022-07-05 DIAGNOSIS — H35412 Lattice degeneration of retina, left eye: Secondary | ICD-10-CM | POA: Diagnosis not present

## 2022-07-05 DIAGNOSIS — H3581 Retinal edema: Secondary | ICD-10-CM | POA: Diagnosis not present

## 2022-07-05 DIAGNOSIS — H35342 Macular cyst, hole, or pseudohole, left eye: Secondary | ICD-10-CM | POA: Diagnosis not present

## 2022-07-05 DIAGNOSIS — H2513 Age-related nuclear cataract, bilateral: Secondary | ICD-10-CM | POA: Diagnosis not present

## 2022-07-27 ENCOUNTER — Encounter: Payer: Self-pay | Admitting: Internal Medicine

## 2022-07-27 DIAGNOSIS — E119 Type 2 diabetes mellitus without complications: Secondary | ICD-10-CM

## 2022-07-27 MED ORDER — FENOFIBRATE 160 MG PO TABS
ORAL_TABLET | ORAL | 0 refills | Status: DC
Start: 2022-07-27 — End: 2022-08-30

## 2022-08-20 ENCOUNTER — Encounter: Payer: Self-pay | Admitting: Internal Medicine

## 2022-08-20 ENCOUNTER — Ambulatory Visit: Payer: BC Managed Care – PPO | Admitting: Internal Medicine

## 2022-08-20 VITALS — BP 124/70 | HR 76 | Ht 79.0 in | Wt 207.0 lb

## 2022-08-20 DIAGNOSIS — E119 Type 2 diabetes mellitus without complications: Secondary | ICD-10-CM | POA: Diagnosis not present

## 2022-08-20 DIAGNOSIS — K760 Fatty (change of) liver, not elsewhere classified: Secondary | ICD-10-CM

## 2022-08-20 DIAGNOSIS — E785 Hyperlipidemia, unspecified: Secondary | ICD-10-CM

## 2022-08-20 DIAGNOSIS — R7989 Other specified abnormal findings of blood chemistry: Secondary | ICD-10-CM

## 2022-08-20 DIAGNOSIS — Z7984 Long term (current) use of oral hypoglycemic drugs: Secondary | ICD-10-CM

## 2022-08-20 LAB — POCT GLYCOSYLATED HEMOGLOBIN (HGB A1C): Hemoglobin A1C: 5.9 % — AB (ref 4.0–5.6)

## 2022-08-20 NOTE — Progress Notes (Unsigned)
Name: Darrell Santos  Age/ Sex: 57 y.o., male   MRN/ DOB: [id number], 1965-09-19     PCP: Tresa Garter, MD   Reason for Endocrinology Evaluation: Type 2 Diabetes Mellitus  Initial Endocrine Consultative Visit: 03/07/2019    PATIENT IDENTIFIER: [name] is a 57 y.o. male with a past medical history of HTN and T2DM. The patient has followed with Endocrinology clinic since 03/07/2019 for consultative assistance with management of his diabetes.       DIABETIC HISTORY:  Darrell Santos was diagnosed with T2DM in 01/2019, he presented to the ED with symptomatic hyperglycemia . He was discharged on Metformin. He was on Repaglinide for a short period of time but was discontinued in 02/2019 due to hypoglycemia.His hemoglobin A1c has ranged from 5.6% in 05/2019, peaking at 9.4% in 02/2019.   Rosuvastatin started 07/2020  SUBJECTIVE:   During the last visit (02/10/2021): A1c 5.5 % , we continued Metformin         Today (08/20/2022): [name] is here for a follow up on diabetes management.  He checks his blood sugars 1 times weekly. The patient has not had hypoglycemic episodes since the last clinic visit.   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 , requiring sx 5/5th   HOME ENDOCRINE  REGIMEN:  Metformin 500 mg  half a tab every other day  Rosuvastatin 5 mg every other day  Fenofibrate 160 mg daily     Statin:yes  ACE-I/ARB: yes    METER DOWNLOAD SUMMARY:Unable to download  107-136  mg/dL     DIABETIC COMPLICATIONS: Microvascular complications:    Denies: CKD, retinopathy, neuropathy Last eye exam: Completed 07/05/2022   Macrovascular complications:    Denies: CAD, PVD, CVA    HISTORY:  Past Medical History:  Past Medical History:  Diagnosis Date   Allergic rhinitis    Allergy    Anxiety    Asthma    Diabetes mellitus without  complication (HCC)    GERD (gastroesophageal reflux disease)    HTN (hypertension)    Hyperlipidemia    borderline   Rosacea    Past Surgical History:  Past Surgical History:  Procedure Laterality Date   CHOLECYSTECTOMY     COLONOSCOPY     NASAL SINUS SURGERY     TOTAL HIP ARTHROPLASTY     Social History:  reports that he has never smoked. He quit smokeless tobacco use about 25 years ago. He reports current alcohol use of about 30.0 standard drinks of alcohol per week. He reports that he does not use drugs. Family History:  Family History  Problem Relation Age of Onset   Hypertension Other    Hypertension Other    Diabetes Other        1st degree relative    Asthma Mother    Hypertension Father    Diabetes Father    Colon polyps Neg Hx    Colon cancer Neg Hx    Esophageal cancer Neg Hx    Rectal cancer Neg Hx    Stomach cancer Neg Hx      HOME MEDICATIONS: Allergies as of 08/20/2022       Reactions   Midazolam Shortness Of Breath   (Pre-op)- "dificulty breathing with associated heaviness in chest"    Molds & Smuts Shortness Of Breath   Other Anaphylaxis   Cats and nuts  Dog Epithelium Itching   Pollen Extract         Medication List        Accurate as of August 20, 2022  3:04 PM. If you have any questions, ask your nurse or doctor.          albuterol 108 (90 Base) MCG/ACT inhaler Commonly known as: ProAir HFA Inhale 2 puffs into the lungs every 4 (four) hours as needed for wheezing or shortness of breath.   B Complex Plus Tabs Take 1 tablet by mouth daily.   Banophen 25 MG tablet Generic drug: diphenhydrAMINE Take 2 tablets (50 mg total) by mouth every 4 (four) hours as needed (allergic reaction).   clonazePAM 0.5 MG tablet Commonly known as: KLONOPIN Take 1 tablet (0.5 mg total) by mouth daily as needed.   EPINEPHrine 0.3 mg/0.3 mL Soaj injection Commonly known as: EPI-PEN Inject into the muscle.   escitalopram 10 MG tablet Commonly known  as: LEXAPRO Take 1 tablet (10 mg total) by mouth daily.   esomeprazole 40 MG capsule Commonly known as: NEXIUM Take by mouth.   fenofibrate 160 MG tablet Take 1 tablet daily   fluticasone 50 MCG/ACT nasal spray Commonly known as: FLONASE Place 1 spray into both nostrils daily.   metFORMIN 500 MG tablet Commonly known as: GLUCOPHAGE 1 tab every other day What changed: additional instructions   olmesartan-hydrochlorothiazide 20-12.5 MG tablet Commonly known as: BENICAR HCT TAKE 1 TABLET DAILY   OneTouch Verio test strip Generic drug: glucose blood Use as instructed to test blood sugar 3 times daily E11.9   OT ULTRA/FASTTK CNTRL SOLN Soln 1 Bottle by Does not apply route as directed.   pantoprazole 40 MG tablet Commonly known as: PROTONIX Take 1 tablet (40 mg total) by mouth daily.   promethazine-dextromethorphan 6.25-15 MG/5ML syrup Commonly known as: PROMETHAZINE-DM Take 5 mLs by mouth 4 (four) times daily as needed.   Qvar RediHaler 80 MCG/ACT inhaler Generic drug: beclomethasone USE 1 INHALATION TWICE A DAY   rosuvastatin 5 MG tablet Commonly known as: CRESTOR TAKE 1 TABLET EVERY OTHER DAY   SM Nasal Decongestant Max St 30 MG tablet Generic drug: pseudoephedrine Take 2 tablets (60 mg total) by mouth every 4 (four) hours as needed for congestion (allergic reaction).         OBJECTIVE:   Vital Signs: BP 124/70 (BP Location: Left Arm, Patient Position: Sitting, Cuff Size: Small)   Pulse 76   Ht 6\' 7"  (2.007 m)   Wt 207 lb (93.9 kg)   SpO2 98%   BMI 23.32 kg/m   Wt Readings from Last 3 Encounters:  08/20/22 207 lb (93.9 kg)  05/25/22 205 lb (93 kg)  02/17/22 209 lb 6.4 oz (95 kg)     Exam: General: Pt appears well and is in NAD  Lungs: Clear to auscultation    Heart: RRR   Abdomen: soft, nontender, without masses or organomegaly palpable  Extremities: No pretibial edema.  Neuro: MS is good with appropriate affect, pt is alert and Ox3         DM foot exam: 08/20/2022   The skin of the feet is intact without sores or ulcerations, pt with callous formation at the medial aspect of the 1st MT joint The pedal pulses are 1+ on right and 1+ on left. The sensation is intact to a screening 5.07, 10 gram monofilament bilaterally       DATA REVIEWED:  Lab Results  Component Value Date   HGBA1C  5.8 (A) 02/17/2022   HGBA1C 6.1 11/23/2021   HGBA1C 5.6 08/11/2021    Latest Reference Range & Units 08/20/22 15:39  Sodium 134 - 144 mmol/L 142  Potassium 3.5 - 5.2 mmol/L 4.1  Chloride 96 - 106 mmol/L 106  CO2 20 - 29 mmol/L 22  Glucose 70 - 99 mg/dL 98  BUN 6 - 24 mg/dL 8  Creatinine 4.13 - 2.44 mg/dL 0.10  Calcium 8.7 - 27.2 mg/dL 9.4  BUN/Creatinine Ratio 9 - 20  9  eGFR >59 mL/min/1.73 100  Alkaline Phosphatase 44 - 121 IU/L 60  Albumin 3.8 - 4.9 g/dL 4.7  AST 0 - 40 IU/L 79 (H)  ALT 0 - 44 IU/L 58 (H)  Total Protein 6.0 - 8.5 g/dL 6.9  Total Bilirubin 0.0 - 1.2 mg/dL 0.6  GGT 0 - 65 IU/L 536 (H)    Latest Reference Range & Units 08/20/22 15:39  Globulin, Total 1.5 - 4.5 g/dL 2.2  Platelets [id number] CANCELED      ASSESSMENT / PLAN / RECOMMENDATIONS:   1) Type 2 Diabetes Mellitus, Optimally controlled, Without complications - Most recent A1c of 5.9 %. Goal A1c < 7.0%.    -A1c continues to be at goal -Patient would like to remain on metformin     MEDICATIONS: Metformin 500 mg , half a tablet every other day  EDUCATION / INSTRUCTIONS: BG monitoring instructions: Patient is instructed to check his blood sugars 3 times a week . Call  Endocrinology clinic if: BG persistently < 70  I reviewed the Rule of 15 for the treatment of hypoglycemia in detail with the patient. Literature supplied.     2) Diabetic complications:  Eye: Does not have known diabetic retinopathy. Up to date  Neuro/ Feet: Does not have known diabetic peripheral neuropathy. Renal: Patient does not have known baseline CKD. He is on an  ACEI/ARB at present.     3) Dyslipidemia:     -LDL and TG stable    Medication  Continue  Rosuvastatin 5 mg every other day  Continue Fenofibrate 160 mg daily     4) Elevated LFT's/Fatty Liver:   -Patient with history of LFT elevation while on statin therapy, abdominal ultrasound from 2011 revealed steatohepatitis -He is already working on decreasing alcohol consumption -Repeat LFTs show improvement, GGT extremely elevated, unfortunately we were unable to proceed with fib 4  -Will proceed with repeat liver ultrasound, and have a low threshold for GI referral      F/U in 6 months     Signed electronically by: Lyndle Herrlich, MD  [facility] Endocrinology  [facility] [address] [place] [place], [place] [address] Phone: [phone] FAX: [phone]   CC: Tresa Garter, MD [address] [place] [address] Phone: [phone]  Fax: [phone]  Return to Endocrinology clinic as below: Future Appointments  Date Time Provider Department Center  11/24/2022  3:00 PM Plotnikov, Georgina Quint, MD [facility] None

## 2022-08-25 ENCOUNTER — Ambulatory Visit (INDEPENDENT_AMBULATORY_CARE_PROVIDER_SITE_OTHER): Payer: BC Managed Care – PPO

## 2022-08-25 ENCOUNTER — Encounter: Payer: Self-pay | Admitting: Internal Medicine

## 2022-08-25 DIAGNOSIS — R161 Splenomegaly, not elsewhere classified: Secondary | ICD-10-CM | POA: Diagnosis not present

## 2022-08-25 DIAGNOSIS — K760 Fatty (change of) liver, not elsewhere classified: Secondary | ICD-10-CM | POA: Diagnosis not present

## 2022-08-25 DIAGNOSIS — R7989 Other specified abnormal findings of blood chemistry: Secondary | ICD-10-CM

## 2022-08-25 DIAGNOSIS — Z9049 Acquired absence of other specified parts of digestive tract: Secondary | ICD-10-CM | POA: Diagnosis not present

## 2022-08-27 ENCOUNTER — Telehealth: Payer: Self-pay | Admitting: *Deleted

## 2022-08-27 NOTE — Telephone Encounter (Signed)
Rec'd fax pt requesting renewal on his clonazepam..

## 2022-08-30 ENCOUNTER — Encounter: Payer: Self-pay | Admitting: Internal Medicine

## 2022-08-30 ENCOUNTER — Other Ambulatory Visit: Payer: Self-pay

## 2022-08-30 DIAGNOSIS — E119 Type 2 diabetes mellitus without complications: Secondary | ICD-10-CM

## 2022-08-30 MED ORDER — FENOFIBRATE 160 MG PO TABS
ORAL_TABLET | ORAL | 0 refills | Status: DC
Start: 2022-08-30 — End: 2023-02-04

## 2022-08-31 ENCOUNTER — Encounter: Payer: Self-pay | Admitting: Internal Medicine

## 2022-08-31 ENCOUNTER — Telehealth: Payer: Self-pay | Admitting: Internal Medicine

## 2022-08-31 DIAGNOSIS — K7581 Nonalcoholic steatohepatitis (NASH): Secondary | ICD-10-CM

## 2022-08-31 MED ORDER — CLONAZEPAM 0.5 MG PO TABS: 0.5000 mg | ORAL_TABLET | Freq: Every day | ORAL | 1 refills | Status: DC | PRN

## 2022-08-31 MED ORDER — SEMAGLUTIDE(0.25 OR 0.5MG/DOS) 2 MG/3ML ~~LOC~~ SOPN: 0.5000 mg | PEN_INJECTOR | SUBCUTANEOUS | 3 refills | Status: DC

## 2022-08-31 NOTE — Telephone Encounter (Signed)
Attempted to call the patient 08/31/2022 at 1430 to discuss ultrasound results

## 2022-08-31 NOTE — Telephone Encounter (Signed)
Discussed liver ultrasound with the patient on 08/31/2018 for    Discussed questionable hepatic and renal cyst, we will proceed with MRI of the abdomen  Discussed evidence of fatty liver as well as cirrhosis and splenomegaly  GI referral has been placed  Will stop metformin and start Ozempic due to evidence of GLP 1 agonist in reducing the risk of cirrhosis and hepatocellular carcinoma   Patient will be scheduled for nurse visit for training

## 2022-08-31 NOTE — Telephone Encounter (Signed)
Okay. Thank you.

## 2022-09-01 ENCOUNTER — Ambulatory Visit: Payer: BC Managed Care – PPO

## 2022-09-01 ENCOUNTER — Encounter: Payer: Self-pay | Admitting: Internal Medicine

## 2022-09-01 ENCOUNTER — Telehealth: Payer: Self-pay

## 2022-09-01 NOTE — Telephone Encounter (Signed)
Patient was shown how to administer Ozempic. Patient verbalized understanding

## 2022-09-07 ENCOUNTER — Ambulatory Visit: Payer: BC Managed Care – PPO

## 2022-09-07 ENCOUNTER — Encounter: Payer: Self-pay | Admitting: Internal Medicine

## 2022-09-07 ENCOUNTER — Other Ambulatory Visit: Payer: Self-pay

## 2022-09-07 DIAGNOSIS — R161 Splenomegaly, not elsewhere classified: Secondary | ICD-10-CM | POA: Diagnosis not present

## 2022-09-07 DIAGNOSIS — K760 Fatty (change of) liver, not elsewhere classified: Secondary | ICD-10-CM | POA: Diagnosis not present

## 2022-09-07 DIAGNOSIS — I10 Essential (primary) hypertension: Secondary | ICD-10-CM

## 2022-09-07 DIAGNOSIS — K7581 Nonalcoholic steatohepatitis (NASH): Secondary | ICD-10-CM

## 2022-09-07 DIAGNOSIS — K7689 Other specified diseases of liver: Secondary | ICD-10-CM | POA: Diagnosis not present

## 2022-09-07 DIAGNOSIS — K7460 Unspecified cirrhosis of liver: Secondary | ICD-10-CM | POA: Diagnosis not present

## 2022-09-07 MED ORDER — OLMESARTAN MEDOXOMIL-HCTZ 20-12.5 MG PO TABS
1.0000 | ORAL_TABLET | Freq: Every day | ORAL | 3 refills | Status: DC
Start: 2022-09-07 — End: 2022-12-01

## 2022-09-07 MED ORDER — GADOBUTROL 1 MMOL/ML IV SOLN
10.0000 mL | Freq: Once | INTRAVENOUS | Status: AC | PRN
  Administered 2022-09-07: 10 mL via INTRAVENOUS

## 2022-09-15 ENCOUNTER — Encounter: Payer: Self-pay | Admitting: Internal Medicine

## 2022-09-15 DIAGNOSIS — K7581 Nonalcoholic steatohepatitis (NASH): Secondary | ICD-10-CM

## 2022-09-20 DIAGNOSIS — H3581 Retinal edema: Secondary | ICD-10-CM | POA: Diagnosis not present

## 2022-09-20 DIAGNOSIS — H35412 Lattice degeneration of retina, left eye: Secondary | ICD-10-CM | POA: Diagnosis not present

## 2022-09-20 DIAGNOSIS — H35342 Macular cyst, hole, or pseudohole, left eye: Secondary | ICD-10-CM | POA: Diagnosis not present

## 2022-09-20 DIAGNOSIS — H2513 Age-related nuclear cataract, bilateral: Secondary | ICD-10-CM | POA: Diagnosis not present

## 2022-10-05 DIAGNOSIS — H25812 Combined forms of age-related cataract, left eye: Secondary | ICD-10-CM | POA: Diagnosis not present

## 2022-10-22 DIAGNOSIS — H25812 Combined forms of age-related cataract, left eye: Secondary | ICD-10-CM | POA: Diagnosis not present

## 2022-11-01 ENCOUNTER — Encounter: Payer: Self-pay | Admitting: Internal Medicine

## 2022-11-03 ENCOUNTER — Other Ambulatory Visit (HOSPITAL_BASED_OUTPATIENT_CLINIC_OR_DEPARTMENT_OTHER): Payer: Self-pay

## 2022-11-03 ENCOUNTER — Other Ambulatory Visit: Payer: Self-pay | Admitting: Internal Medicine

## 2022-11-03 MED ORDER — INFLUENZA VIRUS VACC SPLIT PF (FLUZONE) 0.5 ML IM SUSY
0.5000 mL | PREFILLED_SYRINGE | Freq: Once | INTRAMUSCULAR | 0 refills | Status: AC
  Filled 2022-11-03: qty 0.5, 1d supply, fill #0

## 2022-11-24 ENCOUNTER — Ambulatory Visit: Payer: BC Managed Care – PPO | Admitting: Internal Medicine

## 2022-12-01 ENCOUNTER — Encounter: Payer: Self-pay | Admitting: Internal Medicine

## 2022-12-01 ENCOUNTER — Ambulatory Visit: Payer: BC Managed Care – PPO | Admitting: Internal Medicine

## 2022-12-01 VITALS — BP 120/78 | HR 67 | Temp 98.3°F | Ht 79.0 in | Wt 206.0 lb

## 2022-12-01 DIAGNOSIS — I10 Essential (primary) hypertension: Secondary | ICD-10-CM | POA: Diagnosis not present

## 2022-12-01 DIAGNOSIS — J4520 Mild intermittent asthma, uncomplicated: Secondary | ICD-10-CM

## 2022-12-01 DIAGNOSIS — K7030 Alcoholic cirrhosis of liver without ascites: Secondary | ICD-10-CM | POA: Diagnosis not present

## 2022-12-01 DIAGNOSIS — Z23 Encounter for immunization: Secondary | ICD-10-CM

## 2022-12-01 DIAGNOSIS — Z789 Other specified health status: Secondary | ICD-10-CM

## 2022-12-01 DIAGNOSIS — R161 Splenomegaly, not elsewhere classified: Secondary | ICD-10-CM

## 2022-12-01 DIAGNOSIS — K760 Fatty (change of) liver, not elsewhere classified: Secondary | ICD-10-CM

## 2022-12-01 MED ORDER — ASMANEX (60 METERED DOSES) 220 MCG/ACT IN AEPB
1.0000 | INHALATION_SPRAY | Freq: Two times a day (BID) | RESPIRATORY_TRACT | 3 refills | Status: AC
Start: 2022-12-01 — End: ?

## 2022-12-01 MED ORDER — ESCITALOPRAM OXALATE 10 MG PO TABS: 10.0000 mg | ORAL_TABLET | Freq: Every day | ORAL | 3 refills | Status: DC

## 2022-12-01 MED ORDER — OLMESARTAN MEDOXOMIL-HCTZ 20-12.5 MG PO TABS: 1.0000 | ORAL_TABLET | Freq: Every day | ORAL | 3 refills | Status: DC

## 2022-12-01 MED ORDER — PANTOPRAZOLE SODIUM 40 MG PO TBEC: 40.0000 mg | DELAYED_RELEASE_TABLET | Freq: Every day | ORAL | 3 refills | Status: DC

## 2022-12-01 NOTE — Progress Notes (Signed)
Subjective:  Patient ID: Darrell Santos, male    DOB: 05-27-1965  Age: 57 y.o. MRN: [id number]  CC: Medical Management of Chronic Issues (6 mnth f/u, Pt states his beclomethasone (QVAR REDIHALER) 80 MCG/ACT inhaler needs to be sent in to his local pharmacy.)   HPI Darrell Santos presents for fatty liver/early cirrhosis - MRI Rosanne Ashing stopped drinking - more . F/u on asthma   Outpatient Medications Prior to Visit  Medication Sig Dispense Refill   albuterol (PROAIR HFA) 108 (90 Base) MCG/ACT inhaler Inhale 2 puffs into the lungs every 4 (four) hours as needed for wheezing or shortness of breath. 56 g 3   B Complex-Folic Acid (B COMPLEX PLUS) TABS Take 1 tablet by mouth daily. 100 tablet 3   beclomethasone (QVAR REDIHALER) 80 MCG/ACT inhaler USE 1 INHALATION TWICE A DAY (Patient taking differently: Inhale 1 puff into the lungs 2 (two) times daily.) 31.8 g 2   Blood Glucose Calibration (OT ULTRA/FASTTK CNTRL SOLN) SOLN 1 Bottle by Does not apply route as directed. 2 each 0   clonazePAM (KLONOPIN) 0.5 MG tablet Take 1 tablet (0.5 mg total) by mouth daily as needed. 90 tablet 1   diphenhydrAMINE (BENADRYL) 25 MG tablet Take 2 tablets (50 mg total) by mouth every 4 (four) hours as needed (allergic reaction). 100 tablet 0   EPINEPHrine 0.3 mg/0.3 mL IJ SOAJ injection Inject into the muscle.     fenofibrate 160 MG tablet Take 1 tablet daily 14 tablet 0   fluticasone (FLONASE) 50 MCG/ACT nasal spray Place 1 spray into both nostrils daily. 15.8 mL 0   glucose blood (ONETOUCH VERIO) test strip Use as instructed to test blood sugar 3 times daily E11.9 100 each 12   rosuvastatin (CRESTOR) 5 MG tablet TAKE 1 TABLET EVERY OTHER DAY 90 tablet 1   escitalopram (LEXAPRO) 10 MG tablet TAKE 1 TABLET DAILY 90 tablet 3   olmesartan-hydrochlorothiazide (BENICAR HCT) 20-12.5 MG tablet Take 1 tablet by mouth daily. 90 tablet 3   pantoprazole (PROTONIX) 40 MG tablet TAKE 1 TABLET DAILY 90 tablet 3    pseudoephedrine (SM NASAL DECONGESTANT MAX ST) 30 MG tablet Take 2 tablets (60 mg total) by mouth every 4 (four) hours as needed for congestion (allergic reaction). (Patient not taking: Reported on 08/20/2022) 48 tablet 0   esomeprazole (NEXIUM) 40 MG capsule Take by mouth.     promethazine-dextromethorphan (PROMETHAZINE-DM) 6.25-15 MG/5ML syrup Take 5 mLs by mouth 4 (four) times daily as needed. (Patient not taking: Reported on 08/20/2022) 118 mL 0   Semaglutide,0.25 or 0.5MG /DOS, 2 MG/3ML SOPN Inject 0.5 mg into the skin once a week. 9 mL 3   No facility-administered medications prior to visit.    ROS: Review of Systems  Constitutional:  Negative for appetite change, fatigue and unexpected weight change.  HENT:  Negative for congestion, nosebleeds, sneezing, sore throat and trouble swallowing.   Eyes:  Negative for itching and visual disturbance.  Respiratory:  Negative for cough.   Cardiovascular:  Negative for chest pain, palpitations and leg swelling.  Gastrointestinal:  Negative for abdominal distention, blood in stool, diarrhea and nausea.  Genitourinary:  Negative for frequency and hematuria.  Musculoskeletal:  Negative for back pain, gait problem, joint swelling and neck pain.  Skin:  Negative for rash.  Neurological:  Negative for dizziness, tremors, speech difficulty and weakness.  Psychiatric/Behavioral:  Negative for agitation, dysphoric mood and sleep disturbance. The patient is not nervous/anxious.     Objective:  BP  120/78 (BP Location: Right Arm, Patient Position: Sitting, Cuff Size: Normal)   Pulse 67   Temp 98.3 F (36.8 C) (Oral)   Ht 6\' 7"  (2.007 m)   Wt 206 lb (93.4 kg)   SpO2 97%   BMI 23.21 kg/m   BP Readings from Last 3 Encounters:  12/01/22 120/78  08/20/22 124/70  05/25/22 120/70    Wt Readings from Last 3 Encounters:  12/01/22 206 lb (93.4 kg)  08/20/22 207 lb (93.9 kg)  05/25/22 205 lb (93 kg)    Physical Exam  Lab Results  Component Value  Date   WBC 6.5 05/25/2022   HGB 14.3 05/25/2022   HCT 41.5 05/25/2022   PLT CANCELED 08/20/2022   GLUCOSE 98 08/20/2022   CHOL 176 05/25/2022   TRIG 138.0 05/25/2022   HDL 52.80 05/25/2022   LDLDIRECT 142.0 08/12/2020   LDLCALC 95 05/25/2022   ALT 58 (H) 08/20/2022   AST 79 (H) 08/20/2022   NA 142 08/20/2022   K 4.1 08/20/2022   CL 106 08/20/2022   CREATININE 0.88 08/20/2022   BUN 8 08/20/2022   CO2 22 08/20/2022   TSH 1.17 05/25/2022   PSA 0.56 11/23/2021   HGBA1C 5.9 (A) 08/20/2022   MICROALBUR <0.7 08/11/2021    MR FOOT LEFT WO CONTRAST  Result Date: 05/20/2022 CLINICAL DATA:  Chronic foot pain.  Metatarsalgia. EXAM: MRI OF THE LEFT FOOT WITHOUT CONTRAST TECHNIQUE: Multiplanar, multisequence MR imaging of the left foot was performed. No intravenous contrast was administered. COMPARISON:  None Available. FINDINGS: Bones/Joint/Cartilage No fracture or dislocation. Normal alignment. No joint effusion. No marrow signal abnormality. Ligaments Collateral ligaments are intact.  Lisfranc ligament is intact. Muscles and Tendons Flexor, peroneal and extensor compartment tendons are intact. Muscles are normal. Soft tissue No fluid collection or hematoma.  No soft tissue mass. IMPRESSION: 1. No acute injury of the left foot. Electronically Signed   By: Elige Ko M.D.   On: 05/20/2022 10:40    Assessment & Plan:   Problem List Items Addressed This Visit     Essential hypertension    On Crestor      Relevant Medications   olmesartan-hydrochlorothiazide (BENICAR HCT) 20-12.5 MG tablet   Asthma    Continue with QVAR daily Use Proair HFA prn      Relevant Medications   mometasone (ASMANEX, 60 METERED DOSES,) 220 MCG/ACT inhaler   Fatty liver    08/2022 abdominal MRI IMPRESSION: 1. Multiple fluid signal cysts, including of the anterior left lobe liver corresponding to prior ultrasound finding. No suspicious liver lesion or contrast enhancement. No further follow-up  or characterization is required. 2. Mild cirrhosis and hepatic steatosis. 3. Mild splenomegaly. No ascites. 4. Status post cholecystectomy. 5. Multiple benign fluid signal renal cysts, including of the midportion of the left kidney corresponding to prior ultrasound finding. No further follow-up or characterization is required.  Mild cirrhosis.  Krimson has stopped drinking.  Eating well      Alcohol use    08/2022 abdominal MRI IMPRESSION: 1. Multiple fluid signal cysts, including of the anterior left lobe liver corresponding to prior ultrasound finding. No suspicious liver lesion or contrast enhancement. No further follow-up or characterization is required. 2. Mild cirrhosis and hepatic steatosis. 3. Mild splenomegaly. No ascites. 4. Status post cholecystectomy. 5. Multiple benign fluid signal renal cysts, including of the midportion of the left kidney corresponding to prior ultrasound finding. No further follow-up or characterization is required.  Mild cirrhosis.  Oshea has stopped drinking.  Eating well      Liver cirrhosis (HCC)    New  08/2022 abdominal MRI IMPRESSION: 1. Multiple fluid signal cysts, including of the anterior left lobe liver corresponding to prior ultrasound finding. No suspicious liver lesion or contrast enhancement. No further follow-up or characterization is required. 2. Mild cirrhosis and hepatic steatosis. 3. Mild splenomegaly. No ascites. 4. Status post cholecystectomy. 5. Multiple benign fluid signal renal cysts, including of the midportion of the left kidney corresponding to prior ultrasound finding. No further follow-up or characterization is required.  Mild cirrhosis.  Arturo has stopped drinking.  Eating well      Splenomegaly    New, due to liver cirrhosis 08/2022 abdominal MRI IMPRESSION: 1. Multiple fluid signal cysts, including of the anterior left lobe liver corresponding to prior ultrasound finding. No suspicious liver lesion or  contrast enhancement. No further follow-up or characterization is required. 2. Mild cirrhosis and hepatic steatosis. 3. Mild splenomegaly. No ascites. 4. Status post cholecystectomy. 5. Multiple benign fluid signal renal cysts, including of the midportion of the left kidney corresponding to prior ultrasound finding. No further follow-up or characterization is required.  Mild cirrhosis.  Ata has stopped drinking.  Eating well      Other Visit Diagnoses     Need for shingles vaccine    -  Primary   Relevant Orders   Zoster Recombinant (Shingrix ) (Completed)         Meds ordered this encounter  Medications   pantoprazole (PROTONIX) 40 MG tablet    Sig: Take 1 tablet (40 mg total) by mouth daily.    Dispense:  90 tablet    Refill:  3   escitalopram (LEXAPRO) 10 MG tablet    Sig: Take 1 tablet (10 mg total) by mouth daily.    Dispense:  90 tablet    Refill:  3   olmesartan-hydrochlorothiazide (BENICAR HCT) 20-12.5 MG tablet    Sig: Take 1 tablet by mouth daily.    Dispense:  90 tablet    Refill:  3   mometasone (ASMANEX, 60 METERED DOSES,) 220 MCG/ACT inhaler    Sig: Inhale 1 puff into the lungs 2 (two) times daily.    Dispense:  180 each    Refill:  3      Follow-up: Return in about 6 months (around 05/31/2023) for Wellness Exam.  Sonda Primes, MD

## 2022-12-01 NOTE — Patient Instructions (Signed)
Milk Thistle for the liver  Valerian Root for insomnia

## 2022-12-01 NOTE — Assessment & Plan Note (Signed)
Continue with QVAR daily ?Use Proair HFA prn ?

## 2022-12-07 ENCOUNTER — Encounter: Payer: Self-pay | Admitting: Internal Medicine

## 2022-12-07 DIAGNOSIS — H26492 Other secondary cataract, left eye: Secondary | ICD-10-CM | POA: Diagnosis not present

## 2022-12-07 NOTE — Telephone Encounter (Signed)
Care team updated and letter sent for eye exam notes.

## 2022-12-26 ENCOUNTER — Encounter: Payer: Self-pay | Admitting: Internal Medicine

## 2022-12-26 DIAGNOSIS — K7460 Unspecified cirrhosis of liver: Secondary | ICD-10-CM | POA: Insufficient documentation

## 2022-12-26 DIAGNOSIS — R161 Splenomegaly, not elsewhere classified: Secondary | ICD-10-CM | POA: Insufficient documentation

## 2022-12-26 NOTE — Assessment & Plan Note (Signed)
08/2022 abdominal MRI IMPRESSION: 1. Multiple fluid signal cysts, including of the anterior left lobe liver corresponding to prior ultrasound finding. No suspicious liver lesion or contrast enhancement. No further follow-up or characterization is required. 2. Mild cirrhosis and hepatic steatosis. 3. Mild splenomegaly. No ascites. 4. Status post cholecystectomy. 5. Multiple benign fluid signal renal cysts, including of the midportion of the left kidney corresponding to prior ultrasound finding. No further follow-up or characterization is required.  Mild cirrhosis.  Darrell Santos has stopped drinking.  Eating well

## 2022-12-26 NOTE — Assessment & Plan Note (Signed)
New, due to liver cirrhosis 08/2022 abdominal MRI IMPRESSION: 1. Multiple fluid signal cysts, including of the anterior left lobe liver corresponding to prior ultrasound finding. No suspicious liver lesion or contrast enhancement. No further follow-up or characterization is required. 2. Mild cirrhosis and hepatic steatosis. 3. Mild splenomegaly. No ascites. 4. Status post cholecystectomy. 5. Multiple benign fluid signal renal cysts, including of the midportion of the left kidney corresponding to prior ultrasound finding. No further follow-up or characterization is required.  Mild cirrhosis.  Darrell Santos has stopped drinking.  Eating well

## 2022-12-26 NOTE — Assessment & Plan Note (Addendum)
New  08/2022 abdominal MRI IMPRESSION: 1. Multiple fluid signal cysts, including of the anterior left lobe liver corresponding to prior ultrasound finding. No suspicious liver lesion or contrast enhancement. No further follow-up or characterization is required. 2. Mild cirrhosis and hepatic steatosis. 3. Mild splenomegaly. No ascites. 4. Status post cholecystectomy. 5. Multiple benign fluid signal renal cysts, including of the midportion of the left kidney corresponding to prior ultrasound finding. No further follow-up or characterization is required.  Mild cirrhosis.  [name] has stopped drinking.  Eating well

## 2022-12-26 NOTE — Assessment & Plan Note (Signed)
On Crestor 

## 2023-01-03 ENCOUNTER — Other Ambulatory Visit: Payer: Self-pay | Admitting: Nurse Practitioner

## 2023-01-03 DIAGNOSIS — K7469 Other cirrhosis of liver: Secondary | ICD-10-CM | POA: Diagnosis not present

## 2023-01-03 DIAGNOSIS — E782 Mixed hyperlipidemia: Secondary | ICD-10-CM | POA: Diagnosis not present

## 2023-01-03 DIAGNOSIS — R772 Abnormality of alphafetoprotein: Secondary | ICD-10-CM | POA: Diagnosis not present

## 2023-01-03 DIAGNOSIS — K7581 Nonalcoholic steatohepatitis (NASH): Secondary | ICD-10-CM | POA: Diagnosis not present

## 2023-01-13 ENCOUNTER — Ambulatory Visit
Admission: RE | Admit: 2023-01-13 | Discharge: 2023-01-13 | Disposition: A | Discharge status: Home / Self Care | Payer: BC Managed Care – PPO | Source: Ambulatory Visit | Attending: Nurse Practitioner | Admitting: Nurse Practitioner

## 2023-01-13 ENCOUNTER — Encounter: Payer: Self-pay | Admitting: Internal Medicine

## 2023-01-13 DIAGNOSIS — K7469 Other cirrhosis of liver: Secondary | ICD-10-CM

## 2023-01-13 DIAGNOSIS — K7581 Nonalcoholic steatohepatitis (NASH): Secondary | ICD-10-CM

## 2023-01-13 DIAGNOSIS — K7460 Unspecified cirrhosis of liver: Secondary | ICD-10-CM | POA: Diagnosis not present

## 2023-02-03 ENCOUNTER — Other Ambulatory Visit: Payer: Self-pay | Admitting: Internal Medicine

## 2023-02-03 DIAGNOSIS — E119 Type 2 diabetes mellitus without complications: Secondary | ICD-10-CM

## 2023-02-11 ENCOUNTER — Other Ambulatory Visit: Payer: Self-pay | Admitting: Internal Medicine

## 2023-02-15 IMAGING — DX DG FOOT COMPLETE 3+V*L*
3 series · 3 of 3 positions shown · non-contrast
Comparison: None.

CLINICAL DATA: Left foot pain. Patient is diabetic with history of
capsulitis left foot.

EXAM:
LEFT FOOT - COMPLETE 3+ VIEW

[foot ap]
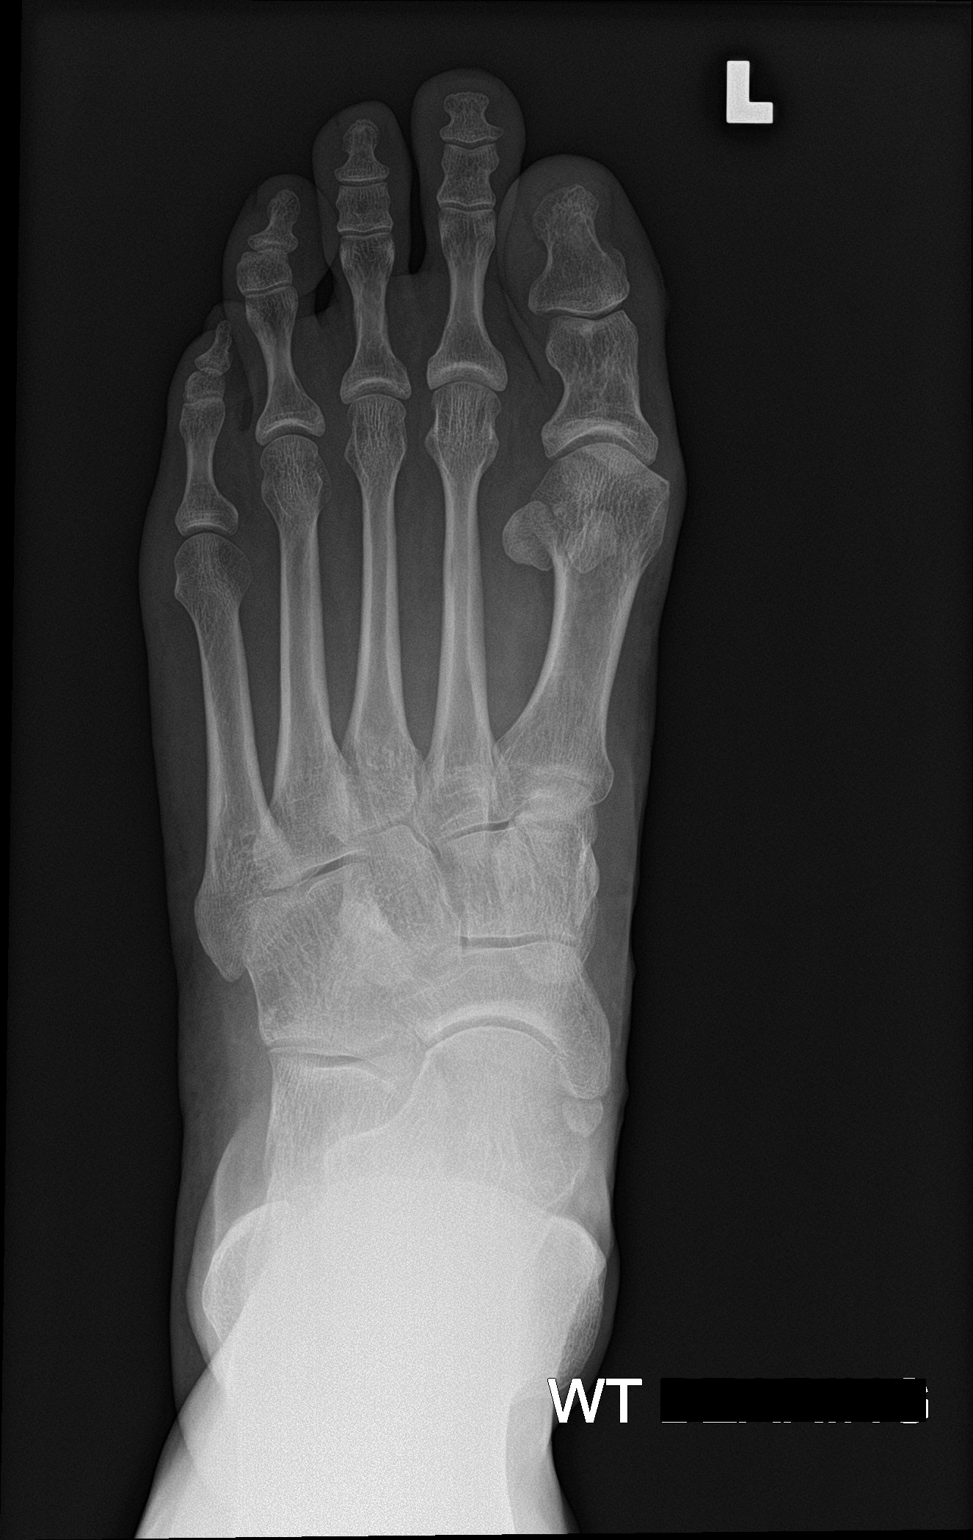

[foot obl]
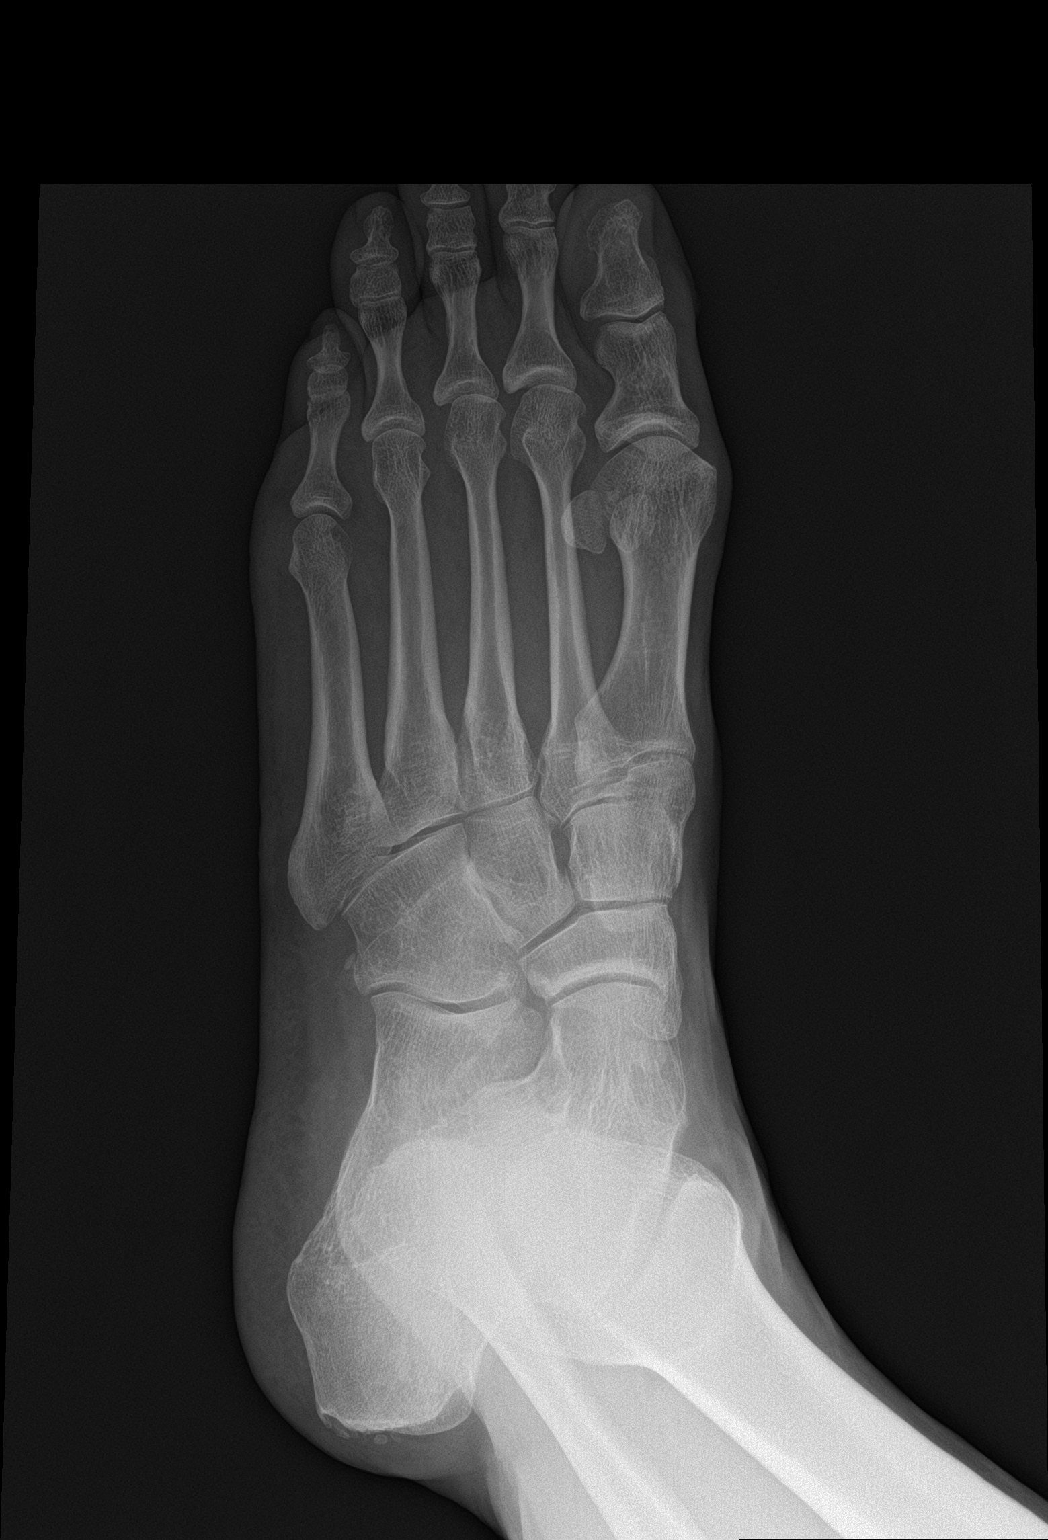

[foot lat]
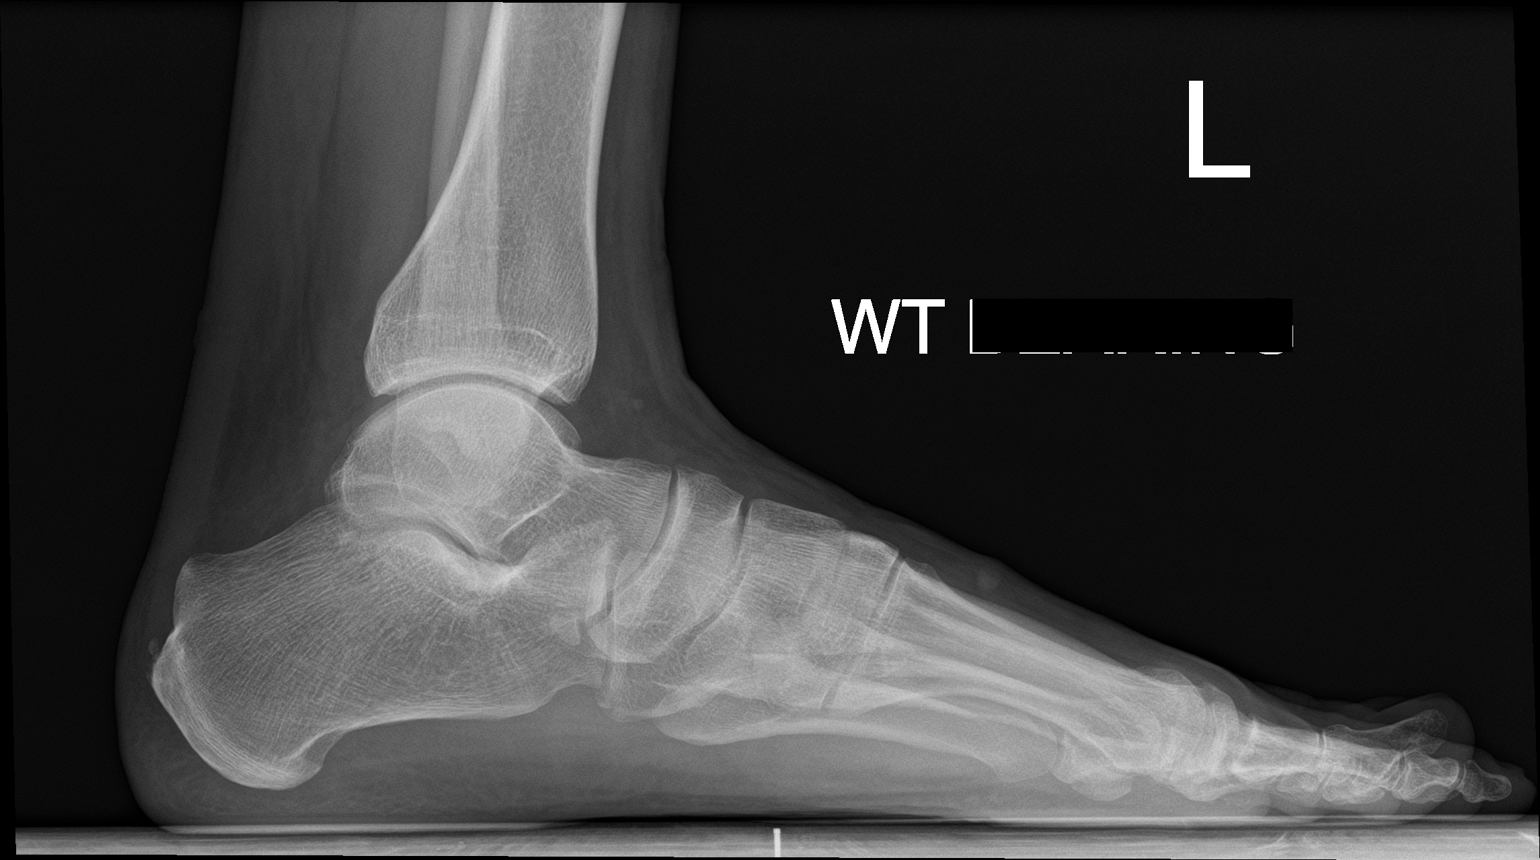

[3 of 3 positions shown; findings below may reference images not displayed]

FINDINGS: Examination demonstrates no significant degenerative changes. No
evidence of bone destruction. Mild foreshortening of the first
metatarsal. No acute fracture or dislocation.
IMPRESSION: No acute findings.

## 2023-02-23 ENCOUNTER — Encounter: Payer: Self-pay | Admitting: Internal Medicine

## 2023-02-23 ENCOUNTER — Ambulatory Visit: Payer: BC Managed Care – PPO | Admitting: Internal Medicine

## 2023-02-23 VITALS — BP 136/80 | HR 78 | Ht 79.0 in | Wt 214.0 lb

## 2023-02-23 DIAGNOSIS — E119 Type 2 diabetes mellitus without complications: Secondary | ICD-10-CM | POA: Diagnosis not present

## 2023-02-23 DIAGNOSIS — K7581 Nonalcoholic steatohepatitis (NASH): Secondary | ICD-10-CM

## 2023-02-23 DIAGNOSIS — E785 Hyperlipidemia, unspecified: Secondary | ICD-10-CM

## 2023-02-23 LAB — POCT GLYCOSYLATED HEMOGLOBIN (HGB A1C): Hemoglobin A1C: 5.6 % (ref 4.0–5.6)

## 2023-02-23 MED ORDER — PRECISION XTRA-GLUCOSE/KETONE DEVI: 1.0000 | Freq: Every day | 0 refills | Status: AC

## 2023-02-23 MED ORDER — PRECISION XTRA BLOOD GLUCOSE VI STRP: 1.0000 | ORAL_STRIP | Freq: Every day | 12 refills | Status: DC

## 2023-02-23 NOTE — Patient Instructions (Signed)
I have sent a prescription of Glucose meter ( Precision Xtra )

## 2023-02-23 NOTE — Progress Notes (Signed)
Name: Darrell Santos  Age/ Sex: 58 y.o., male   MRN/ DOB: [id number], 04-Mar-1965     PCP: Tresa Garter, MD   Reason for Endocrinology Evaluation: Type 2 Diabetes Mellitus  Initial Endocrine Consultative Visit: 03/07/2019    PATIENT IDENTIFIER: Darrell Santos is a 58 y.o. male with a past medical history of HTN and T2DM. The patient has followed with Endocrinology clinic since 03/07/2019 for consultative assistance with management of his diabetes.    Ozempic caused lightheadednesshypoglycemia 10/2022   DIABETIC HISTORY:  Darrell Santos was diagnosed with T2DM in 01/2019, he presented to the ED with symptomatic hyperglycemia . He was discharged on Metformin. He was on Repaglinide for a short period of time but was discontinued in 02/2019 due to hypoglycemia.His hemoglobin A1c has ranged from 5.6% in 05/2019, peaking at 9.4% in 02/2019.   Rosuvastatin started 07/2020  SUBJECTIVE:   During the last visit (02/10/2021): A1c 5.9 %    Today (02/23/2023): Darrell Santos is here for a follow up on diabetes management.  He checks his blood sugars occasionally. The patient has had hypoglycemic episodes since the last clinic visit, he is symptomatic with these episodes, but he also endorses symptoms in the low 70s.  These have occurred after eating, specifically lunch/salads  Pt follows with [facility] for metabolic dysfunction -associated steatohepatitis (MASH)  Denies nausea, vomiting  Denies constipation or diarrhea    HOME ENDOCRINE  REGIMEN:  Fenofibrate 160 mg daily     Statin:yes  ACE-I/ARB: yes    METER DOWNLOAD SUMMARY:Unable to download  49-160 mg/dL     DIABETIC COMPLICATIONS: Microvascular complications:    Denies: CKD, retinopathy, neuropathy Last eye exam: Completed 07/05/2022   Macrovascular complications:    Denies: CAD, PVD, CVA    HISTORY:  Past Medical History:  Past Medical History:  Diagnosis Date   Allergic rhinitis     Allergy    Anxiety    Asthma    Diabetes mellitus without complication (HCC)    GERD (gastroesophageal reflux disease)    HTN (hypertension)    Hyperlipidemia    borderline   Rosacea    Past Surgical History:  Past Surgical History:  Procedure Laterality Date   CHOLECYSTECTOMY     COLONOSCOPY     NASAL SINUS SURGERY     TOTAL HIP ARTHROPLASTY     Social History:  reports that he has never smoked. He quit smokeless tobacco use about 25 years ago. He reports current alcohol use of about 30.0 standard drinks of alcohol per week. He reports that he does not use drugs. Family History:  Family History  Problem Relation Age of Onset   Hypertension Other    Hypertension Other    Diabetes Other        1st degree relative    Asthma Mother    Hypertension Father    Diabetes Father    Colon polyps Neg Hx    Colon cancer Neg Hx    Esophageal cancer Neg Hx    Rectal cancer Neg Hx    Stomach cancer Neg Hx      HOME MEDICATIONS: Allergies as of 02/23/2023       Reactions   Midazolam Shortness Of Breath   (Pre-op)- "dificulty breathing with associated heaviness in chest"    Molds & Smuts Shortness Of Breath   Other Anaphylaxis   Cats and nuts   Dog Epithelium Itching   Pollen Extract  Medication List        Accurate as of February 23, 2023  3:41 PM. If you have any questions, ask your nurse or doctor.          albuterol 108 (90 Base) MCG/ACT inhaler Commonly known as: ProAir HFA Inhale 2 puffs into the lungs every 4 (four) hours as needed for wheezing or shortness of breath.   Asmanex (60 Metered Doses) 220 MCG/ACT inhaler Generic drug: mometasone Inhale 1 puff into the lungs 2 (two) times daily.   B Complex Plus Tabs Take 1 tablet by mouth daily.   Banophen 25 MG tablet Generic drug: diphenhydrAMINE Take 2 tablets (50 mg total) by mouth every 4 (four) hours as needed (allergic reaction).   clonazePAM 0.5 MG tablet Commonly known as: KLONOPIN TAKE 1  TABLET DAILY AS NEEDED   EPINEPHrine 0.3 mg/0.3 mL Soaj injection Commonly known as: EPI-PEN Inject into the muscle.   escitalopram 10 MG tablet Commonly known as: LEXAPRO Take 1 tablet (10 mg total) by mouth daily.   fenofibrate 160 MG tablet TAKE 1 TABLET DAILY   fluticasone 50 MCG/ACT nasal spray Commonly known as: FLONASE Place 1 spray into both nostrils daily.   olmesartan-hydrochlorothiazide 20-12.5 MG tablet Commonly known as: BENICAR HCT Take 1 tablet by mouth daily.   OT ULTRA/FASTTK CNTRL SOLN Soln 1 Bottle by Does not apply route as directed.   pantoprazole 40 MG tablet Commonly known as: PROTONIX Take 1 tablet (40 mg total) by mouth daily.   PRECISION XTRA TEST STRIPS test strip Generic drug: glucose blood 1 each by Other route daily in the afternoon. Use as instructed What changed:  how much to take how to take this when to take this additional instructions Changed by: Johnney Ou Jabree Pernice   Precision Xtra-Glucose/Ketone Devi 1 Device by Does not apply route daily in the afternoon. Started by: Johnney Ou Izik Bingman   Qvar RediHaler 80 MCG/ACT inhaler Generic drug: beclomethasone USE 1 INHALATION TWICE A DAY What changed: See the new instructions.   rosuvastatin 5 MG tablet Commonly known as: CRESTOR TAKE 1 TABLET EVERY OTHER DAY   SM Nasal Decongestant Max St 30 MG tablet Generic drug: pseudoephedrine Take 2 tablets (60 mg total) by mouth every 4 (four) hours as needed for congestion (allergic reaction).   traZODone 50 MG tablet Commonly known as: DESYREL Take 1 tablet by mouth at bedtime.         OBJECTIVE:   Vital Signs: BP 136/80 (BP Location: Left Arm, Patient Position: Sitting, Cuff Size: Small)   Pulse 78   Ht 6\' 7"  (2.007 m)   Wt 214 lb (97.1 kg)   SpO2 96%   BMI 24.11 kg/m   Wt Readings from Last 3 Encounters:  02/23/23 214 lb (97.1 kg)  12/01/22 206 lb (93.4 kg)  08/20/22 207 lb (93.9 kg)     Exam: General: Pt  appears well and is in NAD  Lungs: Clear to auscultation    Heart: RRR   Abdomen: soft, nontender  Extremities: No pretibial edema.  Neuro: MS is good with appropriate affect, pt is alert and Ox3        DM foot exam: 02/23/2023   The skin of the feet is intact without sores or ulcerations, pt with callous formation at the medial aspect of the 1st MT joint The pedal pulses are 1+ on right and 1+ on left. The sensation is intact to a screening 5.07, 10 gram monofilament bilaterally  DATA REVIEWED:  Lab Results  Component Value Date   HGBA1C 5.6 02/23/2023   HGBA1C 5.9 (A) 08/20/2022   HGBA1C 5.8 (A) 02/17/2022    Latest Reference Range & Units 08/20/22 15:39  Sodium 134 - 144 mmol/L 142  Potassium 3.5 - 5.2 mmol/L 4.1  Chloride 96 - 106 mmol/L 106  CO2 20 - 29 mmol/L 22  Glucose 70 - 99 mg/dL 98  BUN 6 - 24 mg/dL 8  Creatinine 2.13 - 0.86 mg/dL 5.78  Calcium 8.7 - 46.9 mg/dL 9.4  BUN/Creatinine Ratio 9 - 20  9  eGFR >59 mL/min/1.73 100  Alkaline Phosphatase 44 - 121 IU/L 60  Albumin 3.8 - 4.9 g/dL 4.7  AST 0 - 40 IU/L 79 (H)  ALT 0 - 44 IU/L 58 (H)  Total Protein 6.0 - 8.5 g/dL 6.9  Total Bilirubin 0.0 - 1.2 mg/dL 0.6  GGT 0 - 65 IU/L 629 (H)    Latest Reference Range & Units 08/20/22 15:39  Globulin, Total 1.5 - 4.5 g/dL 2.2  Platelets [id number]/[id number] CANCELED      ASSESSMENT / PLAN / RECOMMENDATIONS:   1) Type 2 Diabetes Mellitus, Optimally controlled, Without complications - Most recent A1c of 5.6 %. Goal A1c < 7.0%.    -A1c continues to be at goal -He has tried Ozempic but had to discontinue due to hypoglycemia and feeling lightheaded -He is not on metformin -Patient has been noted with low BG's on meter review as low as 49 Mg/DL, I have prescribed a new meter Precision extra, and I have encouraged the patient to use the new meter -Very low risk for insulinoma at this time, pancreatic imaging was unrevealing -We reviewed the technique of fingersticks and to  confirm any abnormal readings immediately with a different finger  EDUCATION / INSTRUCTIONS: BG monitoring instructions: Patient is instructed to check his blood sugars 3 times a week . Call [facility] if: BG persistently < 70  I reviewed the Rule of 15 for the treatment of hypoglycemia in detail with the patient. Literature supplied.     2) Diabetic complications:  Eye: Does not have known diabetic retinopathy. Up to date  Neuro/ Feet: Does not have known diabetic peripheral neuropathy. Renal: Patient does not have known baseline CKD. He is on an ACEI/ARB at present.     3) Dyslipidemia:     -LDL and TG stable - He self discontinued Rosuvastatin     Medication  Continue Fenofibrate 160 mg daily     4) MASH/Cirrosis    -Patient to follow-up with [facility]     F/U in 6 months    I spent 25 minutes preparing to see the patient by review of recent labs, imaging and procedures, obtaining and reviewing separately obtained history, communicating with the patient, ordering medications, tests or procedures, and documenting clinical information in the EHR including the differential Dx, treatment, and any further evaluation and other management   Signed electronically by: Lyndle Herrlich, MD  [facility] Endocrinology  [facility] [address] [place] [place], [place] [address] Phone: [phone] FAX: [phone]   CC: Tresa Garter, MD [address] [place] [address] Phone: [phone]  Fax: [phone]  Return to Endocrinology clinic as below: Future Appointments  Date Time Provider Department Center  03/03/2023  3:00 PM [facility] None  05/31/2023  2:00 PM Plotnikov, Georgina Quint, MD [facility] None  08/23/2023  3:00 PM Vaneza Pickart, Konrad Dolores, MD [facility] None

## 2023-02-25 ENCOUNTER — Encounter: Payer: Self-pay | Admitting: Internal Medicine

## 2023-02-25 DIAGNOSIS — E119 Type 2 diabetes mellitus without complications: Secondary | ICD-10-CM

## 2023-03-03 ENCOUNTER — Ambulatory Visit: Payer: BC Managed Care – PPO

## 2023-03-10 ENCOUNTER — Telehealth: Payer: BC Managed Care – PPO | Admitting: Family Medicine

## 2023-03-10 DIAGNOSIS — J208 Acute bronchitis due to other specified organisms: Secondary | ICD-10-CM | POA: Diagnosis not present

## 2023-03-10 DIAGNOSIS — B9689 Other specified bacterial agents as the cause of diseases classified elsewhere: Secondary | ICD-10-CM

## 2023-03-10 MED ORDER — AZITHROMYCIN 250 MG PO TABS: ORAL_TABLET | ORAL | 0 refills | Status: AC

## 2023-03-10 MED ORDER — BENZONATATE 100 MG PO CAPS: 100.0000 mg | ORAL_CAPSULE | Freq: Three times a day (TID) | ORAL | 0 refills | Status: DC | PRN

## 2023-03-10 NOTE — Progress Notes (Signed)
E-Visit for Cough   We are sorry that you are not feeling well.  Here is how we plan to help!  Based on your presentation I believe you most likely have A cough due to bacteria.  When patients have a fever and a productive cough with a change in color or increased sputum production, we are concerned about bacterial bronchitis.  If left untreated it can progress to pneumonia.  If your symptoms do not improve with your treatment plan it is important that you contact your provider.   I have prescribed Azithromyin 250 mg: two tablets now and then one tablet daily for 4 additonal days    In addition you may use A non-prescription cough medication called Mucinex DM: take 2 tablets every 12 hours. and A prescription cough medication called Tessalon Perles 100mg . You may take 1-2 capsules every 8 hours as needed for your cough.   From your responses in the eVisit questionnaire you describe inflammation in the upper respiratory tract which is causing a significant cough.  This is commonly called Bronchitis and has four common causes:   Allergies Viral Infections Acid Reflux Bacterial Infection Allergies, viruses and acid reflux are treated by controlling symptoms or eliminating the cause. An example might be a cough caused by taking certain blood pressure medications. You stop the cough by changing the medication. Another example might be a cough caused by acid reflux. Controlling the reflux helps control the cough.  USE OF BRONCHODILATOR ("RESCUE") INHALERS: There is a risk from using your bronchodilator too frequently.  The risk is that over-reliance on a medication which only relaxes the muscles surrounding the breathing tubes can reduce the effectiveness of medications prescribed to reduce swelling and congestion of the tubes themselves.  Although you feel brief relief from the bronchodilator inhaler, your asthma may actually be worsening with the tubes becoming more swollen and filled with mucus.  This  can delay other crucial treatments, such as oral steroid medications. If you need to use a bronchodilator inhaler daily, several times per day, you should discuss this with your provider.  There are probably better treatments that could be used to keep your asthma under control.     HOME CARE Only take medications as instructed by your medical team. Complete the entire course of an antibiotic. Drink plenty of fluids and get plenty of rest. Avoid close contacts especially the very young and the elderly Cover your mouth if you cough or cough into your sleeve. Always remember to wash your hands A steam or ultrasonic humidifier can help congestion.   GET HELP RIGHT AWAY IF: You develop worsening fever. You become short of breath You cough up blood. Your symptoms persist after you have completed your treatment plan MAKE SURE YOU  Understand these instructions. Will watch your condition. Will get help right away if you are not doing well or get worse.    Thank you for choosing an e-visit.  Your e-visit answers were reviewed by a board certified advanced clinical practitioner to complete your personal care plan. Depending upon the condition, your plan could have included both over the counter or prescription medications.  Please review your pharmacy choice. Make sure the pharmacy is open so you can pick up prescription now. If there is a problem, you may contact your provider through [employer] and have the prescription routed to another pharmacy.  Your safety is important to [place]. If you have drug allergies check your prescription carefully.   For the next  24 hours you can use MyChart to ask questions about today's visit, request a non-urgent call back, or ask for a work or school excuse. You will get an email in the next two days asking about your experience. I hope that your e-visit has been valuable and will speed your recovery.  I provided 5 minutes of non face-to-face time during  this encounter for chart review, medication and order placement, as well as and documentation.

## 2023-04-15 ENCOUNTER — Ambulatory Visit

## 2023-04-15 DIAGNOSIS — Z23 Encounter for immunization: Secondary | ICD-10-CM

## 2023-04-15 NOTE — Progress Notes (Cosign Needed Addendum)
 Erroneous encounter. No shingle vaccine available pt rescheduled   Medical screening examination/treatment/procedure(s) were performed by non-physician practitioner and as supervising physician I was immediately available for consultation/collaboration.  I agree with above. Jacinta Shoe, MD

## 2023-04-22 ENCOUNTER — Ambulatory Visit

## 2023-04-22 DIAGNOSIS — Z23 Encounter for immunization: Secondary | ICD-10-CM | POA: Diagnosis not present

## 2023-04-22 NOTE — Progress Notes (Signed)
 Pt was given zoster Injection with no complications.

## 2023-04-25 ENCOUNTER — Other Ambulatory Visit: Payer: Self-pay | Admitting: Internal Medicine

## 2023-04-25 DIAGNOSIS — E119 Type 2 diabetes mellitus without complications: Secondary | ICD-10-CM

## 2023-04-26 DIAGNOSIS — H2511 Age-related nuclear cataract, right eye: Secondary | ICD-10-CM | POA: Diagnosis not present

## 2023-05-12 DIAGNOSIS — R1084 Generalized abdominal pain: Secondary | ICD-10-CM | POA: Diagnosis not present

## 2023-05-12 DIAGNOSIS — K922 Gastrointestinal hemorrhage, unspecified: Secondary | ICD-10-CM | POA: Diagnosis not present

## 2023-05-17 DIAGNOSIS — H2511 Age-related nuclear cataract, right eye: Secondary | ICD-10-CM | POA: Diagnosis not present

## 2023-05-31 ENCOUNTER — Encounter: Payer: Self-pay | Admitting: Internal Medicine

## 2023-05-31 ENCOUNTER — Encounter: Payer: BC Managed Care – PPO | Admitting: Internal Medicine

## 2023-06-10 DIAGNOSIS — E1136 Type 2 diabetes mellitus with diabetic cataract: Secondary | ICD-10-CM | POA: Diagnosis not present

## 2023-06-10 DIAGNOSIS — I10 Essential (primary) hypertension: Secondary | ICD-10-CM | POA: Diagnosis not present

## 2023-06-10 DIAGNOSIS — H2511 Age-related nuclear cataract, right eye: Secondary | ICD-10-CM | POA: Diagnosis not present

## 2023-06-10 DIAGNOSIS — H25811 Combined forms of age-related cataract, right eye: Secondary | ICD-10-CM | POA: Diagnosis not present

## 2023-07-05 ENCOUNTER — Other Ambulatory Visit: Payer: Self-pay | Admitting: Nurse Practitioner

## 2023-07-05 DIAGNOSIS — Z148 Genetic carrier of other disease: Secondary | ICD-10-CM | POA: Diagnosis not present

## 2023-07-05 DIAGNOSIS — K7581 Nonalcoholic steatohepatitis (NASH): Secondary | ICD-10-CM

## 2023-07-05 DIAGNOSIS — E7849 Other hyperlipidemia: Secondary | ICD-10-CM | POA: Diagnosis not present

## 2023-07-05 DIAGNOSIS — K7469 Other cirrhosis of liver: Secondary | ICD-10-CM | POA: Diagnosis not present

## 2023-07-06 ENCOUNTER — Ambulatory Visit (INDEPENDENT_AMBULATORY_CARE_PROVIDER_SITE_OTHER): Admitting: Internal Medicine

## 2023-07-06 ENCOUNTER — Other Ambulatory Visit (INDEPENDENT_AMBULATORY_CARE_PROVIDER_SITE_OTHER)

## 2023-07-06 VITALS — BP 124/70 | HR 86 | Temp 98.2°F | Ht 79.0 in

## 2023-07-06 DIAGNOSIS — Z0000 Encounter for general adult medical examination without abnormal findings: Secondary | ICD-10-CM

## 2023-07-06 DIAGNOSIS — E669 Obesity, unspecified: Secondary | ICD-10-CM

## 2023-07-06 DIAGNOSIS — I10 Essential (primary) hypertension: Secondary | ICD-10-CM | POA: Diagnosis not present

## 2023-07-06 DIAGNOSIS — Z7984 Long term (current) use of oral hypoglycemic drugs: Secondary | ICD-10-CM

## 2023-07-06 DIAGNOSIS — F411 Generalized anxiety disorder: Secondary | ICD-10-CM

## 2023-07-06 DIAGNOSIS — E538 Deficiency of other specified B group vitamins: Secondary | ICD-10-CM

## 2023-07-06 DIAGNOSIS — E1165 Type 2 diabetes mellitus with hyperglycemia: Secondary | ICD-10-CM | POA: Diagnosis not present

## 2023-07-06 DIAGNOSIS — L570 Actinic keratosis: Secondary | ICD-10-CM

## 2023-07-06 DIAGNOSIS — K7030 Alcoholic cirrhosis of liver without ascites: Secondary | ICD-10-CM

## 2023-07-06 DIAGNOSIS — E1169 Type 2 diabetes mellitus with other specified complication: Secondary | ICD-10-CM

## 2023-07-06 LAB — URINALYSIS
Bilirubin Urine: NEGATIVE
Hgb urine dipstick: NEGATIVE
Ketones, ur: NEGATIVE
Leukocytes,Ua: NEGATIVE
Nitrite: NEGATIVE
Specific Gravity, Urine: 1.02 (ref 1.000–1.030)
Total Protein, Urine: NEGATIVE
Urine Glucose: NEGATIVE
Urobilinogen, UA: 0.2 (ref 0.0–1.0)
pH: 7 (ref 5.0–8.0)

## 2023-07-06 NOTE — Assessment & Plan Note (Signed)
 Lab Results  Component Value Date   HGBA1C 5.6 02/23/2023  Doing better

## 2023-07-06 NOTE — Assessment & Plan Note (Signed)
Take B complex

## 2023-07-06 NOTE — Assessment & Plan Note (Signed)
 Darrell Santos was seen at [facility] liver clinic on 6/10  08/2022 abdominal MRI IMPRESSION: 1. Multiple fluid signal cysts, including of the anterior left lobe liver corresponding to prior ultrasound finding. No suspicious liver lesion or contrast enhancement. No further follow-up or characterization is required. 2. Mild cirrhosis and hepatic steatosis. 3. Mild splenomegaly. No ascites. 4. Status post cholecystectomy. 5. Multiple benign fluid signal renal cysts, including of the midportion of the left kidney corresponding to prior ultrasound finding. No further follow-up or characterization is required.  Mild cirrhosis.  Darrell Santos has stopped drinking.  Eating well

## 2023-07-06 NOTE — Assessment & Plan Note (Signed)
Clonazepam prn Public speaking anxiety  Potential benefits of a long term benzodiazepines  use as well as potential risks  and complications were explained to the patient and were aknowledged. 

## 2023-07-06 NOTE — Progress Notes (Signed)
 Subjective:  Patient ID: Darrell Santos, male    DOB: 02/11/1965  Age: 58 y.o. MRN: [id number]  CC: Annual Exam (No major issues... Pt states he has some mold on this back he wants looked at.Pt. states had labs done at [facility] yesterday for PCP look at.)   HPI Darrell Santos presents for a well exam  Outpatient Medications Prior to Visit  Medication Sig Dispense Refill   albuterol  (PROAIR  HFA) 108 (90 Base) MCG/ACT inhaler Inhale 2 puffs into the lungs every 4 (four) hours as needed for wheezing or shortness of breath. 56 g 3   B Complex-Folic Acid (B COMPLEX PLUS) TABS Take 1 tablet by mouth daily. 100 tablet 3   beclomethasone (QVAR  REDIHALER) 80 MCG/ACT inhaler USE 1 INHALATION TWICE A DAY (Patient taking differently: Inhale 1 puff into the lungs 2 (two) times daily.) 31.8 g 2   benzonatate  (TESSALON ) 100 MG capsule Take 1 capsule (100 mg total) by mouth 3 (three) times daily as needed for cough. 30 capsule 0   Blood Glucose Calibration (OT ULTRA/FASTTK CNTRL SOLN) SOLN 1 Bottle by Does not apply route as directed. 2 each 0   Blood Glucose/Ketone Monitor (PRECISION XTRA-GLUCOSE/KETONE) DEVI 1 Device by Does not apply route daily in the afternoon. 1 each 0   clonazePAM  (KLONOPIN ) 0.5 MG tablet TAKE 1 TABLET DAILY AS NEEDED 90 tablet 1   diphenhydrAMINE  (BENADRYL ) 25 MG tablet Take 2 tablets (50 mg total) by mouth every 4 (four) hours as needed (allergic reaction). 100 tablet 0   EPINEPHrine  0.3 mg/0.3 mL IJ SOAJ injection Inject into the muscle.     escitalopram  (LEXAPRO ) 10 MG tablet Take 1 tablet (10 mg total) by mouth daily. 90 tablet 3   fenofibrate  160 MG tablet TAKE 1 TABLET DAILY 90 tablet 3   fluticasone  (FLONASE ) 50 MCG/ACT nasal spray Place 1 spray into both nostrils daily. 15.8 mL 0   glucose blood (PRECISION XTRA TEST STRIPS) test strip 1 each by Other route daily in the afternoon. Use as instructed 100 each 12   mometasone (ASMANEX , 60 METERED DOSES,) 220 MCG/ACT  inhaler Inhale 1 puff into the lungs 2 (two) times daily. 180 each 3   olmesartan -hydrochlorothiazide  (BENICAR  HCT) 20-12.5 MG tablet Take 1 tablet by mouth daily. 90 tablet 3   pantoprazole  (PROTONIX ) 40 MG tablet Take 1 tablet (40 mg total) by mouth daily. 90 tablet 3   pseudoephedrine  (SM NASAL DECONGESTANT MAX ST) 30 MG tablet Take 2 tablets (60 mg total) by mouth every 4 (four) hours as needed for congestion (allergic reaction). 48 tablet 0   rosuvastatin  (CRESTOR ) 5 MG tablet TAKE 1 TABLET EVERY OTHER DAY 90 tablet 1   traZODone (DESYREL) 50 MG tablet Take 1 tablet by mouth at bedtime.     No facility-administered medications prior to visit.    ROS: Review of Systems  Constitutional:  Negative for appetite change, fatigue and unexpected weight change.  HENT:  Negative for congestion, nosebleeds, sneezing, sore throat and trouble swallowing.   Eyes:  Negative for itching and visual disturbance.  Respiratory:  Negative for cough.   Cardiovascular:  Negative for chest pain, palpitations and leg swelling.  Gastrointestinal:  Negative for abdominal distention, blood in stool, diarrhea and nausea.  Genitourinary:  Negative for frequency and hematuria.  Musculoskeletal:  Negative for back pain, gait problem, joint swelling and neck pain.  Skin:  Negative for rash.  Neurological:  Negative for dizziness, tremors, speech difficulty and weakness.  Psychiatric/Behavioral:  Negative for agitation, dysphoric mood, sleep disturbance and suicidal ideas. The patient is not nervous/anxious.     Objective:  BP 124/70   Pulse 86   Temp 98.2 F (36.8 C) (Oral)   Ht 6' 7 (2.007 m)   SpO2 96%   BMI 24.11 kg/m   BP Readings from Last 3 Encounters:  07/06/23 124/70  02/23/23 136/80  12/01/22 120/78    Wt Readings from Last 3 Encounters:  02/23/23 214 lb (97.1 kg)  12/01/22 206 lb (93.4 kg)  08/20/22 207 lb (93.9 kg)    Physical Exam Constitutional:      General: He is not in acute  distress.    Appearance: He is well-developed. He is obese.     Comments: NAD   Eyes:     Conjunctiva/sclera: Conjunctivae normal.     Pupils: Pupils are equal, round, and reactive to light.   Neck:     Thyroid : No thyromegaly.     Vascular: No JVD.   Cardiovascular:     Rate and Rhythm: Normal rate and regular rhythm.     Heart sounds: Normal heart sounds. No murmur heard.    No friction rub. No gallop.  Pulmonary:     Effort: Pulmonary effort is normal. No respiratory distress.     Breath sounds: Normal breath sounds. No wheezing or rales.  Chest:     Chest wall: No tenderness.  Abdominal:     General: Bowel sounds are normal. There is no distension.     Palpations: Abdomen is soft. There is no mass.     Tenderness: There is no abdominal tenderness. There is no guarding or rebound.   Musculoskeletal:        General: No tenderness. Normal range of motion.     Cervical back: Normal range of motion.     Right lower leg: No edema.     Left lower leg: No edema.  Lymphadenopathy:     Cervical: No cervical adenopathy.   Skin:    General: Skin is warm and dry.     Findings: No rash.   Neurological:     Mental Status: He is alert and oriented to person, place, and time.     Cranial Nerves: No cranial nerve deficit.     Motor: No abnormal muscle tone.     Coordination: Coordination normal.     Gait: Gait normal.     Deep Tendon Reflexes: Reflexes are normal and symmetric.   Psychiatric:        Behavior: Behavior normal.        Thought Content: Thought content normal.        Judgment: Judgment normal.    SKs on skin   Lab Results  Component Value Date   WBC 6.5 05/25/2022   HGB 14.3 05/25/2022   HCT 41.5 05/25/2022   PLT CANCELED 08/20/2022   GLUCOSE 98 08/20/2022   CHOL 205 (H) 07/06/2023   TRIG 111 07/06/2023   HDL 61 07/06/2023   LDLDIRECT 142.0 08/12/2020   LDLCALC 122 (H) 07/06/2023   ALT 58 (H) 08/20/2022   AST 79 (H) 08/20/2022   NA 142 08/20/2022    K 4.1 08/20/2022   CL 106 08/20/2022   CREATININE 0.88 08/20/2022   BUN 8 08/20/2022   CO2 22 08/20/2022   TSH 1.35 07/06/2023   PSA 0.77 07/06/2023   HGBA1C 5.9 (H) 07/06/2023   MICROALBUR <0.7 08/11/2021    US  Abdomen Limited RUQ (LIVER/GB) Result Date: 01/13/2023 CLINICAL  DATA:  Cirrhosis EXAM: ULTRASOUND ABDOMEN LIMITED RIGHT UPPER QUADRANT COMPARISON:  MR abdomen 09/07/2022 FINDINGS: Gallbladder: Surgically absent Common bile duct: Diameter: 6 mm Liver: Increased echogenicity. No focal lesion. Portal vein is patent on color Doppler imaging with normal direction of blood flow towards the liver. Other: None. IMPRESSION: 1. Increased hepatic parenchymal echogenicity suggestive of steatosis. 2. No acute process. Electronically Signed   By: Jone Neither M.D.   On: 01/13/2023 10:17    Assessment & Plan:   Problem List Items Addressed This Visit     Anxiety state   Clonazepam  prn Public speaking anxiety  Potential benefits of a long term benzodiazepines  use as well as potential risks  and complications were explained to the patient and were aknowledged.      Essential hypertension   On Crestor       Actinic keratosis   Face, scalp.  Schedule appointment with dermatology.  Sun protection      Well adult exam - Primary   We discussed age appropriate health related issues, including available/recomended screening tests and vaccinations. We discussed a need for adhering to healthy diet and exercise. Labs were ordered to be later reviewed All questions were answered. Colon 2019 A cardiac CT scan for calcium  scoring:  A cardiac CT scan for calcium  scoring :   IMPRESSION: Coronary calcium  score of 0. This was 0 percentile for age and sex matched control.     Electronically Signed   By: Christoper Crafts   On: 10/11/2018 18:21       Relevant Orders   Urinalysis   Lipid panel (Completed)   TSH (Completed)   PSA (Completed)   RESOLVED: Uncontrolled type 2 diabetes mellitus  with hyperglycemia (HCC)   Lab Results  Component Value Date   HGBA1C 5.6 02/23/2023  Doing better       Relevant Orders   Hemoglobin A1c (Completed)   Low serum vitamin B12   Take B complex      Type 2 diabetes mellitus with obesity (HCC)   Check A1c      Liver cirrhosis (HCC)   [place] was seen at [facility] liver clinic on 6/10  08/2022 abdominal MRI IMPRESSION: 1. Multiple fluid signal cysts, including of the anterior left lobe liver corresponding to prior ultrasound finding. No suspicious liver lesion or contrast enhancement. No further follow-up or characterization is required. 2. Mild cirrhosis and hepatic steatosis. 3. Mild splenomegaly. No ascites. 4. Status post cholecystectomy. 5. Multiple benign fluid signal renal cysts, including of the midportion of the left kidney corresponding to prior ultrasound finding. No further follow-up or characterization is required.  Mild cirrhosis.  Pope has stopped drinking.  Eating well         No orders of the defined types were placed in this encounter.     Follow-up: Return in about 3 months (around 10/06/2023) for a follow-up visit.  Anitra Barn, MD

## 2023-07-06 NOTE — Assessment & Plan Note (Signed)
 On Crestor

## 2023-07-06 NOTE — Assessment & Plan Note (Signed)
We discussed age appropriate health related issues, including available/recomended screening tests and vaccinations. We discussed a need for adhering to healthy diet and exercise. Labs were ordered to be later reviewed . All questions were answered. Colon 2019 A cardiac CT scan for calcium scoring:  A cardiac CT scan for calcium scoring :  IMPRESSION: Coronary calcium score of 0. This was 0 percentile for age and sex matched control.   Electronically Signed   By: Ena Dawley   On: 10/11/2018 18:21

## 2023-07-06 NOTE — Assessment & Plan Note (Signed)
 Check A1c.

## 2023-07-07 LAB — HEMOGLOBIN A1C
Hgb A1c MFr Bld: 5.9 % — ABNORMAL HIGH (ref <5.7)
Mean Plasma Glucose: 123 mg/dL
eAG (mmol/L): 6.8 mmol/L

## 2023-07-07 LAB — LIPID PANEL
Cholesterol: 205 mg/dL — ABNORMAL HIGH (ref <200)
HDL: 61 mg/dL (ref >=40)
LDL Cholesterol (Calc): 122 mg/dL — ABNORMAL HIGH
Non-HDL Cholesterol (Calc): 144 mg/dL — ABNORMAL HIGH (ref <130)
Total CHOL/HDL Ratio: 3.4 (calc) (ref <5.0)
Triglycerides: 111 mg/dL (ref <150)

## 2023-07-07 LAB — PSA: PSA: 0.77 ng/mL (ref <=4.00)

## 2023-07-08 LAB — TSH: TSH: 1.35 m[IU]/L (ref 0.40–4.50)

## 2023-07-11 ENCOUNTER — Ambulatory Visit: Payer: Self-pay | Admitting: Internal Medicine

## 2023-07-11 NOTE — Assessment & Plan Note (Signed)
 Face, scalp.  Schedule appointment with dermatology.  Paulene Boron protection

## 2023-07-28 ENCOUNTER — Ambulatory Visit
Admission: RE | Admit: 2023-07-28 | Discharge: 2023-07-28 | Disposition: A | Discharge status: Home / Self Care | Source: Ambulatory Visit | Attending: Nurse Practitioner | Admitting: Nurse Practitioner

## 2023-07-28 DIAGNOSIS — Z9049 Acquired absence of other specified parts of digestive tract: Secondary | ICD-10-CM | POA: Diagnosis not present

## 2023-07-28 DIAGNOSIS — K7460 Unspecified cirrhosis of liver: Secondary | ICD-10-CM | POA: Diagnosis not present

## 2023-07-28 DIAGNOSIS — K7581 Nonalcoholic steatohepatitis (NASH): Secondary | ICD-10-CM

## 2023-07-28 DIAGNOSIS — K7469 Other cirrhosis of liver: Secondary | ICD-10-CM

## 2023-08-09 DIAGNOSIS — K7469 Other cirrhosis of liver: Secondary | ICD-10-CM | POA: Diagnosis not present

## 2023-08-09 DIAGNOSIS — Z860100 Personal history of colon polyps, unspecified: Secondary | ICD-10-CM | POA: Diagnosis not present

## 2023-08-09 DIAGNOSIS — K766 Portal hypertension: Secondary | ICD-10-CM | POA: Diagnosis not present

## 2023-08-09 DIAGNOSIS — K219 Gastro-esophageal reflux disease without esophagitis: Secondary | ICD-10-CM | POA: Diagnosis not present

## 2023-08-23 ENCOUNTER — Ambulatory Visit: Payer: BC Managed Care – PPO | Admitting: Internal Medicine

## 2023-08-23 ENCOUNTER — Encounter: Payer: Self-pay | Admitting: Internal Medicine

## 2023-08-23 VITALS — BP 138/82 | HR 92 | Ht 79.0 in | Wt 217.0 lb

## 2023-08-23 DIAGNOSIS — E119 Type 2 diabetes mellitus without complications: Secondary | ICD-10-CM | POA: Diagnosis not present

## 2023-08-23 DIAGNOSIS — E785 Hyperlipidemia, unspecified: Secondary | ICD-10-CM

## 2023-08-23 MED ORDER — PRECISION XTRA BLOOD GLUCOSE VI STRP: 1.0000 | ORAL_STRIP | Freq: Every day | 12 refills | Status: AC

## 2023-08-23 NOTE — Progress Notes (Unsigned)
 Name: Darrell Santos  Age/ Sex: 58 y.o., male   MRN/ DOB: [id number], 31-Jul-1965     PCP: Garald Karlynn GAILS, MD   Reason for Endocrinology Evaluation: Type 2 Diabetes Mellitus  Initial Endocrine Consultative Visit: 03/07/2019    PATIENT IDENTIFIER: Darrell Santos is a 58 y.o. male with a past medical history of HTN and T2DM. The patient has followed with Endocrinology clinic since 03/07/2019 for consultative assistance with management of his diabetes.   Ozempic  caused lightheadednesshypoglycemia 10/2022   DIABETIC HISTORY:  Darrell Santos was diagnosed with T2DM in 01/2019, he presented to the ED with symptomatic hyperglycemia . He was discharged on Metformin . He was on Repaglinide  for a short period of time but was discontinued in 02/2019 due to hypoglycemia.His hemoglobin A1c has ranged from 5.6% in 05/2019, peaking at 9.4% in 02/2019.   Rosuvastatin  started 07/2020  SUBJECTIVE:   During the last visit (02/10/2021): A1c 5.9 %    Today (08/23/2023): Darrell Santos is here for a follow up on diabetes management.  He checks his blood sugars occasionally.  He has been noted with low BG's on average once a month, he is symptomatic .  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  Weight stable Denies nausea, vomiting  Denies abdominal pain  Denies constipation or diarrhea   Continues with morton neuroma , surgery is being entertained   Grandfather passed at age 21 due to MI  Father passed away at age 15 with MI    HOME ENDOCRINE  REGIMEN:  Fenofibrate  160 mg daily     Statin:yes  ACE-I/ARB: yes    METER DOWNLOAD SUMMARY:Unable to download  46 -148 mg/dL     DIABETIC COMPLICATIONS: Microvascular complications:    Denies: CKD, retinopathy, neuropathy Last eye exam: Completed 07/05/2022   Macrovascular complications:    Denies: CAD, PVD, CVA     HISTORY:  Past Medical History:  Past Medical History:  Diagnosis Date   Allergic rhinitis    Allergy    Anxiety    Asthma    Diabetes mellitus without complication (HCC)    GERD (gastroesophageal reflux disease)    HTN (hypertension)    Hyperlipidemia    borderline   Rosacea    Past Surgical History:  Past Surgical History:  Procedure Laterality Date   CHOLECYSTECTOMY     COLONOSCOPY     NASAL SINUS SURGERY     TOTAL HIP ARTHROPLASTY     Social History:  reports that he has never smoked. He quit smokeless tobacco use about 26 years ago. He reports current alcohol use of about 30.0 standard drinks of alcohol per week. He reports that he does not use drugs. Family History:  Family History  Problem Relation Age of Onset   Hypertension Other    Hypertension Other    Diabetes Other        1st degree relative    Asthma Mother    Hypertension Father    Diabetes Father    Colon polyps Neg Hx    Colon cancer Neg Hx    Esophageal cancer Neg Hx    Rectal cancer Neg Hx    Stomach cancer Neg Hx      HOME MEDICATIONS: Allergies as of 08/23/2023       Reactions   Midazolam Shortness Of Breath   (Pre-op)- dificulty breathing with associated heaviness in  chest    Molds & Smuts Shortness Of Breath   Other Anaphylaxis   Cats and nuts   Dog Epithelium Itching   Pollen Extract         Medication List        Accurate as of August 23, 2023  3:12 PM. If you have any questions, ask your nurse or doctor.          albuterol  108 (90 Base) MCG/ACT inhaler Commonly known as: ProAir  HFA Inhale 2 puffs into the lungs every 4 (four) hours as needed for wheezing or shortness of breath.   Asmanex  (60 Metered Doses) 220 MCG/ACT inhaler Generic drug: mometasone Inhale 1 puff into the lungs 2 (two) times daily.   B Complex Plus Tabs Take 1 tablet by mouth daily.   Banophen  25 MG tablet Generic drug: diphenhydrAMINE  Take 2 tablets (50 mg total) by mouth every 4 (four)  hours as needed (allergic reaction).   benzonatate  100 MG capsule Commonly known as: TESSALON  Take 1 capsule (100 mg total) by mouth 3 (three) times daily as needed for cough.   clonazePAM  0.5 MG tablet Commonly known as: KLONOPIN  TAKE 1 TABLET DAILY AS NEEDED   EPINEPHrine  0.3 mg/0.3 mL Soaj injection Commonly known as: EPI-PEN Inject into the muscle.   escitalopram  10 MG tablet Commonly known as: LEXAPRO  Take 1 tablet (10 mg total) by mouth daily.   fenofibrate  160 MG tablet TAKE 1 TABLET DAILY   fluticasone  50 MCG/ACT nasal spray Commonly known as: FLONASE  Place 1 spray into both nostrils daily.   olmesartan -hydrochlorothiazide  20-12.5 MG tablet Commonly known as: BENICAR  HCT Take 1 tablet by mouth daily.   OT ULTRA/FASTTK CNTRL SOLN Soln 1 Bottle by Does not apply route as directed.   pantoprazole  40 MG tablet Commonly known as: PROTONIX  Take 1 tablet (40 mg total) by mouth daily.   PRECISION XTRA TEST STRIPS test strip Generic drug: glucose blood 1 each by Other route daily in the afternoon. Use as instructed   Precision Xtra-Glucose/Ketone Devi 1 Device by Does not apply route daily in the afternoon.   Qvar  RediHaler 80 MCG/ACT inhaler Generic drug: beclomethasone USE 1 INHALATION TWICE A DAY What changed: See the new instructions.   rosuvastatin  5 MG tablet Commonly known as: CRESTOR  TAKE 1 TABLET EVERY OTHER DAY   SM Nasal Decongestant Max St 30 MG tablet Generic drug: pseudoephedrine  Take 2 tablets (60 mg total) by mouth every 4 (four) hours as needed for congestion (allergic reaction).   traZODone 50 MG tablet Commonly known as: DESYREL Take 1 tablet by mouth at bedtime.         OBJECTIVE:   Vital Signs: BP 138/82 (BP Location: Left Arm, Patient Position: Sitting, Cuff Size: Normal)   Pulse 92   Ht 6' 7 (2.007 m)   Wt 217 lb (98.4 kg)   SpO2 97%   BMI 24.45 kg/m   Wt Readings from Last 3 Encounters:  08/23/23 217 lb (98.4 kg)   02/23/23 214 lb (97.1 kg)  12/01/22 206 lb (93.4 kg)     Exam: General: Pt appears well and is in NAD  Lungs: Clear to auscultation    Heart: RRR   Abdomen: soft, nontender  Extremities: No pretibial edema.  Neuro: MS is good with appropriate affect, pt is alert and Ox3        DM foot exam: 02/23/2023   The skin of the feet is intact without sores or ulcerations, pt with callous formation at the  medial aspect of the 1st MT joint The pedal pulses are 1+ on right and 1+ on left. The sensation is intact to a screening 5.07, 10 gram monofilament bilaterally       DATA REVIEWED:  Lab Results  Component Value Date   HGBA1C 5.9 (H) 07/06/2023   HGBA1C 5.6 02/23/2023   HGBA1C 5.9 (A) 08/20/2022    Latest Reference Range & Units 08/20/22 15:39  Sodium 134 - 144 mmol/L 142  Potassium 3.5 - 5.2 mmol/L 4.1  Chloride 96 - 106 mmol/L 106  CO2 20 - 29 mmol/L 22  Glucose 70 - 99 mg/dL 98  BUN 6 - 24 mg/dL 8  Creatinine 9.23 - 8.72 mg/dL 9.11  Calcium  8.7 - 10.2 mg/dL 9.4  BUN/Creatinine Ratio 9 - 20  9  eGFR >59 mL/min/1.73 100  Alkaline Phosphatase 44 - 121 IU/L 60  Albumin 3.8 - 4.9 g/dL 4.7  AST 0 - 40 IU/L 79 (H)  ALT 0 - 44 IU/L 58 (H)  Total Protein 6.0 - 8.5 g/dL 6.9  Total Bilirubin 0.0 - 1.2 mg/dL 0.6  GGT 0 - 65 IU/L 480 (H)    Latest Reference Range & Units 08/20/22 15:39  Globulin, Total 1.5 - 4.5 g/dL 2.2  Platelets [id number] CANCELED      ASSESSMENT / PLAN / RECOMMENDATIONS:   1) Type 2 Diabetes Mellitus, Optimally controlled, Without complications - Most recent A1c of 5.8%. Goal A1c < 7.0%.    -A1c continues to be at goal -He has tried Ozempic  but had to discontinue due to hypoglycemia and feeling lightheaded -He is not on metformin  -r    2) Diabetic complications:  Eye: Does not have known diabetic retinopathy. Up to date  Neuro/ Feet: Does not have known diabetic peripheral neuropathy. Renal: Patient does not have known baseline CKD. He is on  an ACEI/ARB at present.     3) Dyslipidemia:     -LDL above goal, TG acceptable - He had discontinued statin therapy - I did recommend restarting rosuvastatin  3 times a week, no contraindication with cirrhosis   Medication  Fenofibrate  160 mg daily  Rosuvastatin  5 mg 3 times a week   4) MASH/Cirrosis    -Patient to follow-up with [facility]     F/U in 6 months    I spent 25 minutes preparing to see the patient by review of recent labs, imaging and procedures, obtaining and reviewing separately obtained history, communicating with the patient, ordering medications, tests or procedures, and documenting clinical information in the EHR including the differential Dx, treatment, and any further evaluation and other management   Signed electronically by: Stefano Redgie Butts, MD  [facility] Endocrinology  [facility] [address] [place] [place], [place] [address] Phone: [phone] FAX: [phone]   CC: Garald Karlynn GAILS, MD [address] [place] [address] Phone: [phone]  Fax: [phone]  Return to Endocrinology clinic as below: Future Appointments  Date Time Provider Department Center  10/06/2023  2:40 PM Plotnikov, Karlynn GAILS, MD [facility] None

## 2023-08-24 ENCOUNTER — Ambulatory Visit: Payer: Self-pay | Admitting: Internal Medicine

## 2023-08-24 LAB — MICROALBUMIN / CREATININE URINE RATIO
Creatinine, Urine: 92 mg/dL (ref 20–320)
Microalb, Ur: <0.2 mg/dL

## 2023-08-30 ENCOUNTER — Encounter: Payer: Self-pay | Admitting: Internal Medicine

## 2023-08-31 ENCOUNTER — Other Ambulatory Visit: Payer: Self-pay

## 2023-08-31 MED ORDER — LANCETS 30G MISC: 11 refills | Status: DC

## 2023-08-31 NOTE — Telephone Encounter (Signed)
 Prescription sent

## 2023-09-02 ENCOUNTER — Encounter: Payer: Self-pay | Admitting: Podiatry

## 2023-09-02 ENCOUNTER — Ambulatory Visit: Admitting: Podiatry

## 2023-09-02 DIAGNOSIS — D3610 Benign neoplasm of peripheral nerves and autonomic nervous system, unspecified: Secondary | ICD-10-CM

## 2023-09-02 NOTE — Progress Notes (Signed)
  Subjective:  Patient ID: Darrell Santos, male    DOB: February 15, 1965,   MRN: [id number]  Chief Complaint  Patient presents with   Neuroma    I spoke to Dr. Joya about doing surgery for a Mortons Neuroma.  I got side tracked with an issue with my eye.  So, I would like to proceed with the surgery.    58 y.o. male presents for  follow-up of left seoncd interspace neuroma. Relates he did have some issues with his eye so his foot was put on pause but has had MRI and is here to discuss surgical intervention as planned previously. .  Denies any other pedal complaints. Denies n/v/f/c.   Past Medical History:  Diagnosis Date   Allergic rhinitis    Allergy    Anxiety    Asthma    Diabetes mellitus without complication (HCC)    GERD (gastroesophageal reflux disease)    HTN (hypertension)    Hyperlipidemia    borderline   Rosacea     Objective:  Physical Exam: Vascular: DP/PT pulses 2/4 bilateral. CFT <3 seconds. Normal hair growth on digits. No edema.  Skin. No lacerations or abrasions bilateral feet.  Musculoskeletal: MMT 5/5 bilateral lower extremities in DF, PF, Inversion and Eversion. Deceased ROM in DF of ankle joint. Tender to palpation between 2nd and 3rd metatarsals. Palpable mulders click noted.  Neurological: Sensation intact to light touch.   Assessment:   1. Neuroma        Plan:  Patient was evaluated and treated and all questions answered. Discussed neuroma and etiology with patient as well as capsulitis.  At this point discussed further treatment options and patient opted for  further imaging for possible surgery  MRI reviewed with patients and discussed findings. Do see some hypointensity around second interspace on MRI and concern for possible neuroma. Some mild inflammatory tissue as well.  Discussed treatment options including surgical intervention with patient. Discussed neruoma excision with patient in second interspace on left. Discussed perioperative  course. Patient is in agreement with plan and would like to proceed.  -Informed surgical risk consent was reviewed and read aloud to the patient.  I reviewed the films.  I have discussed my findings with the patient in great detail.  I have discussed all risks including but not limited to infection, stiffness, scarring, limp, disability, deformity, damage to blood vessels and nerves, numbness, poor healing, need for braces, arthritis, chronic pain, amputation, death.  All benefits and realistic expectations discussed in great detail.  I have made no promises as to the outcome.  I have provided realistic expectations.  I have offered the patient a 2nd opinion, which they have declined and assured me they preferred to proceed despite the risks. Post-op meds: Oxy 5/325 mg, zofran .      Asberry Joya, DPM

## 2023-09-08 ENCOUNTER — Encounter: Payer: Self-pay | Admitting: Internal Medicine

## 2023-09-09 ENCOUNTER — Other Ambulatory Visit: Payer: Self-pay

## 2023-09-09 MED ORDER — LANCETS 30G MISC: 11 refills | Status: AC

## 2023-09-23 ENCOUNTER — Telehealth: Payer: Self-pay | Admitting: Podiatry

## 2023-09-23 NOTE — Telephone Encounter (Signed)
 DOS- 10/18/2023  NEURECTOMY + BLOCK LT- 28080  [employer] EFFECTIVE DATE- 01/26/2023  DEDUCTIBLE- $0 REMAINING- $0 OOP- $3000 REMAINING- $8091.60 COINSURANCE- 0%/NO COPAY  PER [employer]BS WEBSITE, NO PRIOR AUTH IS REQUIRED FOR CPT CODE 71919.  DOCUMENTATION ATTACHED TO SURGERY CONSENT PACKET.

## 2023-09-23 NOTE — Telephone Encounter (Signed)
 SURGERY TYPE NOT FILED THROUGH [facility].

## 2023-10-06 ENCOUNTER — Ambulatory Visit: Admitting: Internal Medicine

## 2023-10-18 ENCOUNTER — Other Ambulatory Visit: Payer: Self-pay | Admitting: Podiatry

## 2023-10-18 DIAGNOSIS — M65972 Unspecified synovitis and tenosynovitis, left ankle and foot: Secondary | ICD-10-CM | POA: Diagnosis not present

## 2023-10-18 DIAGNOSIS — D3610 Benign neoplasm of peripheral nerves and autonomic nervous system, unspecified: Secondary | ICD-10-CM | POA: Diagnosis not present

## 2023-10-18 DIAGNOSIS — G5762 Lesion of plantar nerve, left lower limb: Secondary | ICD-10-CM | POA: Diagnosis not present

## 2023-10-18 DIAGNOSIS — G8918 Other acute postprocedural pain: Secondary | ICD-10-CM | POA: Diagnosis not present

## 2023-10-18 MED ORDER — OXYCODONE-ACETAMINOPHEN 5-325 MG PO TABS: 1.0000 | ORAL_TABLET | ORAL | 0 refills | Status: AC | PRN

## 2023-10-18 MED ORDER — ONDANSETRON HCL 4 MG PO TABS: 4.0000 mg | ORAL_TABLET | Freq: Three times a day (TID) | ORAL | 0 refills | Status: AC | PRN

## 2023-10-21 ENCOUNTER — Encounter: Payer: Self-pay | Admitting: Podiatry

## 2023-10-24 ENCOUNTER — Encounter: Payer: Self-pay | Admitting: Podiatry

## 2023-10-27 ENCOUNTER — Ambulatory Visit: Admitting: Podiatry

## 2023-10-27 ENCOUNTER — Encounter: Payer: Self-pay | Admitting: Podiatry

## 2023-10-27 DIAGNOSIS — Z9889 Other specified postprocedural states: Secondary | ICD-10-CM

## 2023-10-27 NOTE — Progress Notes (Signed)
 Subjective:  Patient ID: Darrell Santos, male    DOB: 03/20/1965,  MRN: [id number]  No chief complaint on file.   DOS: 10/18/23 Procedure: Left second interspace neuroma excision  58 y.o. male returns for POV#1. Relates doing well and managing pain   Review of Systems: Negative except as noted in the HPI. Denies N/V/F/Ch.  Past Medical History:  Diagnosis Date   Allergic rhinitis    Allergy    Anxiety    Asthma    Diabetes mellitus without complication (HCC)    GERD (gastroesophageal reflux disease)    HTN (hypertension)    Hyperlipidemia    borderline   Rosacea     Current Outpatient Medications:    albuterol  (PROAIR  HFA) 108 (90 Base) MCG/ACT inhaler, Inhale 2 puffs into the lungs every 4 (four) hours as needed for wheezing or shortness of breath., Disp: 56 g, Rfl: 3   B Complex-Folic Acid (B COMPLEX PLUS) TABS, Take 1 tablet by mouth daily., Disp: 100 tablet, Rfl: 3   beclomethasone (QVAR  REDIHALER) 80 MCG/ACT inhaler, USE 1 INHALATION TWICE A DAY (Patient taking differently: Inhale 1 puff into the lungs 2 (two) times daily.), Disp: 31.8 g, Rfl: 2   benzonatate  (TESSALON ) 100 MG capsule, Take 1 capsule (100 mg total) by mouth 3 (three) times daily as needed for cough., Disp: 30 capsule, Rfl: 0   Blood Glucose Calibration (OT ULTRA/FASTTK CNTRL SOLN) SOLN, 1 Bottle by Does not apply route as directed., Disp: 2 each, Rfl: 0   Blood Glucose/Ketone Monitor (PRECISION XTRA-GLUCOSE/KETONE) DEVI, 1 Device by Does not apply route daily in the afternoon., Disp: 1 each, Rfl: 0   clonazePAM  (KLONOPIN ) 0.5 MG tablet, TAKE 1 TABLET DAILY AS NEEDED, Disp: 90 tablet, Rfl: 1   diphenhydrAMINE  (BENADRYL ) 25 MG tablet, Take 2 tablets (50 mg total) by mouth every 4 (four) hours as needed (allergic reaction)., Disp: 100 tablet, Rfl: 0   EPINEPHrine  0.3 mg/0.3 mL IJ SOAJ injection, Inject into the muscle., Disp: , Rfl:    escitalopram  (LEXAPRO ) 10 MG tablet, Take 1 tablet (10 mg total) by  mouth daily., Disp: 90 tablet, Rfl: 3   fenofibrate  160 MG tablet, TAKE 1 TABLET DAILY, Disp: 90 tablet, Rfl: 3   fluticasone  (FLONASE ) 50 MCG/ACT nasal spray, Place 1 spray into both nostrils daily., Disp: 15.8 mL, Rfl: 0   glucose blood (PRECISION XTRA TEST STRIPS) test strip, 1 each by Other route daily in the afternoon. Use as instructed, Disp: 100 each, Rfl: 12   Lancets 30G MISC, Use to check blood sugars 1-2 times daily as needed., Disp: 100 each, Rfl: 11   mometasone (ASMANEX , 60 METERED DOSES,) 220 MCG/ACT inhaler, Inhale 1 puff into the lungs 2 (two) times daily., Disp: 180 each, Rfl: 3   olmesartan -hydrochlorothiazide  (BENICAR  HCT) 20-12.5 MG tablet, Take 1 tablet by mouth daily., Disp: 90 tablet, Rfl: 3   ondansetron  (ZOFRAN ) 4 MG tablet, Take 1 tablet (4 mg total) by mouth every 8 (eight) hours as needed for nausea or vomiting., Disp: 20 tablet, Rfl: 0   pantoprazole  (PROTONIX ) 40 MG tablet, Take 1 tablet (40 mg total) by mouth daily., Disp: 90 tablet, Rfl: 3   traZODone (DESYREL) 50 MG tablet, Take 1 tablet by mouth at bedtime. (Patient not taking: Reported on 09/02/2023), Disp: , Rfl:   Social History   Tobacco Use  Smoking Status Never  Smokeless Tobacco Former   Quit date: 06/10/1997    Allergies  Allergen Reactions   Midazolam Shortness Of  Breath    (Pre-op)- dificulty breathing with associated heaviness in chest    Molds & Smuts Shortness Of Breath   Other Anaphylaxis    Cats and nuts   Dog Epithelium Itching   Pollen Extract    Objective:  There were no vitals filed for this visit. There is no height or weight on file to calculate BMI. Constitutional Well developed. Well nourished.  Vascular Foot warm and well perfused. Capillary refill normal to all digits.   Neurologic Normal speech. Oriented to person, place, and time. Epicritic sensation to light touch grossly present bilaterally.  Dermatologic Skin healing well without signs of infection. Skin edges well  coapted without signs of infection.  Orthopedic: Tenderness to palpation noted about the surgical site.   Assessment:   1. Status post surgery    Plan:  Patient was evaluated and treated and all questions answered.  S/p foot surgery left -Progressing as expected post-operatively. -WB Status: WBAT in surgical shoe -Sutures: intact. -Medications: n/a -Foot redressed.  Return in 2 weeks for suture removal.   No follow-ups on file.

## 2023-11-02 ENCOUNTER — Ambulatory Visit: Admitting: Internal Medicine

## 2023-11-02 ENCOUNTER — Encounter: Payer: Self-pay | Admitting: Internal Medicine

## 2023-11-02 VITALS — BP 138/86 | HR 73 | Temp 98.4°F | Ht 79.0 in | Wt 216.0 lb

## 2023-11-02 DIAGNOSIS — E538 Deficiency of other specified B group vitamins: Secondary | ICD-10-CM | POA: Diagnosis not present

## 2023-11-02 DIAGNOSIS — F1090 Alcohol use, unspecified, uncomplicated: Secondary | ICD-10-CM

## 2023-11-02 DIAGNOSIS — E119 Type 2 diabetes mellitus without complications: Secondary | ICD-10-CM | POA: Diagnosis not present

## 2023-11-02 DIAGNOSIS — Z7984 Long term (current) use of oral hypoglycemic drugs: Secondary | ICD-10-CM

## 2023-11-02 DIAGNOSIS — R7989 Other specified abnormal findings of blood chemistry: Secondary | ICD-10-CM | POA: Diagnosis not present

## 2023-11-02 DIAGNOSIS — M79672 Pain in left foot: Secondary | ICD-10-CM

## 2023-11-02 NOTE — Progress Notes (Signed)
 Subjective:  Patient ID: Darrell Santos, male    DOB: [birth year]  Age: 58 y.o. MRN: [id number]  CC: No chief complaint on file.   HPI Darrell Santos presents for a 3 month f/u on diabetes, asthma, GERD Pt had L foot neuroma surgery   Outpatient Medications Prior to Visit  Medication Sig Dispense Refill   albuterol  (PROAIR  HFA) 108 (90 Base) MCG/ACT inhaler Inhale 2 puffs into the lungs every 4 (four) hours as needed for wheezing or shortness of breath. 56 g 3   B Complex-Folic Acid (B COMPLEX PLUS) TABS Take 1 tablet by mouth daily. 100 tablet 3   beclomethasone (QVAR  REDIHALER) 80 MCG/ACT inhaler USE 1 INHALATION TWICE A DAY (Patient taking differently: Inhale 1 puff into the lungs 2 (two) times daily.) 31.8 g 2   benzonatate  (TESSALON ) 100 MG capsule Take 1 capsule (100 mg total) by mouth 3 (three) times daily as needed for cough. 30 capsule 0   Blood Glucose Calibration (OT ULTRA/FASTTK CNTRL SOLN) SOLN 1 Bottle by Does not apply route as directed. 2 each 0   Blood Glucose/Ketone Monitor (PRECISION XTRA-GLUCOSE/KETONE) DEVI 1 Device by Does not apply route daily in the afternoon. 1 each 0   clonazePAM  (KLONOPIN ) 0.5 MG tablet TAKE 1 TABLET DAILY AS NEEDED 90 tablet 1   EPINEPHrine  0.3 mg/0.3 mL IJ SOAJ injection Inject into the muscle.     escitalopram  (LEXAPRO ) 10 MG tablet Take 1 tablet (10 mg total) by mouth daily. 90 tablet 3   fenofibrate  160 MG tablet TAKE 1 TABLET DAILY 90 tablet 3   fluticasone  (FLONASE ) 50 MCG/ACT nasal spray Place 1 spray into both nostrils daily. 15.8 mL 0   glucose blood (PRECISION XTRA TEST STRIPS) test strip 1 each by Other route daily in the afternoon. Use as instructed 100 each 12   Lancets 30G MISC Use to check blood sugars 1-2 times daily as needed. 100 each 11   mometasone (ASMANEX , 60 METERED DOSES,) 220 MCG/ACT inhaler Inhale 1 puff into the lungs 2 (two) times daily. 180 each 3   olmesartan -hydrochlorothiazide  (BENICAR  HCT) 20-12.5 MG  tablet Take 1 tablet by mouth daily. 90 tablet 3   ondansetron  (ZOFRAN ) 4 MG tablet Take 1 tablet (4 mg total) by mouth every 8 (eight) hours as needed for nausea or vomiting. 20 tablet 0   pantoprazole  (PROTONIX ) 40 MG tablet Take 1 tablet (40 mg total) by mouth daily. 90 tablet 3   traZODone (DESYREL) 50 MG tablet Take 1 tablet by mouth at bedtime. (Patient not taking: Reported on 11/10/2023)     diphenhydrAMINE  (BENADRYL ) 25 MG tablet Take 2 tablets (50 mg total) by mouth every 4 (four) hours as needed (allergic reaction). 100 tablet 0   No facility-administered medications prior to visit.    ROS: Review of Systems  Constitutional:  Negative for appetite change, fatigue and unexpected weight change.  HENT:  Negative for congestion, nosebleeds, sneezing, sore throat and trouble swallowing.   Eyes:  Negative for itching and visual disturbance.  Respiratory:  Negative for cough.   Cardiovascular:  Negative for chest pain, palpitations and leg swelling.  Gastrointestinal:  Negative for abdominal distention, blood in stool, diarrhea and nausea.  Genitourinary:  Negative for frequency and hematuria.  Musculoskeletal:  Positive for arthralgias and gait problem. Negative for joint swelling and neck pain.  Skin:  Negative for rash.  Neurological:  Negative for dizziness, tremors, speech difficulty and weakness.  Psychiatric/Behavioral:  Positive for sleep disturbance. Negative  for agitation, dysphoric mood and suicidal ideas. The patient is nervous/anxious.     Objective:  BP 138/86   Pulse 73   Temp 98.4 F (36.9 C) (Temporal)   Ht 6' 7 (2.007 m)   Wt 216 lb (98 kg)   SpO2 98%   BMI 24.33 kg/m   BP Readings from Last 3 Encounters:  11/02/23 138/86  08/23/23 138/82  07/06/23 124/70    Wt Readings from Last 3 Encounters:  11/02/23 216 lb (98 kg)  08/23/23 217 lb (98.4 kg)  02/23/23 214 lb (97.1 kg)    Physical Exam Constitutional:      General: He is not in acute  distress.    Appearance: He is well-developed. He is obese.     Comments: NAD  Eyes:     Conjunctiva/sclera: Conjunctivae normal.     Pupils: Pupils are equal, round, and reactive to light.  Neck:     Thyroid : No thyromegaly.     Vascular: No JVD.  Cardiovascular:     Rate and Rhythm: Normal rate and regular rhythm.     Heart sounds: Normal heart sounds. No murmur heard.    No friction rub. No gallop.  Pulmonary:     Effort: Pulmonary effort is normal. No respiratory distress.     Breath sounds: Normal breath sounds. No wheezing or rales.  Chest:     Chest wall: No tenderness.  Abdominal:     General: Bowel sounds are normal. There is no distension.     Palpations: Abdomen is soft. There is no mass.     Tenderness: There is no abdominal tenderness. There is no guarding or rebound.  Musculoskeletal:        General: Tenderness present. Normal range of motion.     Cervical back: Normal range of motion.  Lymphadenopathy:     Cervical: No cervical adenopathy.  Skin:    General: Skin is warm and dry.     Findings: No rash.  Neurological:     Mental Status: He is alert and oriented to person, place, and time.     Cranial Nerves: No cranial nerve deficit.     Motor: No abnormal muscle tone.     Coordination: Coordination normal.     Gait: Gait normal.     Deep Tendon Reflexes: Reflexes are normal and symmetric.  Psychiatric:        Behavior: Behavior normal.        Thought Content: Thought content normal.        Judgment: Judgment normal.     Pigmented lesions on the L forearm, forehead   Lab Results  Component Value Date   WBC 6.1 11/02/2023   HGB 14.5 11/02/2023   HCT 42.1 11/02/2023   PLT 179.0 11/02/2023   GLUCOSE 94 11/02/2023   CHOL 205 (H) 07/06/2023   TRIG 111 07/06/2023   HDL 61 07/06/2023   LDLDIRECT 142.0 08/12/2020   LDLCALC 122 (H) 07/06/2023   ALT 38 11/02/2023   AST 30 11/02/2023   NA 138 11/02/2023   K 4.0 11/02/2023   CL 101 11/02/2023    CREATININE 0.97 11/02/2023   BUN 14 11/02/2023   CO2 28 11/02/2023   TSH 1.35 07/06/2023   PSA 0.77 07/06/2023   HGBA1C 6.3 11/02/2023   MICROALBUR <0.2 08/23/2023    US  Abdomen Limited RUQ (LIVER/GB) Result Date: 07/28/2023 CLINICAL DATA:  Cirrhosis EXAM: ULTRASOUND ABDOMEN LIMITED RIGHT UPPER QUADRANT COMPARISON:  01/13/2023 FINDINGS: Gallbladder: Status post cholecystectomy Common bile  duct: Diameter: 6 mm Liver: Parenchymal echogenicity: Heterogeneous Contours: Normal Lesions: None Portal vein: Patent.  Hepatopetal flow Other: None. IMPRESSION: Increased heterogeneity of the hepatic parenchyma is consistent with cirrhosis. No focal hepatic lesion. Electronically Signed   By: Aliene Lloyd M.D.   On: 07/28/2023 17:02    Assessment & Plan:   Problem List Items Addressed This Visit     Alcohol use   Advised to abstain from alcohol completely      Elevated LFTs - Primary   Monitor LFT      Relevant Orders   CBC with Differential/Platelet (Completed)   Comprehensive metabolic panel with GFR (Completed)   Hemoglobin A1c (Completed)   Foot pain, left   Elevate leg post-op      Low serum vitamin B12   Take B complex      Relevant Orders   CBC with Differential/Platelet (Completed)   Comprehensive metabolic panel with GFR (Completed)   Hemoglobin A1c (Completed)   Type 2 diabetes mellitus without complication, without long-term current use of insulin (HCC)   F/u w/Dr Shamleffer On Metformin  - low dose      Relevant Orders   Hemoglobin A1c (Completed)      No orders of the defined types were placed in this encounter.     Follow-up: Return in about 6 months (around 05/02/2024) for a follow-up visit.  Marolyn Noel, MD

## 2023-11-02 NOTE — Assessment & Plan Note (Signed)
Take B complex

## 2023-11-02 NOTE — Assessment & Plan Note (Signed)
Monitor LFT 

## 2023-11-03 LAB — COMPREHENSIVE METABOLIC PANEL WITH GFR
ALT: 38 U/L (ref 0–53)
AST: 30 U/L (ref 0–37)
Albumin: 4.6 g/dL (ref 3.5–5.2)
Alkaline Phosphatase: 37 U/L — ABNORMAL LOW (ref 39–117)
BUN: 14 mg/dL (ref 6–23)
CO2: 28 meq/L (ref 19–32)
Calcium: 9.4 mg/dL (ref 8.4–10.5)
Chloride: 101 meq/L (ref 96–112)
Creatinine, Ser: 0.97 mg/dL (ref 0.40–1.50)
GFR: 85.83 mL/min (ref >60.00)
Glucose, Bld: 94 mg/dL (ref 70–99)
Potassium: 4 meq/L (ref 3.5–5.1)
Sodium: 138 meq/L (ref 135–145)
Total Bilirubin: 0.6 mg/dL (ref 0.2–1.2)
Total Protein: 7.4 g/dL (ref 6.0–8.3)

## 2023-11-03 LAB — CBC WITH DIFFERENTIAL/PLATELET
Basophils Absolute: 0 K/uL (ref 0.0–0.1)
Basophils Relative: 0.7 % (ref 0.0–3.0)
Eosinophils Absolute: 0.5 K/uL (ref 0.0–0.7)
Eosinophils Relative: 8.1 % — ABNORMAL HIGH (ref 0.0–5.0)
HCT: 42.1 % (ref 39.0–52.0)
Hemoglobin: 14.5 g/dL (ref 13.0–17.0)
Lymphocytes Relative: 31.8 % (ref 12.0–46.0)
Lymphs Abs: 1.9 K/uL (ref 0.7–4.0)
MCHC: 34.4 g/dL (ref 30.0–36.0)
MCV: 91.9 fl (ref 78.0–100.0)
Monocytes Absolute: 0.5 K/uL (ref 0.1–1.0)
Monocytes Relative: 8.4 % (ref 3.0–12.0)
Neutro Abs: 3.1 K/uL (ref 1.4–7.7)
Neutrophils Relative %: 51 % (ref 43.0–77.0)
Platelets: 179 K/uL (ref 150.0–400.0)
RBC: 4.58 Mil/uL (ref 4.22–5.81)
RDW: 13.3 % (ref 11.5–15.5)
WBC: 6.1 K/uL (ref 4.0–10.5)

## 2023-11-03 LAB — HEMOGLOBIN A1C: Hgb A1c MFr Bld: 6.3 % (ref 4.6–6.5)

## 2023-11-07 ENCOUNTER — Ambulatory Visit: Payer: Self-pay | Admitting: Internal Medicine

## 2023-11-10 ENCOUNTER — Ambulatory Visit (INDEPENDENT_AMBULATORY_CARE_PROVIDER_SITE_OTHER): Admitting: Podiatry

## 2023-11-10 ENCOUNTER — Encounter: Payer: Self-pay | Admitting: Podiatry

## 2023-11-10 DIAGNOSIS — Z9889 Other specified postprocedural states: Secondary | ICD-10-CM

## 2023-11-10 DIAGNOSIS — D3610 Benign neoplasm of peripheral nerves and autonomic nervous system, unspecified: Secondary | ICD-10-CM

## 2023-11-10 NOTE — Progress Notes (Signed)
 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  It does hurt occasionally, usually after I walk too much.  My pain level now is a zero.  I don't have any nausea, vomiting, chills or fever.    DOS: 10/18/23 Procedure: Left second interspace neuroma excision  58 y.o. male returns for POV#2. Relates doing well and managing pain Hurts if walks too much.   Review of Systems: Negative except as noted in the HPI. Denies N/V/F/Ch.  Past Medical History:  Diagnosis Date   Allergic rhinitis    Allergy    Anxiety    Asthma    Diabetes mellitus without complication (HCC)    GERD (gastroesophageal reflux disease)    HTN (hypertension)    Hyperlipidemia    borderline   Rosacea     Current Outpatient Medications:    albuterol  (PROAIR  HFA) 108 (90 Base) MCG/ACT inhaler, Inhale 2 puffs into the lungs every 4 (four) hours as needed for wheezing or shortness of breath., Disp: 56 g, Rfl: 3   B Complex-Folic Acid (B COMPLEX PLUS) TABS, Take 1 tablet by mouth daily., Disp: 100 tablet, Rfl: 3   beclomethasone (QVAR  REDIHALER) 80 MCG/ACT inhaler, USE 1 INHALATION TWICE A DAY (Patient taking differently: Inhale 1 puff into the lungs 2 (two) times daily.), Disp: 31.8 g, Rfl: 2   benzonatate  (TESSALON ) 100 MG capsule, Take 1 capsule (100 mg total) by mouth 3 (three) times daily as needed for cough., Disp: 30 capsule, Rfl: 0   Blood Glucose Calibration (OT ULTRA/FASTTK CNTRL SOLN) SOLN, 1 Bottle by Does not apply route as directed., Disp: 2 each, Rfl: 0   Blood Glucose/Ketone Monitor (PRECISION XTRA-GLUCOSE/KETONE) DEVI, 1 Device by Does not apply route daily in the afternoon., Disp: 1 each, Rfl: 0   clonazePAM  (KLONOPIN ) 0.5 MG tablet, TAKE 1 TABLET DAILY AS NEEDED, Disp: 90 tablet, Rfl: 1   EPINEPHrine  0.3 mg/0.3 mL IJ  SOAJ injection, Inject into the muscle., Disp: , Rfl:    escitalopram  (LEXAPRO ) 10 MG tablet, Take 1 tablet (10 mg total) by mouth daily., Disp: 90 tablet, Rfl: 3   fenofibrate  160 MG tablet, TAKE 1 TABLET DAILY, Disp: 90 tablet, Rfl: 3   fluticasone  (FLONASE ) 50 MCG/ACT nasal spray, Place 1 spray into both nostrils daily., Disp: 15.8 mL, Rfl: 0   glucose blood (PRECISION XTRA TEST STRIPS) test strip, 1 each by Other route daily in the afternoon. Use as instructed, Disp: 100 each, Rfl: 12   Lancets 30G MISC, Use to check blood sugars 1-2 times daily as needed., Disp: 100 each, Rfl: 11   mometasone (ASMANEX , 60 METERED DOSES,) 220 MCG/ACT inhaler, Inhale 1 puff into the lungs 2 (two) times daily., Disp: 180 each, Rfl: 3   olmesartan -hydrochlorothiazide  (BENICAR  HCT) 20-12.5 MG tablet, Take 1 tablet by mouth daily., Disp: 90 tablet, Rfl: 3   ondansetron  (ZOFRAN ) 4 MG tablet, Take 1 tablet (4 mg total) by mouth every 8 (eight) hours as needed for nausea or vomiting., Disp: 20 tablet, Rfl: 0   pantoprazole  (PROTONIX ) 40 MG tablet, Take 1 tablet (40 mg total) by mouth daily., Disp: 90 tablet, Rfl: 3   traZODone (DESYREL) 50 MG tablet, Take 1 tablet by mouth at bedtime. (Patient not taking: Reported on 11/10/2023), Disp: , Rfl:  Social History   Tobacco Use  Smoking Status Never  Smokeless Tobacco Former   Quit date: 06/10/1997    Allergies  Allergen Reactions   Midazolam Shortness Of Breath    (Pre-op)- dificulty breathing with associated heaviness in chest    Molds & Smuts Shortness Of Breath   Other Anaphylaxis    Cats and nuts   Dog Epithelium Itching   Pollen Extract    Objective:  There were no vitals filed for this visit. There is no height or weight on file to calculate BMI. Constitutional Well developed. Well nourished.  Vascular Foot warm and well perfused. Capillary refill normal to all digits.   Neurologic Normal speech. Oriented to person, place, and time. Epicritic  sensation to light touch grossly present bilaterally.  Dermatologic Skin healing well without signs of infection. Skin edges well coapted without signs of infection.  Orthopedic: Tenderness to palpation noted about the surgical site.   Assessment:   1. Status post surgery   2. Neuroma    Plan:  Patient was evaluated and treated and all questions answered.  S/p foot surgery left -Progressing as expected post-operatively. -WB Status: WBAT in regular shoes transition.  -Sutures: removed without incident -Medications: n/a -Foot redressed.  Return in 3 weeks for recheck  Return in about 3 weeks (around 12/01/2023) for post op.

## 2023-11-14 ENCOUNTER — Encounter: Payer: Self-pay | Admitting: Internal Medicine

## 2023-11-14 NOTE — Assessment & Plan Note (Signed)
 Advised to abstain from alcohol completely

## 2023-11-14 NOTE — Assessment & Plan Note (Signed)
 Elevate leg post-op

## 2023-11-14 NOTE — Assessment & Plan Note (Signed)
F/u w/Dr Shamleffer On Metformin - low dose

## 2023-12-01 ENCOUNTER — Ambulatory Visit (INDEPENDENT_AMBULATORY_CARE_PROVIDER_SITE_OTHER): Admitting: Podiatry

## 2023-12-01 ENCOUNTER — Encounter: Payer: Self-pay | Admitting: Podiatry

## 2023-12-01 DIAGNOSIS — D3610 Benign neoplasm of peripheral nerves and autonomic nervous system, unspecified: Secondary | ICD-10-CM

## 2023-12-01 DIAGNOSIS — Z9889 Other specified postprocedural states: Secondary | ICD-10-CM

## 2023-12-01 NOTE — Progress Notes (Signed)
 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  It does still hurt after I walk a period of time.  The pain level now is a zero.     DOS: 10/18/23 Procedure: Left second interspace neuroma excision  58 y.o. male returns for POV#3. Relates doing well and managing pain doing better but still sore.    Review of Systems: Negative except as noted in the HPI. Denies N/V/F/Ch.  Past Medical History:  Diagnosis Date   Allergic rhinitis    Allergy    Anxiety    Asthma    Diabetes mellitus without complication (HCC)    GERD (gastroesophageal reflux disease)    HTN (hypertension)    Hyperlipidemia    borderline   Rosacea     Current Outpatient Medications:    albuterol  (PROAIR  HFA) 108 (90 Base) MCG/ACT inhaler, Inhale 2 puffs into the lungs every 4 (four) hours as needed for wheezing or shortness of breath., Disp: 56 g, Rfl: 3   B Complex-Folic Acid (B COMPLEX PLUS) TABS, Take 1 tablet by mouth daily., Disp: 100 tablet, Rfl: 3   beclomethasone (QVAR  REDIHALER) 80 MCG/ACT inhaler, USE 1 INHALATION TWICE A DAY (Patient taking differently: Inhale 1 puff into the lungs 2 (two) times daily.), Disp: 31.8 g, Rfl: 2   benzonatate  (TESSALON ) 100 MG capsule, Take 1 capsule (100 mg total) by mouth 3 (three) times daily as needed for cough., Disp: 30 capsule, Rfl: 0   Blood Glucose Calibration (OT ULTRA/FASTTK CNTRL SOLN) SOLN, 1 Bottle by Does not apply route as directed., Disp: 2 each, Rfl: 0   Blood Glucose/Ketone Monitor (PRECISION XTRA-GLUCOSE/KETONE) DEVI, 1 Device by Does not apply route daily in the afternoon., Disp: 1 each, Rfl: 0   clonazePAM  (KLONOPIN ) 0.5 MG tablet, TAKE 1 TABLET DAILY AS NEEDED, Disp: 90 tablet, Rfl: 1   EPINEPHrine  0.3 mg/0.3 mL IJ SOAJ injection, Inject into the muscle., Disp: , Rfl:     escitalopram  (LEXAPRO ) 10 MG tablet, Take 1 tablet (10 mg total) by mouth daily., Disp: 90 tablet, Rfl: 3   fenofibrate  160 MG tablet, TAKE 1 TABLET DAILY, Disp: 90 tablet, Rfl: 3   fluticasone  (FLONASE ) 50 MCG/ACT nasal spray, Place 1 spray into both nostrils daily., Disp: 15.8 mL, Rfl: 0   glucose blood (PRECISION XTRA TEST STRIPS) test strip, 1 each by Other route daily in the afternoon. Use as instructed, Disp: 100 each, Rfl: 12   Lancets 30G MISC, Use to check blood sugars 1-2 times daily as needed., Disp: 100 each, Rfl: 11   mometasone (ASMANEX , 60 METERED DOSES,) 220 MCG/ACT inhaler, Inhale 1 puff into the lungs 2 (two) times daily., Disp: 180 each, Rfl: 3   olmesartan -hydrochlorothiazide  (BENICAR  HCT) 20-12.5 MG tablet, Take 1 tablet by mouth daily., Disp: 90 tablet, Rfl: 3   ondansetron  (ZOFRAN ) 4 MG tablet, Take 1 tablet (4 mg total) by mouth every 8 (eight) hours as needed for nausea or vomiting., Disp: 20 tablet, Rfl: 0   pantoprazole  (PROTONIX ) 40 MG tablet, Take 1 tablet (40 mg total) by mouth daily., Disp: 90 tablet, Rfl: 3   traZODone (DESYREL) 50 MG tablet, Take 1 tablet by mouth at bedtime. (Patient not taking: Reported on 12/01/2023), Disp: , Rfl:   Social History   Tobacco Use  Smoking Status Never  Smokeless Tobacco Former   Quit date: 06/10/1997    Allergies  Allergen Reactions   Midazolam Shortness Of Breath    (Pre-op)- dificulty breathing with associated heaviness in chest    Molds & Smuts Shortness Of Breath   Other Anaphylaxis    Cats and nuts   Dog Epithelium Itching   Pollen Extract    Objective:  There were no vitals filed for this visit. There is no height or weight on file to calculate BMI. Constitutional Well developed. Well nourished.  Vascular Foot warm and well perfused. Capillary refill normal to all digits.   Neurologic Normal speech. Oriented to person, place, and time. Epicritic sensation to light touch grossly present bilaterally.   Dermatologic Skin healing well without signs of infection. Skin edges well coapted without signs of infection.  Orthopedic: Tenderness to palpation noted about the surgical site.  Pathhology report: Neuroma present.  Assessment:   1. Status post surgery   2. Neuroma     Plan:  Patient was evaluated and treated and all questions answered.  S/p foot surgery left -Progressing as expected post-operatively. -WB Status: WBAT in regular shoes  -Medications: n/a  Return as needed. Discharged from surgical standpoint.   Return if symptoms worsen or fail to improve.

## 2024-01-09 ENCOUNTER — Other Ambulatory Visit: Payer: Self-pay | Admitting: Internal Medicine

## 2024-01-11 ENCOUNTER — Encounter: Payer: Self-pay | Admitting: Internal Medicine

## 2024-01-12 ENCOUNTER — Other Ambulatory Visit: Payer: Self-pay

## 2024-01-12 MED ORDER — QVAR REDIHALER 80 MCG/ACT IN AERB: 1.0000 | INHALATION_SPRAY | Freq: Two times a day (BID) | RESPIRATORY_TRACT | 2 refills | Status: AC

## 2024-01-12 MED ORDER — ALBUTEROL SULFATE HFA 108 (90 BASE) MCG/ACT IN AERS: 2.0000 | INHALATION_SPRAY | RESPIRATORY_TRACT | 3 refills | Status: AC | PRN

## 2024-01-12 MED ORDER — ASMANEX (60 METERED DOSES) 220 MCG/ACT IN AEPB: 1.0000 | INHALATION_SPRAY | Freq: Two times a day (BID) | RESPIRATORY_TRACT | 3 refills | Status: AC

## 2024-01-16 ENCOUNTER — Telehealth: Admitting: Physician Assistant

## 2024-01-16 DIAGNOSIS — Z20828 Contact with and (suspected) exposure to other viral communicable diseases: Secondary | ICD-10-CM

## 2024-01-16 DIAGNOSIS — R6889 Other general symptoms and signs: Secondary | ICD-10-CM

## 2024-01-16 MED ORDER — BENZONATATE 100 MG PO CAPS: 100.0000 mg | ORAL_CAPSULE | Freq: Three times a day (TID) | ORAL | 0 refills | Status: DC | PRN

## 2024-01-16 MED ORDER — OSELTAMIVIR PHOSPHATE 75 MG PO CAPS: 75.0000 mg | ORAL_CAPSULE | Freq: Two times a day (BID) | ORAL | 0 refills | Status: AC

## 2024-01-16 NOTE — Progress Notes (Signed)
 E visit for Flu like symptoms   We are sorry that you are not feeling well.  Here is how we plan to help! Based on what you have shared with me it looks like you may have a respiratory virus that may be influenza.  Influenza or "the flu" is  an infection caused by a respiratory virus. The flu virus is highly contagious and persons who did not receive their yearly flu vaccination may "catch" the flu from close contact.  We have anti-viral medications to treat the viruses that cause this infection. They are not a "cure" and only shorten the course of the infection. These prescriptions are most effective when they are given within the first 2 days of "flu" symptoms. Antiviral medications are indicated if you have a high risk of complications from the flu. You should  also consider an antiviral medication if you are in close contact with someone who is at risk. These medications can help patients avoid complications from the flu but have side effects that you should know.   Possible side effects from Tamiflu or oseltamivir include nausea, vomiting, diarrhea, dizziness, headaches, eye redness, sleep problems or other respiratory symptoms. You should not take Tamiflu if you have an allergy to oseltamivir or any to the ingredients in Tamiflu.  Based upon your symptoms and potential risk factors I have prescribed Oseltamivir (Tamiflu).  It has been sent to your designated pharmacy.  You will take one 75 mg capsule orally twice a day for the next 5 days.   For nasal congestion, you may use an oral decongestant such as Mucinex  D or if you have glaucoma or high blood pressure use plain Mucinex .  Saline nasal spray or nasal drops can help and can safely be used as often as needed for congestion.  If you have a sore or scratchy throat, use a saltwater gargle-  to  teaspoon of salt dissolved in a 4-ounce to 8-ounce glass of warm water.  Gargle the solution for approximately 15-30 seconds and then spit.  It is  important not to swallow the solution.  You can also use throat lozenges/cough drops and Chloraseptic spray to help with throat pain or discomfort.  Warm or cold liquids can also be helpful in relieving throat pain.  For headache, pain or general discomfort, you can use Ibuprofen  or Tylenol  as directed.   Some authorities believe that zinc sprays or the use of Echinacea may shorten the course of your symptoms.  I have prescribed the following medications to help lessen symptoms: I have prescribed Tessalon  Perles 100 mg. You may take 1-2 capsules every 8 hours as needed for cough  You are to isolate at home until you have been fever-free for at least 24 hours without a fever-reducing medication, and symptoms have been steadily improving for 24 hours.  If you must be around other household members who do not have symptoms, you need to make sure that both you and the family members are masking consistently with a high-quality mask.  If you note any worsening of symptoms despite treatment, please seek an in-person evaluation ASAP. If you note any significant shortness of breath or any chest pain, please seek ED evaluation. Please do not delay care!  ANYONE WHO HAS FLU SYMPTOMS SHOULD: Stay home. The flu is highly contagious and going out or to work exposes others! Be sure to drink plenty of fluids. Water is fine as well as fruit juices, sodas and electrolyte beverages. You may want to stay  away from caffeine or alcohol. If you are nauseated, try taking small sips of liquids. How do you know if you are getting enough fluid? Your urine should be a pale yellow or almost colorless. Get rest. Taking a steamy shower or using a humidifier may help nasal congestion and ease sore throat pain. Using a saline nasal spray works much the same way. Cough drops, hard candies and sore throat lozenges may ease your cough. Line up a caregiver. Have someone check on you regularly.  GET HELP RIGHT AWAY IF: You cannot  keep down liquids or your medications. You become short of breath Your fell like you are going to pass out or loose consciousness. Your symptoms persist after you have completed your treatment plan  MAKE SURE YOU  Understand these instructions. Will watch your condition. Will get help right away if you are not doing well or get worse.  Your e-visit answers were reviewed by a board certified advanced clinical practitioner to complete your personal care plan.  Depending on the condition, your plan could have included both over the counter or prescription medications.  If there is a problem please reply  once you have received a response from your provider.  Your safety is important to us .  If you have drug allergies check your prescription carefully.    You can use MyChart to ask questions about today's visit, request a non-urgent call back, or ask for a work or school excuse for 24 hours related to this e-Visit. If it has been greater than 24 hours you will need to follow up with your provider, or enter a new e-Visit to address those concerns.  You will get an e-mail in the next two days asking about your experience.  I hope that your e-visit has been valuable and will speed your recovery. Thank you for using e-visits.   I have spent 5 minutes in review of e-visit questionnaire, review and updating patient chart, medical decision making and response to patient.   Elsie Velma Lunger, PA-C

## 2024-01-21 DIAGNOSIS — J208 Acute bronchitis due to other specified organisms: Secondary | ICD-10-CM | POA: Diagnosis not present

## 2024-01-21 DIAGNOSIS — J101 Influenza due to other identified influenza virus with other respiratory manifestations: Secondary | ICD-10-CM | POA: Diagnosis not present

## 2024-01-21 DIAGNOSIS — B9689 Other specified bacterial agents as the cause of diseases classified elsewhere: Secondary | ICD-10-CM | POA: Diagnosis not present

## 2024-01-21 DIAGNOSIS — J4540 Moderate persistent asthma, uncomplicated: Secondary | ICD-10-CM | POA: Diagnosis not present

## 2024-02-06 ENCOUNTER — Other Ambulatory Visit: Payer: Self-pay | Admitting: Internal Medicine

## 2024-02-06 DIAGNOSIS — I10 Essential (primary) hypertension: Secondary | ICD-10-CM

## 2024-02-08 ENCOUNTER — Ambulatory Visit (INDEPENDENT_AMBULATORY_CARE_PROVIDER_SITE_OTHER)

## 2024-02-08 ENCOUNTER — Ambulatory Visit
Admission: RE | Admit: 2024-02-08 | Discharge: 2024-02-08 | Disposition: A | Discharge status: Home / Self Care | Attending: Physician Assistant | Admitting: Physician Assistant

## 2024-02-08 VITALS — BP 156/90 | HR 71 | Temp 98.0°F

## 2024-02-08 DIAGNOSIS — R6889 Other general symptoms and signs: Secondary | ICD-10-CM

## 2024-02-08 DIAGNOSIS — R052 Subacute cough: Secondary | ICD-10-CM

## 2024-02-08 DIAGNOSIS — Z20828 Contact with and (suspected) exposure to other viral communicable diseases: Secondary | ICD-10-CM

## 2024-02-08 DIAGNOSIS — R053 Chronic cough: Secondary | ICD-10-CM | POA: Diagnosis not present

## 2024-02-08 DIAGNOSIS — J189 Pneumonia, unspecified organism: Secondary | ICD-10-CM

## 2024-02-08 MED ORDER — AMOXICILLIN-POT CLAVULANATE 875-125 MG PO TABS: 1.0000 | ORAL_TABLET | Freq: Two times a day (BID) | ORAL | 0 refills | Status: AC

## 2024-02-08 MED ORDER — AZITHROMYCIN 250 MG PO TABS: 250.0000 mg | ORAL_TABLET | Freq: Every day | ORAL | 0 refills | Status: AC

## 2024-02-08 MED ORDER — BENZONATATE 100 MG PO CAPS: 100.0000 mg | ORAL_CAPSULE | Freq: Three times a day (TID) | ORAL | 0 refills | Status: AC | PRN

## 2024-02-08 NOTE — Discharge Instructions (Signed)
 Your x-ray is concerning for pneumonia.  Please start Augmentin  twice daily for 10 days and take azithromycin  as prescribed.  Use Tessalon  to help with your cough.  Continue your asthma medication as prescribed.  Make sure that you rest and drink plenty of fluid.  I recommend that you follow-up with your primary care in 2 to 4 weeks to have a repeat chest x-ray to make sure that the pneumonia goes away with appropriate treatment.  If at any point you have worsening symptoms including chest pain, shortness of breath, headache, high fever not responding to medication, generalized weakness you need to be seen immediately.

## 2024-02-08 NOTE — ED Provider Notes (Signed)
 " [facility]    CSN: [id number] Arrival date & time: 02/08/24  1329      History   Chief Complaint Chief Complaint  Patient presents with   Cough    Initally had influenza A which developed into bronchitis.  Lingering cough, congestion,... - Entered by patient    HPI Jaquon Gingerich is a 59 y.o. male.   Patient presents today with a 1 month history of persistent cough.  Reports that about 1 month ago he was diagnosed with influenza A and was treated with Tamiflu .  He does have a history of asthma and so was also given methylprednisolone .  He continued to have significant cough symptoms and so was seen by different urgent care on 01/21/2024 at which point they treated him with current steroids and Omnicef .  Despite this medication he continues to have symptoms including severe cough, sinus congestion.  Denies any fever, chest pain, shortness of breath, nausea, vomiting, diarrhea.  Denies additional sick contacts.  Reports that his symptoms were improved but never completely resolved.  He does have a history of diabetes but this is well-controlled with his A1c of 6.3% on 11/02/2023.  He has been using his maintenance asthma medication but has not required his rescue more frequently in the past several weeks.  He is concerned because the cough has not resolved.    Past Medical History:  Diagnosis Date   Allergic rhinitis    Allergy    Anxiety    Asthma    Diabetes mellitus without complication (HCC)    GERD (gastroesophageal reflux disease)    HTN (hypertension)    Hyperlipidemia    borderline   Rosacea     Patient Active Problem List   Diagnosis Date Noted   Liver cirrhosis (HCC) 12/26/2022   Splenomegaly 12/26/2022   Full thickness macular hole of left eye 05/17/2022   Lattice degeneration of left retina 01/11/2022   Nuclear sclerotic cataract of both eyes 01/11/2022   Retinal edema 01/11/2022   Type 2 diabetes mellitus with obesity 05/23/2021   Elevated LFTs  02/11/2021   Low serum vitamin B12 11/20/2020   Hypertriglyceridemia 09/05/2020   Lumbar back pain 08/27/2020   Disorder of upper airway 10/12/2019   Type 2 diabetes mellitus without complication, without long-term current use of insulin (HCC) 06/05/2019   Alcohol use 02/21/2019   Fatty liver 10/11/2018   Preop exam for internal medicine 09/01/2017   Foot pain, left 09/02/2016   Acquired trigger finger 03/16/2016   Dyslipidemia 09/10/2015   Restless leg syndrome 09/19/2014   Rash and nonspecific skin eruption 09/19/2014   History of total left hip replacement 08/21/2014   Avascular necrosis of bone of right hip (HCC) 08/05/2014   Inflamed seborrheic keratosis 06/03/2014   Drug reaction 04/02/2014   Itchy skin 04/02/2014   Enthesopathy of hip 01/30/2014   Possible exposure to STD 09/10/2013   Arthralgia 03/05/2013   Stress fracture of metatarsal due to multiple or repetitive stress 03/05/2013   Warts 09/27/2012   Well adult exam 08/16/2012   Actinic keratoses 08/16/2012   Food allergy 08/15/2012   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   Diarrhea 06/30/2010   Calculus of gallbladder 01/08/2010   RUQ PAIN 12/29/2009   Actinic keratosis 07/31/2007   Neoplasm of uncertain behavior of skin 06/21/2007   ABNORMAL GLUCOSE NEC 06/21/2007   ABNORMAL LIVER FUNCTION TESTS 06/21/2007   Anxiety state 11/25/2006   Essential hypertension 11/25/2006   Allergic rhinitis 11/25/2006   Asthma 11/25/2006  GERD 11/25/2006    Past Surgical History:  Procedure Laterality Date   CHOLECYSTECTOMY     COLONOSCOPY     NASAL SINUS SURGERY     TOTAL HIP ARTHROPLASTY         Home Medications    Prior to Admission medications  Medication Sig Start Date End Date Taking? Authorizing Provider  amoxicillin -clavulanate (AUGMENTIN ) 875-125 MG tablet Take 1 tablet by mouth every 12 (twelve) hours. 02/08/24  Yes Mirayah Wren K, PA-C  azithromycin  (ZITHROMAX ) 250 MG tablet Take 1 tablet (250 mg  total) by mouth daily. Take first 2 tablets together, then 1 every day until finished. 02/08/24  Yes Jayden Rudge, Rocky POUR, PA-C  albuterol  (PROAIR  HFA) 108 (90 Base) MCG/ACT inhaler Inhale 2 puffs into the lungs every 4 (four) hours as needed for wheezing or shortness of breath. 01/12/24   Plotnikov, Aleksei V, MD  B Complex-Folic Acid (B COMPLEX PLUS) TABS Take 1 tablet by mouth daily. 11/20/20   Plotnikov, Aleksei V, MD  beclomethasone (QVAR  REDIHALER) 80 MCG/ACT inhaler Inhale 1 puff into the lungs 2 (two) times daily. USE 1 INHALATION TWICE A DAY 01/12/24   Plotnikov, Aleksei V, MD  benzonatate  (TESSALON ) 100 MG capsule Take 1 capsule (100 mg total) by mouth 3 (three) times daily as needed for cough. 02/08/24   Brandy Zuba K, PA-C  Blood Glucose Calibration (OT ULTRA/FASTTK CNTRL SOLN) SOLN 1 Bottle by Does not apply route as directed. 06/22/19   Shamleffer, Ibtehal Jaralla, MD  Blood Glucose/Ketone Monitor (PRECISION XTRA-GLUCOSE/KETONE) DEVI 1 Device by Does not apply route daily in the afternoon. 02/23/23   Shamleffer, Ibtehal Jaralla, MD  clonazePAM  (KLONOPIN ) 0.5 MG tablet TAKE 1 TABLET DAILY AS NEEDED 02/12/23   Plotnikov, Aleksei V, MD  EPINEPHrine  0.3 mg/0.3 mL IJ SOAJ injection Inject into the muscle. 12/18/20   [provider]  escitalopram  (LEXAPRO ) 10 MG tablet TAKE 1 TABLET DAILY 01/09/24   Webb, Padonda B, FNP  fenofibrate  160 MG tablet TAKE 1 TABLET DAILY 04/26/23   Shamleffer, Ibtehal Jaralla, MD  fluticasone  (FLONASE ) 50 MCG/ACT nasal spray Place 1 spray into both nostrils daily. 10/12/19   Shamleffer, Ibtehal Jaralla, MD  glucose blood (PRECISION XTRA TEST STRIPS) test strip 1 each by Other route daily in the afternoon. Use as instructed 08/23/23   Shamleffer, Ibtehal Jaralla, MD  Lancets 30G MISC Use to check blood sugars 1-2 times daily as needed. 09/09/23   Shamleffer, Ibtehal Jaralla, MD  mometasone (ASMANEX , 60 METERED DOSES,) 220 MCG/ACT inhaler Inhale 1 puff into the lungs 2  (two) times daily. 01/12/24   Plotnikov, Aleksei V, MD  olmesartan -hydrochlorothiazide  (BENICAR  HCT) 20-12.5 MG tablet TAKE 1 TABLET DAILY 02/06/24   Plotnikov, Aleksei V, MD  ondansetron  (ZOFRAN ) 4 MG tablet Take 1 tablet (4 mg total) by mouth every 8 (eight) hours as needed for nausea or vomiting. 10/18/23   Joya Stabs, DPM  pantoprazole  (PROTONIX ) 40 MG tablet TAKE 1 TABLET DAILY 01/09/24   Webb, Padonda B, FNP  traZODone (DESYREL) 50 MG tablet Take 1 tablet by mouth at bedtime. Patient not taking: Reported on 12/01/2023 02/14/23 05/15/23  [provider]    Family History Family History  Problem Relation Age of Onset   Hypertension Other    Hypertension Other    Diabetes Other        1st degree relative    Asthma Mother    Hypertension Father    Diabetes Father    Colon polyps Neg Hx  Colon cancer Neg Hx    Esophageal cancer Neg Hx    Rectal cancer Neg Hx    Stomach cancer Neg Hx     Social History Social History[1]   Allergies   Midazolam, Molds & smuts, Other, Dog epithelium, and Pollen extract   Review of Systems Review of Systems  Constitutional:  Positive for activity change. Negative for appetite change, fatigue and fever.  HENT:  Positive for congestion and postnasal drip. Negative for sinus pressure, sneezing and sore throat.   Respiratory:  Positive for cough. Negative for shortness of breath.   Cardiovascular:  Negative for chest pain.  Gastrointestinal:  Negative for abdominal pain, diarrhea, nausea and vomiting.     Physical Exam Triage Vital Signs ED Triage Vitals  Encounter Vitals Group     BP 02/08/24 1442 (!) 156/90     Girls Systolic BP Percentile --      Girls Diastolic BP Percentile --      Boys Systolic BP Percentile --      Boys Diastolic BP Percentile --      Pulse Rate 02/08/24 1442 71     Resp --      Temp 02/08/24 1442 98 F (36.7 C)     Temp Source 02/08/24 1442 Oral     SpO2 02/08/24 1442 96 %     Weight --       Height --      Head Circumference --      Peak Flow --      Pain Score 02/08/24 1444 0     Pain Loc --      Pain Education --      Exclude from Growth Chart --    No data found.  Updated Vital Signs BP (!) 156/90 (BP Location: Right Arm)   Pulse 71   Temp 98 F (36.7 C) (Oral)   SpO2 96%   Visual Acuity Right Eye Distance:   Left Eye Distance:   Bilateral Distance:    Right Eye Near:   Left Eye Near:    Bilateral Near:     Physical Exam Vitals reviewed.  Constitutional:      General: He is awake.     Appearance: Normal appearance. He is well-developed. He is not ill-appearing.     Comments: Very pleasant male appears stated age in no acute distress sitting comfortable in exam room  HENT:     Head: Normocephalic and atraumatic.     Right Ear: Tympanic membrane, ear canal and external ear normal. Tympanic membrane is not erythematous or bulging.     Left Ear: Tympanic membrane, ear canal and external ear normal. Tympanic membrane is not erythematous or bulging.     Nose: Nose normal.     Mouth/Throat:     Pharynx: Uvula midline. Postnasal drip present. No oropharyngeal exudate, posterior oropharyngeal erythema or uvula swelling.  Cardiovascular:     Rate and Rhythm: Normal rate and regular rhythm.     Heart sounds: Normal heart sounds, S1 normal and S2 normal. No murmur heard. Pulmonary:     Effort: Pulmonary effort is normal. No accessory muscle usage or respiratory distress.     Breath sounds: No stridor. Examination of the right-lower field reveals decreased breath sounds. Examination of the left-lower field reveals decreased breath sounds. Decreased breath sounds present. No wheezing, rhonchi or rales.  Neurological:     Mental Status: He is alert.  Psychiatric:        Behavior: Behavior is  cooperative.      UC Treatments / Results  Labs (all labs ordered are listed, but only abnormal results are displayed) Labs Reviewed - No data to  display  EKG   Radiology DG Chest 2 View Result Date: 02/08/2024 CLINICAL DATA:  Persistent cough EXAM: CHEST - 2 VIEW COMPARISON:  09/27/2019 FINDINGS: Frontal and lateral views of the chest demonstrate an unremarkable cardiac silhouette. Subtle increased linear opacities are seen within the right middle lobe, which could reflect airspace disease or atelectasis. No effusion or pneumothorax. No acute bony abnormalities. IMPRESSION: 1. Subtle opacities within the right middle lobe, which may reflect airspace disease or atelectasis. Electronically Signed   By: Ozell Daring M.D.   On: 02/08/2024 15:15    Procedures Procedures (including critical care time)  Medications Ordered in UC Medications - No data to display  Initial Impression / Assessment and Plan / UC Course  I have reviewed the triage vital signs and the nursing notes.  Pertinent labs & imaging results that were available during my care of the patient were reviewed by me and considered in my medical decision making (see chart for details).     Patient is well-appearing, afebrile, nontoxic, nontachycardic.  No negation for viral testing as he has been symptomatic for over a month this would not change our management.  Given his persistent cough chest x-ray was obtained that did show opacities in right middle lobe concerning for CAP.  Will treat with Augmentin  and azithromycin .  No indication for dose adjustment based on metabolic panel from 11/02/2023 with a creatinine of 0.97 and calculated creatinine clearance of 114 mL/min.  He was also given Tessalon  to help with his cough and recommended over-the-counter medication including Mucinex, Flonase , nasal saline/sinus rinses.  He is to return if he is not feeling significantly better within 3 to 5 days.  Recommend close follow-up with his primary care including repeat chest x-ray in 2 to 4 weeks to ensure that abnormalities on x-ray resolved with appropriate treatment.  We discussed that  if anything worsens and he has high fever, worsening cough, shortness of breath, chest pain, nausea/vomiting interferes oral intake he needs to be seen emergently.  Return precautions given.  Patient expressed understanding and agreement with treatment plan.  Final Clinical Impressions(s) / UC Diagnoses   Final diagnoses:  Subacute cough  Pneumonia of right middle lobe due to infectious organism     Discharge Instructions      Your x-ray is concerning for pneumonia.  Please start Augmentin  twice daily for 10 days and take azithromycin  as prescribed.  Use Tessalon  to help with your cough.  Continue your asthma medication as prescribed.  Make sure that you rest and drink plenty of fluid.  I recommend that you follow-up with your primary care in 2 to 4 weeks to have a repeat chest x-ray to make sure that the pneumonia goes away with appropriate treatment.  If at any point you have worsening symptoms including chest pain, shortness of breath, headache, high fever not responding to medication, generalized weakness you need to be seen immediately.    ED Prescriptions     Medication Sig Dispense Auth. Provider   amoxicillin -clavulanate (AUGMENTIN ) 875-125 MG tablet Take 1 tablet by mouth every 12 (twelve) hours. 20 tablet Mylee Falin K, PA-C   azithromycin  (ZITHROMAX ) 250 MG tablet Take 1 tablet (250 mg total) by mouth daily. Take first 2 tablets together, then 1 every day until finished. 6 tablet Kourtni Stineman K, PA-C  benzonatate  (TESSALON ) 100 MG capsule Take 1 capsule (100 mg total) by mouth 3 (three) times daily as needed for cough. 30 capsule Pricilla Moehle K, PA-C      PDMP not reviewed this encounter.    [1]  Social History Tobacco Use   Smoking status: Never   Smokeless tobacco: Former    Quit date: 06/10/1997  Vaping Use   Vaping status: Never Used  Substance Use Topics   Alcohol use: Yes    Alcohol/week: 30.0 standard drinks of alcohol    Types: 30 Standard drinks or  equivalent per week    Comment: 3-5 per day   Drug use: No     Sherrell Rocky POUR, PA-C 02/08/24 1535  "

## 2024-02-08 NOTE — ED Triage Notes (Signed)
 Pt states he had the flu about 1 month ago and had lingering cough. Treated by pcp about 2 weeks ago with abx and steroids and states cough improved some but is still coughing at times and feels fatigued. He noticed some pain in his back with deep coughing over the last few days.

## 2024-02-24 ENCOUNTER — Encounter: Payer: Self-pay | Admitting: Internal Medicine

## 2024-02-24 ENCOUNTER — Ambulatory Visit: Admitting: Internal Medicine

## 2024-02-24 VITALS — BP 136/80 | Ht 79.0 in | Wt 217.0 lb

## 2024-02-24 DIAGNOSIS — E119 Type 2 diabetes mellitus without complications: Secondary | ICD-10-CM

## 2024-02-24 DIAGNOSIS — E785 Hyperlipidemia, unspecified: Secondary | ICD-10-CM

## 2024-02-24 LAB — POCT GLYCOSYLATED HEMOGLOBIN (HGB A1C): Hemoglobin A1C: 6.2 % — AB (ref 4.0–5.6)

## 2024-02-24 MED ORDER — FENOFIBRATE 160 MG PO TABS: 160.0000 mg | ORAL_TABLET | Freq: Every day | ORAL | 3 refills | Status: AC

## 2024-02-24 MED ORDER — ROSUVASTATIN CALCIUM 5 MG PO TABS: 5.0000 mg | ORAL_TABLET | ORAL | 3 refills | Status: AC

## 2024-02-24 NOTE — Patient Instructions (Signed)
 Take rosuvastatin  5 mg 3 days a week Continue fenofibrate  160 mg daily

## 2024-02-24 NOTE — Progress Notes (Unsigned)
 "     Name: Darrell Santos  Age/ Sex: 59 y.o., male   MRN/ DOB: [id number], August 25, 1965     PCP: Garald Karlynn GAILS, MD   Reason for Endocrinology Evaluation: Type 2 Diabetes Mellitus  Initial Endocrine Consultative Visit: 03/07/2019    PATIENT IDENTIFIER: Darrell Santos is a 59 y.o. male with a past medical history of HTN and T2DM. The patient has followed with Endocrinology clinic since 03/07/2019 for consultative assistance with management of his diabetes.   Ozempic  caused lightheadednesshypoglycemia 10/2022   DIABETIC HISTORY:  Mr. Slusher was diagnosed with T2DM in 01/2019, he presented to the ED with symptomatic hyperglycemia . He was discharged on Metformin . He was on Repaglinide  for a short period of time but was discontinued in 02/2019 due to hypoglycemia.His hemoglobin A1c has ranged from 5.6% in 05/2019, peaking at 9.4% in 02/2019.   Rosuvastatin  started 07/2020  SUBJECTIVE:   During the last visit (08/23/2023): A1c 5.8 %    Today (02/24/2024): Darrell Santos is here for a follow up on diabetes management.  He checks his blood sugars occasionally.  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  Abdominal ultrasound noted 8 mm left hepatic lobe lesion, steatosis and splenomegaly.  MRI of the abdomen with corresponding lesion diagnosed as a benign cyst with multiple other small cysts within the liver and splenomegaly. Serologic evaluation notable for alpha 1 antitrypsin deficiency carrier, fib-4 indicating advanced fibrosis  He was treated for influenza, complicated by right lobe pneumonia Continues with mild cough , no fever  Weight remains stable No nausea  No constipation or diarrhea   Continues with foot pain and numbness around the sx site      HOME ENDOCRINE  REGIMEN:  Fenofibrate  160 mg daily  Rosuvastatin  5 mg 3 times a week- not taking    Statin:yes  ACE-I/ARB: yes    METER DOWNLOAD SUMMARY:Unable to download   69 - 170 mg/dL     DIABETIC COMPLICATIONS: Microvascular complications:    Denies: CKD, retinopathy, neuropathy Last eye exam: Completed 2025   Macrovascular complications:    Denies: CAD, PVD, CVA    HISTORY:  Past Medical History:  Past Medical History:  Diagnosis Date   Allergic rhinitis    Allergy    Anxiety    Asthma    Diabetes mellitus without complication (HCC)    GERD (gastroesophageal reflux disease)    HTN (hypertension)    Hyperlipidemia    borderline   Rosacea    Past Surgical History:  Past Surgical History:  Procedure Laterality Date   CHOLECYSTECTOMY     COLONOSCOPY     NASAL SINUS SURGERY     TOTAL HIP ARTHROPLASTY     Social History:  reports that he has never smoked. He quit smokeless tobacco use about 26 years ago. He reports current alcohol use of about 30.0 standard drinks of alcohol per week. He reports that he does not use drugs. Family History:  Family History  Problem Relation Age of Onset   Hypertension Other    Hypertension Other    Diabetes Other        1st degree relative    Asthma Mother    Hypertension Father    Diabetes Father    Colon  polyps Neg Hx    Colon cancer Neg Hx    Esophageal cancer Neg Hx    Rectal cancer Neg Hx    Stomach cancer Neg Hx      HOME MEDICATIONS: Allergies as of 02/24/2024       Reactions   Midazolam Shortness Of Breath   (Pre-op)- dificulty breathing with associated heaviness in chest    Molds & Smuts Shortness Of Breath   Other Anaphylaxis   Cats and nuts   Dog Epithelium Itching   Pollen Extract         Medication List        Accurate as of February 24, 2024  2:44 PM. If you have any questions, ask your nurse or doctor.          albuterol  108 (90 Base) MCG/ACT inhaler Commonly known as:  ProAir  HFA Inhale 2 puffs into the lungs every 4 (four) hours as needed for wheezing or shortness of breath.   amoxicillin -clavulanate 875-125 MG tablet Commonly known as: AUGMENTIN  Take 1 tablet by mouth every 12 (twelve) hours.   Asmanex  (60 Metered Doses) 220 MCG/ACT inhaler Generic drug: mometasone Inhale 1 puff into the lungs 2 (two) times daily.   azithromycin  250 MG tablet Commonly known as: ZITHROMAX  Take 1 tablet (250 mg total) by mouth daily. Take first 2 tablets together, then 1 every day until finished.   B Complex Plus Tabs Take 1 tablet by mouth daily.   benzonatate  100 MG capsule Commonly known as: TESSALON  Take 1 capsule (100 mg total) by mouth 3 (three) times daily as needed for cough.   clonazePAM  0.5 MG tablet Commonly known as: KLONOPIN  TAKE 1 TABLET DAILY AS NEEDED   EPINEPHrine  0.3 mg/0.3 mL Soaj injection Commonly known as: EPI-PEN Inject into the muscle.   escitalopram  10 MG tablet Commonly known as: LEXAPRO  TAKE 1 TABLET DAILY   fenofibrate  160 MG tablet TAKE 1 TABLET DAILY   fluticasone  50 MCG/ACT nasal spray Commonly known as: FLONASE  Place 1 spray into both nostrils daily.   Lancets 30G Misc Use to check blood sugars 1-2 times daily as needed.   olmesartan -hydrochlorothiazide  20-12.5 MG tablet Commonly known as: BENICAR  HCT TAKE 1 TABLET DAILY   ondansetron  4 MG tablet Commonly known as: Zofran  Take 1 tablet (4 mg total) by mouth every 8 (eight) hours as needed for nausea or vomiting.   OT ULTRA/FASTTK CNTRL SOLN Soln 1 Bottle by Does not apply route as directed.   pantoprazole  40 MG tablet Commonly known as: PROTONIX  TAKE 1 TABLET DAILY   PRECISION XTRA TEST STRIPS test strip Generic drug: glucose blood 1 each by Other route daily in the afternoon. Use as instructed   Precision Xtra-Glucose/Ketone Devi 1 Device by Does not apply route daily in the afternoon.   Qvar  RediHaler 80 MCG/ACT inhaler Generic drug:  beclomethasone Inhale 1 puff into the lungs 2 (two) times daily. USE 1 INHALATION TWICE A DAY   traZODone 50 MG tablet Commonly known as: DESYREL Take 1 tablet by mouth at bedtime.         OBJECTIVE:   Vital Signs: Ht 6' 7 (2.007 m)   BMI 24.33 kg/m   Wt Readings from Last 3 Encounters:  11/02/23 216 lb (98 kg)  08/23/23 217 lb (98.4 kg)  02/23/23 214 lb (97.1 kg)     Exam: General: Pt appears well and is in NAD  Lungs: Clear to auscultation    Heart: RRR   Extremities: No pretibial edema.  Neuro: MS is good with appropriate affect, pt is alert and Ox3        DM foot exam: 09/02/2023 per podiatry     DATA REVIEWED:  Lab Results  Component Value Date   HGBA1C 6.3 11/02/2023   HGBA1C 5.9 (H) 07/06/2023   HGBA1C 5.6 02/23/2023    Latest Reference Range & Units 11/02/23 16:28  Sodium 135 - 145 mEq/L 138  Potassium 3.5 - 5.1 mEq/L 4.0  Chloride 96 - 112 mEq/L 101  CO2 19 - 32 mEq/L 28  Glucose 70 - 99 mg/dL 94  BUN 6 - 23 mg/dL 14  Creatinine 9.59 - 8.49 mg/dL 9.02  Calcium  8.4 - 10.5 mg/dL 9.4  Alkaline Phosphatase 39 - 117 U/L 37 (L)  Albumin 3.5 - 5.2 g/dL 4.6  AST 0 - 37 U/L 30  ALT 0 - 53 U/L 38  Total Protein 6.0 - 8.3 g/dL 7.4  Total Bilirubin 0.2 - 1.2 mg/dL 0.6  GFR >39.99 mL/min 85.83    ASSESSMENT / PLAN / RECOMMENDATIONS:   1) Type 2 Diabetes Mellitus, Optimally controlled, Without complications - Most recent A1c of 6.2%. Goal A1c < 7.0%.    - A1c is remained  -He has tried Ozempic  but had to discontinue due to hypoglycemia and feeling lightheaded -He was on metformin  without intolerance but we discontinued due to optimal A1c and hypoglycemia      2) Diabetic complications:  Eye: Does not have known diabetic retinopathy. Up to date  Neuro/ Feet: Does not have known diabetic peripheral neuropathy. Renal: Patient does not have known baseline CKD. He is on an ACEI/ARB at present.     3) Dyslipidemia:     -Lipid panel has been above  goal - Upon our previous discussion the patient opted to  start rosuvastatin  due to strong family history of cardiovascular disease, but somehow he has not initiated that, patient would like to try rosuvastatin  - Historically he has had elevated LFTs with statin therapy, will monitor  Medication  Fenofibrate  160 mg daily  Rosuvastatin  5 mg 3 times a week   4) MASH/Cirrosis    -Patient to follow-up with [facility]     F/U in 6 months     Signed electronically by: Stefano Redgie Butts, MD  [facility] Endocrinology  [facility] [address] [place], [place] [address] Phone: [phone] FAX: [phone]   CC: Garald Karlynn GAILS, MD [address] [place] [address] Phone: [phone]  Fax: [phone]  Return to Endocrinology clinic as below: Future Appointments  Date Time Provider Department Center  02/29/2024  2:40 PM Plotnikov, Karlynn GAILS, MD [facility]  05/02/2024  3:40 PM Plotnikov, Karlynn GAILS, MD [facility]     "

## 2024-02-29 ENCOUNTER — Encounter: Payer: Self-pay | Admitting: Internal Medicine

## 2024-02-29 ENCOUNTER — Ambulatory Visit: Admitting: Internal Medicine

## 2024-02-29 VITALS — BP 136/76 | HR 88 | Ht 72.0 in | Wt 216.2 lb

## 2024-02-29 DIAGNOSIS — I10 Essential (primary) hypertension: Secondary | ICD-10-CM

## 2024-02-29 DIAGNOSIS — E538 Deficiency of other specified B group vitamins: Secondary | ICD-10-CM

## 2024-02-29 DIAGNOSIS — J189 Pneumonia, unspecified organism: Secondary | ICD-10-CM | POA: Insufficient documentation

## 2024-02-29 DIAGNOSIS — J4520 Mild intermittent asthma, uncomplicated: Secondary | ICD-10-CM

## 2024-02-29 NOTE — Assessment & Plan Note (Signed)
 02/08/24 RML  CAP treaded w/Augmentin  and a Zpac  f/u - he had Influenza A in 12/2023

## 2024-02-29 NOTE — Assessment & Plan Note (Signed)
 Check A1c.

## 2024-02-29 NOTE — Assessment & Plan Note (Signed)
Take B complex

## 2024-02-29 NOTE — Assessment & Plan Note (Signed)
 On Azmacort  twisthaler

## 2024-02-29 NOTE — Assessment & Plan Note (Signed)
 On Crestor 

## 2024-02-29 NOTE — Progress Notes (Unsigned)
 "  Subjective:  Patient ID: Darrell Santos, male    DOB: March 09, 1965  Age: 59 y.o. MRN: [id number]  CC: Pneumonia (Pnuemonia follow up. Noted at urgent care visit 01/14 and advised to check again with PCP)   HPI Darrell Santos presents for RML  CAP treaded w/Augmentin  and a Zpac  f/u - he had Influenza A  Outpatient Medications Prior to Visit  Medication Sig Dispense Refill   albuterol  (PROAIR  HFA) 108 (90 Base) MCG/ACT inhaler Inhale 2 puffs into the lungs every 4 (four) hours as needed for wheezing or shortness of breath. 56 g 3   amoxicillin -clavulanate (AUGMENTIN ) 875-125 MG tablet Take 1 tablet by mouth every 12 (twelve) hours. 20 tablet 0   azithromycin  (ZITHROMAX ) 250 MG tablet Take 1 tablet (250 mg total) by mouth daily. Take first 2 tablets together, then 1 every day until finished. 6 tablet 0   B Complex-Folic Acid (B COMPLEX PLUS) TABS Take 1 tablet by mouth daily. 100 tablet 3   beclomethasone (QVAR  REDIHALER) 80 MCG/ACT inhaler Inhale 1 puff into the lungs 2 (two) times daily. USE 1 INHALATION TWICE A DAY 31.8 g 2   benzonatate  (TESSALON ) 100 MG capsule Take 1 capsule (100 mg total) by mouth 3 (three) times daily as needed for cough. 30 capsule 0   Blood Glucose Calibration (OT ULTRA/FASTTK CNTRL SOLN) SOLN 1 Bottle by Does not apply route as directed. 2 each 0   Blood Glucose/Ketone Monitor (PRECISION XTRA-GLUCOSE/KETONE) DEVI 1 Device by Does not apply route daily in the afternoon. 1 each 0   clonazePAM  (KLONOPIN ) 0.5 MG tablet TAKE 1 TABLET DAILY AS NEEDED 90 tablet 1   EPINEPHrine  0.3 mg/0.3 mL IJ SOAJ injection Inject into the muscle.     escitalopram  (LEXAPRO ) 10 MG tablet TAKE 1 TABLET DAILY 90 tablet 3   fenofibrate  160 MG tablet Take 1 tablet (160 mg total) by mouth daily. 90 tablet 3   fluticasone  (FLONASE ) 50 MCG/ACT nasal spray Place 1 spray into both nostrils daily. 15.8 mL 0   glucose blood (PRECISION XTRA TEST STRIPS) test strip 1 each by Other  route daily in the afternoon. Use as instructed 100 each 12   Lancets 30G MISC Use to check blood sugars 1-2 times daily as needed. 100 each 11   mometasone (ASMANEX , 60 METERED DOSES,) 220 MCG/ACT inhaler Inhale 1 puff into the lungs 2 (two) times daily. 180 each 3   olmesartan -hydrochlorothiazide  (BENICAR  HCT) 20-12.5 MG tablet TAKE 1 TABLET DAILY 90 tablet 3   ondansetron  (ZOFRAN ) 4 MG tablet Take 1 tablet (4 mg total) by mouth every 8 (eight) hours as needed for nausea or vomiting. 20 tablet 0   pantoprazole  (PROTONIX ) 40 MG tablet TAKE 1 TABLET DAILY 90 tablet 3   rosuvastatin  (CRESTOR ) 5 MG tablet Take 1 tablet (5 mg total) by mouth as directed. 3 times a week 36 tablet 3   traZODone (DESYREL) 50 MG tablet Take 1 tablet by mouth at bedtime.     No facility-administered medications prior to visit.    ROS: Review of Systems  Constitutional:  Negative for appetite change, fatigue and unexpected weight change.  HENT:  Negative for congestion, nosebleeds, sneezing, sore throat and trouble swallowing.   Eyes:  Negative for itching and visual disturbance.  Respiratory:  Negative for cough.   Cardiovascular:  Negative for chest pain, palpitations and leg swelling.  Gastrointestinal:  Negative for abdominal distention, blood in stool, diarrhea and nausea.  Genitourinary:  Negative for  frequency and hematuria.  Musculoskeletal:  Negative for back pain, gait problem, joint swelling and neck pain.  Skin:  Negative for rash.  Neurological:  Negative for dizziness, tremors, speech difficulty and weakness.  Psychiatric/Behavioral:  Negative for agitation, dysphoric mood and sleep disturbance. The patient is not nervous/anxious.     Objective:  BP 136/76   Pulse 88   Ht 6' (1.829 m)   Wt 216 lb 3.2 oz (98.1 kg)   SpO2 98%   BMI 29.32 kg/m   BP Readings from Last 3 Encounters:  02/29/24 136/76  02/24/24 136/80  02/08/24 (!) 156/90    Wt Readings from Last 3 Encounters:   02/29/24 216 lb 3.2 oz (98.1 kg)  02/24/24 217 lb (98.4 kg)  11/02/23 216 lb (98 kg)    Physical Exam Constitutional:      General: He is not in acute distress.    Appearance: Normal appearance. He is well-developed.     Comments: NAD  Eyes:     Conjunctiva/sclera: Conjunctivae normal.     Pupils: Pupils are equal, round, and reactive to light.  Neck:     Thyroid : No thyromegaly.     Vascular: No JVD.  Cardiovascular:     Rate and Rhythm: Normal rate and regular rhythm.     Heart sounds: Normal heart sounds. No murmur heard.    No friction rub. No gallop.  Pulmonary:     Effort: Pulmonary effort is normal. No respiratory distress.     Breath sounds: Normal breath sounds. No wheezing or rales.  Chest:     Chest wall: No tenderness.  Abdominal:     General: Bowel sounds are normal. There is no distension.     Palpations: Abdomen is soft. There is no mass.     Tenderness: There is no abdominal tenderness. There is no guarding or rebound.  Musculoskeletal:        General: No tenderness. Normal range of motion.     Cervical back: Normal range of motion.  Lymphadenopathy:     Cervical: No cervical adenopathy.  Skin:    General: Skin is warm and dry.     Findings: No rash.  Neurological:     Mental Status: He is alert and oriented to person, place, and time.     Cranial Nerves: No cranial nerve deficit.     Motor: No abnormal muscle tone.     Coordination: Coordination normal.     Gait: Gait normal.     Deep Tendon Reflexes: Reflexes are normal and symmetric.  Psychiatric:        Behavior: Behavior normal.        Thought Content: Thought content normal.        Judgment: Judgment normal.     Lab Results  Component Value Date   WBC 6.1 11/02/2023   HGB 14.5 11/02/2023   HCT 42.1 11/02/2023   PLT 179.0 11/02/2023   GLUCOSE 94 11/02/2023   CHOL 205 (H) 07/06/2023   TRIG 111 07/06/2023   HDL 61 07/06/2023   LDLDIRECT 142.0 08/12/2020   LDLCALC 122 (H) 07/06/2023    ALT 38 11/02/2023   AST 30 11/02/2023   NA 138 11/02/2023   K 4.0 11/02/2023   CL 101 11/02/2023   CREATININE 0.97 11/02/2023   BUN 14 11/02/2023   CO2 28 11/02/2023   TSH 1.35 07/06/2023   PSA 0.77 07/06/2023   HGBA1C 6.2 (A) 02/24/2024   MICROALBUR <0.2 08/23/2023    DG Chest 2  View Result Date: 02/08/2024 CLINICAL DATA:  Persistent cough EXAM: CHEST - 2 VIEW COMPARISON:  09/27/2019 FINDINGS: Frontal and lateral views of the chest demonstrate an unremarkable cardiac silhouette. Subtle increased linear opacities are seen within the right middle lobe, which could reflect airspace disease or atelectasis. No effusion or pneumothorax. No acute bony abnormalities. IMPRESSION: 1. Subtle opacities within the right middle lobe, which may reflect airspace disease or atelectasis. Electronically Signed   By: Ozell Daring M.D.   On: 02/08/2024 15:15    Assessment & Plan:   Problem List Items Addressed This Visit     Essential hypertension - Primary   On Crestor       Asthma   On [place]ort  twisthaler      Relevant Orders   DG Chest 2 View   Low serum vitamin B12   Take B complex      CAP (community acquired pneumonia)   02/08/24 RML  CAP treaded w/Augmentin  and a Zpac  f/u - he had Influenza A in 12/2023      Relevant Orders   DG Chest 2 View      No orders of the defined types were placed in this encounter.     Follow-up: Return in about 6 months (around 08/28/2024) for a follow-up visit.  Marolyn Noel, MD "

## 2024-05-02 ENCOUNTER — Ambulatory Visit: Admitting: Internal Medicine
